# Patient Record
Sex: Male | Born: 1945
Health system: Southern US, Community
[De-identification: ages and names within clinical notes are randomized; demographics above are authoritative.]

## PROBLEM LIST (undated history)

## (undated) DIAGNOSIS — N400 Enlarged prostate without lower urinary tract symptoms: Secondary | ICD-10-CM

## (undated) DIAGNOSIS — T7840XA Allergy, unspecified, initial encounter: Secondary | ICD-10-CM

## (undated) DIAGNOSIS — L719 Rosacea, unspecified: Secondary | ICD-10-CM

## (undated) DIAGNOSIS — J300 Vasomotor rhinitis: Secondary | ICD-10-CM

## (undated) DIAGNOSIS — H905 Unspecified sensorineural hearing loss: Secondary | ICD-10-CM

## (undated) DIAGNOSIS — H269 Unspecified cataract: Secondary | ICD-10-CM

## (undated) DIAGNOSIS — M1990 Unspecified osteoarthritis, unspecified site: Secondary | ICD-10-CM

## (undated) DIAGNOSIS — I341 Nonrheumatic mitral (valve) prolapse: Secondary | ICD-10-CM

## (undated) DIAGNOSIS — M720 Palmar fascial fibromatosis [Dupuytren]: Secondary | ICD-10-CM

## (undated) DIAGNOSIS — Z9889 Other specified postprocedural states: Secondary | ICD-10-CM

## (undated) DIAGNOSIS — K219 Gastro-esophageal reflux disease without esophagitis: Secondary | ICD-10-CM

## (undated) DIAGNOSIS — R011 Cardiac murmur, unspecified: Secondary | ICD-10-CM

## (undated) DIAGNOSIS — E785 Hyperlipidemia, unspecified: Secondary | ICD-10-CM

## (undated) DIAGNOSIS — I340 Nonrheumatic mitral (valve) insufficiency: Principal | ICD-10-CM

## (undated) DIAGNOSIS — J45909 Unspecified asthma, uncomplicated: Secondary | ICD-10-CM

## (undated) DIAGNOSIS — H0013 Chalazion right eye, unspecified eyelid: Secondary | ICD-10-CM

## (undated) HISTORY — DX: Hyperlipidemia, unspecified: E78.5

## (undated) HISTORY — DX: Benign prostatic hyperplasia without lower urinary tract symptoms: N40.0

## (undated) HISTORY — PX: POLYPECTOMY: SHX149

## (undated) HISTORY — DX: Nonrheumatic mitral (valve) prolapse: I34.1

## (undated) HISTORY — DX: Unspecified asthma, uncomplicated: J45.909

## (undated) HISTORY — DX: Palmar fascial fibromatosis (dupuytren): M72.0

## (undated) HISTORY — DX: Unspecified cataract: H26.9

## (undated) HISTORY — PX: OTHER SURGICAL HISTORY: SHX169

## (undated) HISTORY — DX: Gastro-esophageal reflux disease without esophagitis: K21.9

## (undated) HISTORY — DX: Vasomotor rhinitis: J30.0

## (undated) HISTORY — DX: Chalazion right eye, unspecified eyelid: H00.13

## (undated) HISTORY — DX: Allergy, unspecified, initial encounter: T78.40XA

## (undated) HISTORY — DX: Nonrheumatic mitral (valve) insufficiency: I34.0

## (undated) HISTORY — DX: Unspecified sensorineural hearing loss: H90.5

## (undated) HISTORY — DX: Unspecified osteoarthritis, unspecified site: M19.90

---
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
## 1986-09-21 HISTORY — PX: NASAL SEPTUM SURGERY: SHX37

## 1992-09-21 HISTORY — PX: WISDOM TOOTH EXTRACTION: SHX21

## 1995-09-22 HISTORY — PX: OTHER SURGICAL HISTORY: SHX169

## 1996-09-21 HISTORY — PX: SEPTOPLASTY: SUR1290

## 1999-09-22 HISTORY — PX: HAND SURGERY: SHX662

## 2003-08-31 ENCOUNTER — Ambulatory Visit (HOSPITAL_COMMUNITY): Admission: RE | Admit: 2003-08-31 | Discharge: 2003-08-31 | Payer: Self-pay | Admitting: Urology

## 2003-08-31 ENCOUNTER — Ambulatory Visit (HOSPITAL_BASED_OUTPATIENT_CLINIC_OR_DEPARTMENT_OTHER): Admission: RE | Admit: 2003-08-31 | Discharge: 2003-08-31 | Payer: Self-pay | Admitting: Urology

## 2005-07-31 ENCOUNTER — Encounter: Admission: RE | Admit: 2005-07-31 | Discharge: 2005-07-31 | Payer: Self-pay | Admitting: Orthopedic Surgery

## 2005-09-10 ENCOUNTER — Encounter (INDEPENDENT_AMBULATORY_CARE_PROVIDER_SITE_OTHER): Payer: Self-pay | Admitting: *Deleted

## 2005-09-10 ENCOUNTER — Ambulatory Visit (HOSPITAL_COMMUNITY): Admission: RE | Admit: 2005-09-10 | Discharge: 2005-09-10 | Payer: Self-pay | Admitting: Orthopedic Surgery

## 2005-09-10 ENCOUNTER — Ambulatory Visit (HOSPITAL_BASED_OUTPATIENT_CLINIC_OR_DEPARTMENT_OTHER): Admission: RE | Admit: 2005-09-10 | Discharge: 2005-09-10 | Payer: Self-pay | Admitting: Orthopedic Surgery

## 2005-09-21 HISTORY — PX: COLONOSCOPY: SHX174

## 2006-05-17 ENCOUNTER — Ambulatory Visit: Payer: Self-pay | Admitting: Family Medicine

## 2006-05-31 ENCOUNTER — Ambulatory Visit: Payer: Self-pay | Admitting: Internal Medicine

## 2006-07-05 ENCOUNTER — Encounter: Payer: Self-pay | Admitting: Internal Medicine

## 2006-07-05 ENCOUNTER — Ambulatory Visit: Payer: Self-pay | Admitting: Internal Medicine

## 2006-07-05 LAB — HM COLONOSCOPY

## 2007-05-27 ENCOUNTER — Ambulatory Visit: Payer: Self-pay | Admitting: Family Medicine

## 2008-08-24 ENCOUNTER — Ambulatory Visit: Payer: Self-pay | Admitting: Family Medicine

## 2009-08-26 ENCOUNTER — Ambulatory Visit: Payer: Self-pay | Admitting: Family Medicine

## 2009-12-03 ENCOUNTER — Ambulatory Visit: Payer: Self-pay | Admitting: Family Medicine

## 2010-07-31 ENCOUNTER — Ambulatory Visit: Payer: Self-pay | Admitting: Family Medicine

## 2010-09-01 ENCOUNTER — Ambulatory Visit: Payer: Self-pay | Admitting: Family Medicine

## 2010-12-29 ENCOUNTER — Ambulatory Visit (INDEPENDENT_AMBULATORY_CARE_PROVIDER_SITE_OTHER): Payer: BC Managed Care – HMO | Admitting: Family Medicine

## 2010-12-29 DIAGNOSIS — N410 Acute prostatitis: Secondary | ICD-10-CM

## 2010-12-29 DIAGNOSIS — M545 Low back pain: Secondary | ICD-10-CM

## 2011-02-06 NOTE — Op Note (Signed)
NAME:  Andrew Ayala, Andrew Ayala             ACCOUNT NO.:  [id number]   MEDICAL RECORD NO.:  [id number]          PATIENT TYPE:  AMB   LOCATION:  [facility]                          FACILITY:  [facility]   PHYSICIAN:  Cindee Salt, M.D.       DATE OF BIRTH:  Jun 03, 1946   DATE OF PROCEDURE:  09/10/2005  DATE OF DISCHARGE:                                 OPERATIVE REPORT   PREOPERATIVE DIAGNOSIS:  Mass, right palm.   POSTOPERATIVE DIAGNOSIS:  Mass, right palm.   OPERATION PERFORMED:  Excision of mass, right palm.   SURGEON:  Cindee Salt, M.D.   ASSISTANT:  __________   ANESTHESIA:  __________  IV regional.   HISTORY:  The patient is a 65 year old male with a history of a mass in the  palm of his right hand.  This appeared solid during workup.  It is likely an  early Dupuytren's contracture.  He desires removal being aware of risks and  complications including injuries to arteries, nerves and tendons, the  possibility of exacerbation of a Dupuytren's if this is indeed a Dupuytren's  nodule.   DESCRIPTION OF THE OPERATION:  The patient was brought to the operating room  and his hand was marked by both the patient and the surgeon.  He was given  an __________  IV regional anesthetic.  The patient was prepped and draped  using DuraPrep in the supine position with the right arm free.  A volar  Brunner type incision was made over the mass.  This was carried down through  the subcutaneous tissue.  The mass was found to be in the palmar fascia.  This was quite large.  There was a small area of superficial nerve, which  entered into the mass.  This had to be sacrificed __________  be dissected  free.  The palmar fascia was isolated proximally.  This was transected.  This was also transected distally.  The mass involved the fascia going to  the ring and little fingers.  The neurovascular bundle to each finger was  protected.  The mass was excised in toto and sent to pathology.   No further lesions were  identified.  The wound was irrigated.  The skin was  closed with interrupted 5-0 nylon sutures.  A soft compressive dressing was  applied.   The patient tolerated the procedure well and was taken to the recovery room  for observation in satisfactory condition.  He will be discharged home on  Vicodin.  He is to return to the office in one week.           ______________________________  Cindee Salt, M.D.     GK/MEDQ  D:  09/10/2005  T:  09/12/2005  Job:  [id number]

## 2011-02-06 NOTE — Op Note (Signed)
NAME:  Andrew Ayala, Andrew Ayala                         ACCOUNT NO.:  [id number]   MEDICAL RECORD NO.:  [id number]                   PATIENT TYPE:  AMB   LOCATION:  [facility]                                 FACILITY:  [facility]   PHYSICIAN:  Bertram Millard. Dahlstedt, M.D.          DATE OF BIRTH:  1945/12/12   DATE OF PROCEDURE:  08/31/2003  DATE OF DISCHARGE:                                 OPERATIVE REPORT   PREOPERATIVE DIAGNOSIS:  Large penile condylomata.   POSTOPERATIVE DIAGNOSIS:  Large penile condylomata.   PRINCIPAL PROCEDURE:  CO2 laser of penile condylomata.   SURGEON:  Bertram Millard. Dahlstedt, M.D.   ANESTHESIA:  Local with MAC.   COMPLICATIONS:  None.   BRIEF HISTORY:  A 65 year old male, whom I treated several years ago for a  large cluster of condylomata on his penis.  He returned recently with  recurrence of these.  He has large, flat condylomatous area approximately  1.5 cm in diameter on the left proximal shaft of his penis.  As local  treatment with cryotherapy before failed, I recommended CO2 laser of this  area.  Risks and complications of this procedure were discussed with the  patient, who has desired to proceed.   DESCRIPTION OF PROCEDURE:  The patient was administered IV sedation, and  then in the area underneath the condylomata was infiltrated with a 50/50  solution of 0.5% plain Marcaine and 1% plain lidocaine.  Acetic acid was  then applied to the penis for a few minutes.  No other acetowhite lesions  were seen.  I then used the CO2 laser to ablate the skin lesions on the left  proximal penis.  There was a large lesion there that was also incorporated  in this cautery.  This was taken down to the subcutaneous layers with the  CO2 laser.  Several small bleeders were coagulated with the laser.  At this  point, after all the lesions were gone, the skin was dressed with Neosporin,  Vaseline gauze, and Kerlix wrap, and Coban.  The patient tolerated the  procedure well.  He was  awakened and taken to the PACU in stable condition.  He was sent home with dressing instructions as well as 15 Vicodin tablets 1  p.o. q.4h. p.r.n. pain.                                               Bertram Millard. Dahlstedt, M.D.    SMD/MEDQ  D:  08/31/2003  T:  08/31/2003  Job:  [id number]

## 2011-02-10 ENCOUNTER — Other Ambulatory Visit: Payer: Self-pay | Admitting: Family Medicine

## 2011-02-11 MED ORDER — VARDENAFIL HCL 20 MG PO TABS: 20.0000 mg | ORAL_TABLET | ORAL | Status: DC | PRN

## 2011-02-11 NOTE — Telephone Encounter (Signed)
Levitra renewed 

## 2011-04-12 ENCOUNTER — Other Ambulatory Visit: Payer: Self-pay | Admitting: Family Medicine

## 2011-07-11 ENCOUNTER — Other Ambulatory Visit: Payer: Self-pay | Admitting: Family Medicine

## 2011-07-13 ENCOUNTER — Other Ambulatory Visit: Payer: Self-pay | Admitting: Family Medicine

## 2011-07-13 MED ORDER — ATORVASTATIN CALCIUM 10 MG PO TABS: 10.0000 mg | ORAL_TABLET | Freq: Every day | ORAL | Status: DC

## 2011-07-13 NOTE — Telephone Encounter (Signed)
Phoned refilled Lipitor 10 mg   #30  2 refills to [facility] [phone]

## 2011-10-13 ENCOUNTER — Other Ambulatory Visit: Payer: Self-pay | Admitting: Family Medicine

## 2012-01-15 ENCOUNTER — Other Ambulatory Visit: Payer: Self-pay | Admitting: Family Medicine

## 2012-02-13 ENCOUNTER — Other Ambulatory Visit: Payer: Self-pay | Admitting: Family Medicine

## 2012-02-17 ENCOUNTER — Encounter: Payer: Self-pay | Admitting: Internal Medicine

## 2012-02-22 ENCOUNTER — Ambulatory Visit (INDEPENDENT_AMBULATORY_CARE_PROVIDER_SITE_OTHER): Payer: BC Managed Care – HMO | Admitting: Family Medicine

## 2012-02-22 ENCOUNTER — Encounter: Payer: Self-pay | Admitting: Internal Medicine

## 2012-02-22 ENCOUNTER — Encounter: Payer: Self-pay | Admitting: Family Medicine

## 2012-02-22 VITALS — BP 100/70 | HR 68 | Ht 75.0 in | Wt 194.0 lb

## 2012-02-22 DIAGNOSIS — Z860100 Personal history of colon polyps, unspecified: Secondary | ICD-10-CM | POA: Insufficient documentation

## 2012-02-22 DIAGNOSIS — J309 Allergic rhinitis, unspecified: Secondary | ICD-10-CM

## 2012-02-22 DIAGNOSIS — N400 Enlarged prostate without lower urinary tract symptoms: Secondary | ICD-10-CM | POA: Insufficient documentation

## 2012-02-22 DIAGNOSIS — I341 Nonrheumatic mitral (valve) prolapse: Secondary | ICD-10-CM | POA: Insufficient documentation

## 2012-02-22 DIAGNOSIS — I059 Rheumatic mitral valve disease, unspecified: Secondary | ICD-10-CM

## 2012-02-22 DIAGNOSIS — N529 Male erectile dysfunction, unspecified: Secondary | ICD-10-CM

## 2012-02-22 DIAGNOSIS — Z0000 Encounter for general adult medical examination without abnormal findings: Secondary | ICD-10-CM

## 2012-02-22 DIAGNOSIS — E785 Hyperlipidemia, unspecified: Secondary | ICD-10-CM

## 2012-02-22 LAB — POCT URINALYSIS DIPSTICK
Bilirubin, UA: NEGATIVE
Blood, UA: NEGATIVE
Glucose, UA: NEGATIVE
Ketones, UA: NEGATIVE
Leukocytes, UA: NEGATIVE
Nitrite, UA: NEGATIVE
Protein, UA: NEGATIVE
Spec Grav, UA: 1.02
Urobilinogen, UA: NEGATIVE
pH, UA: 7

## 2012-02-22 MED ORDER — FINASTERIDE 5 MG PO TABS: 5.0000 mg | ORAL_TABLET | Freq: Every day | ORAL | Status: DC

## 2012-02-22 MED ORDER — ATORVASTATIN CALCIUM 10 MG PO TABS: 10.0000 mg | ORAL_TABLET | Freq: Every day | ORAL | Status: DC

## 2012-02-22 NOTE — Progress Notes (Signed)
Subjective:    Patient ID: Andrew Ayala, male    DOB: [birth year], 66 y.o.   MRN: [id number]  HPI He is here for a complete examination. He does have a history of colonic polyps and does need a followup colonoscopy. He has had intermittent difficulty with erectile dysfunction but none recently. He has had difficulty with prostate symptoms especially with nocturia x4, hesitancy and decreased stream. His allergies seem to be under good control. He continues on his statin drug and is having no difficulty with that. He does have a history of MVP/MR and did see cardiology several years ago. He was told that followup would be on an as-needed basis. His immunizations were reviewed. He has no other concerns or questions.   Review of Systems Negative except as above    Objective:   Physical Exam BP 100/70  Pulse 68  Ht 6\' 3"  (1.905 m)  Wt 194 lb (87.998 kg)  BMI 24.25 kg/m2  General Appearance:    Alert, cooperative, no distress, appears stated age  Head:    Normocephalic, without obvious abnormality, atraumatic  Eyes:    PERRL, conjunctiva/corneas clear, EOM's intact, fundi    benign  Ears:    Normal TM's and external ear canals  Nose:   Nares normal, mucosa normal, no drainage or sinus   tenderness  Throat:   Lips, mucosa, and tongue normal; teeth and gums normal  Neck:   Supple, no lymphadenopathy;  thyroid:  no   enlargement/tenderness/nodules; no carotid   bruit or JVD  Back:    Spine nontender, no curvature, ROM normal, no CVA     tenderness  Lungs:     Clear to auscultation bilaterally without wheezes, rales or     ronchi; respirations unlabored  Chest Wall:    No tenderness or deformity   Heart:    Regular rate and rhythm, S1 and S2 normal, 3/6 systolic murmur with out S2 him in no rub or gallop   or gallop  Breast Exam:    No chest wall tenderness, masses or gynecomastia  Abdomen:     Soft, non-tender, nondistended, normoactive bowel sounds,    no masses, no hepatosplenomegaly    Genitalia:    Normal male external genitalia without lesions.  Testicles without masses.  No inguinal hernias.  Rectal:    Normal sphincter tone, no masses or tenderness; guaiac negative stool.  Prostate smooth, no nodules, not enlarged.  Extremities:   No clubbing, cyanosis or edema  Pulses:   2+ and symmetric all extremities  Skin:   Skin color, texture, turgor normal, no rashes or lesions  Lymph nodes:   Cervical, supraclavicular, and axillary nodes normal  Neurologic:   CNII-XII intact, normal strength, sensation and gait; reflexes 2+ and symmetric throughout          Psych:   Normal mood, affect, hygiene and grooming.           Assessment & Plan:   1. Routine general medical examination at a health care facility  POCT Urinalysis Dipstick, CBC with Differential, Comprehensive metabolic panel, Lipid panel  2. Hx of colonic polyps  [facility] COLONOSCOPY  3. ED (erectile dysfunction)    4. MVP (mitral valve prolapse)    5. Allergic rhinitis, mild    6. BPH (benign prostatic hyperplasia)  PSA, finasteride (PROSCAR) 5 MG tablet  7. Hyperlipidemia LDL goal < 100  Lipid panel, atorvastatin (LIPITOR) 10 MG tablet   he will call and let me know how the finasteride  is working for his bladder issues. I also discussed low possibility of further difficulty with erectile dysfunction.

## 2012-02-23 LAB — CBC WITH DIFFERENTIAL/PLATELET
Basophils Absolute: 0.1 10*3/uL (ref 0.0–0.1)
Basophils Relative: 1 % (ref 0–1)
Eosinophils Relative: 8 % — ABNORMAL HIGH (ref 0–5)
HCT: 44.1 % (ref 39.0–52.0)
Hemoglobin: 15 g/dL (ref 13.0–17.0)
Lymphocytes Relative: 37 % (ref 12–46)
Lymphs Abs: 2.2 10*3/uL (ref 0.7–4.0)
MCHC: 34 g/dL (ref 30.0–36.0)
MCV: 96.1 fL (ref 78.0–100.0)
Monocytes Absolute: 0.6 10*3/uL (ref 0.1–1.0)
Monocytes Relative: 10 % (ref 3–12)
Neutro Abs: 2.6 10*3/uL (ref 1.7–7.7)
Neutrophils Relative %: 44 % (ref 43–77)
Platelets: 168 10*3/uL (ref 150–400)
RBC: 4.59 MIL/uL (ref 4.22–5.81)
WBC: 5.9 10*3/uL (ref 4.0–10.5)

## 2012-02-23 LAB — LIPID PANEL
Cholesterol: 156 mg/dL (ref 0–200)
HDL: 37 mg/dL — ABNORMAL LOW (ref >39)
LDL Cholesterol: 83 mg/dL (ref 0–99)
Total CHOL/HDL Ratio: 4.2 Ratio
Triglycerides: 179 mg/dL — ABNORMAL HIGH (ref <150)
VLDL: 36 mg/dL (ref 0–40)

## 2012-02-23 LAB — COMPREHENSIVE METABOLIC PANEL
ALT: 26 U/L (ref 0–53)
AST: 25 U/L (ref 0–37)
Albumin: 4.3 g/dL (ref 3.5–5.2)
BUN: 15 mg/dL (ref 6–23)
CO2: 28 mEq/L (ref 19–32)
Calcium: 9.2 mg/dL (ref 8.4–10.5)
Chloride: 103 mEq/L (ref 96–112)
Creat: 1.02 mg/dL (ref 0.50–1.35)
Glucose, Bld: 79 mg/dL (ref 70–99)
Potassium: 4 mEq/L (ref 3.5–5.3)
Sodium: 142 mEq/L (ref 135–145)
Total Bilirubin: 0.8 mg/dL (ref 0.3–1.2)
Total Protein: 7.1 g/dL (ref 6.0–8.3)

## 2012-02-23 LAB — PSA: PSA: 0.41 ng/mL (ref <=4.00)

## 2012-02-23 NOTE — Progress Notes (Signed)
Quick Note:  The blood work is normal ______ 

## 2012-03-15 ENCOUNTER — Other Ambulatory Visit: Payer: Self-pay | Admitting: Family Medicine

## 2012-03-21 ENCOUNTER — Encounter: Payer: Self-pay | Admitting: Internal Medicine

## 2012-03-21 ENCOUNTER — Ambulatory Visit (AMBULATORY_SURGERY_CENTER): Payer: BC Managed Care – HMO | Admitting: *Deleted

## 2012-03-21 VITALS — Ht 75.0 in | Wt 193.1 lb

## 2012-03-21 DIAGNOSIS — Z1211 Encounter for screening for malignant neoplasm of colon: Secondary | ICD-10-CM

## 2012-03-21 MED ORDER — MOVIPREP 100 G PO SOLR: ORAL | Status: DC

## 2012-03-21 NOTE — Progress Notes (Signed)
No allergies to eggs or soybeans.  No complication of anesthesia; no difficult intubation  

## 2012-04-04 ENCOUNTER — Ambulatory Visit (AMBULATORY_SURGERY_CENTER): Payer: BC Managed Care – HMO | Admitting: Internal Medicine

## 2012-04-04 ENCOUNTER — Encounter: Payer: Self-pay | Admitting: Internal Medicine

## 2012-04-04 VITALS — BP 119/70 | HR 52 | Temp 95.9°F | Resp 16 | Ht 75.0 in | Wt 193.0 lb

## 2012-04-04 DIAGNOSIS — D126 Benign neoplasm of colon, unspecified: Secondary | ICD-10-CM

## 2012-04-04 DIAGNOSIS — Z1211 Encounter for screening for malignant neoplasm of colon: Secondary | ICD-10-CM

## 2012-04-04 DIAGNOSIS — Z86010 Personal history of colonic polyps: Secondary | ICD-10-CM

## 2012-04-04 MED ORDER — SODIUM CHLORIDE 0.9 % IV SOLN: 500.0000 mL | INTRAVENOUS | Status: DC

## 2012-04-04 NOTE — Patient Instructions (Addendum)
YOU HAD AN ENDOSCOPIC PROCEDURE TODAY AT THE [facility]: Refer to the procedure report that was given to you for any specific questions about what was found during the examination.  If the procedure report does not answer your questions, please call your gastroenterologist to clarify.  If you requested that your care partner not be given the details of your procedure findings, then the procedure report has been included in a sealed envelope for you to review at your convenience later.  YOU SHOULD EXPECT: Some feelings of bloating in the abdomen. Passage of more gas than usual.  Walking can help get rid of the air that was put into your GI tract during the procedure and reduce the bloating. If you had a lower endoscopy (such as a colonoscopy or flexible sigmoidoscopy) you may notice spotting of blood in your stool or on the toilet paper. If you underwent a bowel prep for your procedure, then you may not have a normal bowel movement for a few days.  DIET: Your first meal following the procedure should be a light meal and then it is ok to progress to your normal diet.  A half-sandwich or bowl of soup is an example of a good first meal.  Heavy or fried foods are harder to digest and may make you feel nauseous or bloated.  Likewise meals heavy in dairy and vegetables can cause extra gas to form and this can also increase the bloating.  Drink plenty of fluids but you should avoid alcoholic beverages for 24 hours.  ACTIVITY: Your care partner should take you home directly after the procedure.  You should plan to take it easy, moving slowly for the rest of the day.  You can resume normal activity the day after the procedure however you should NOT DRIVE or use heavy machinery for 24 hours (because of the sedation medicines used during the test).    SYMPTOMS TO REPORT IMMEDIATELY: A gastroenterologist can be reached at any hour.  During normal business hours, 8:30 AM to 5:00 PM Monday through Friday,  call [phone].  After hours and on weekends, please call the GI answering service at [phone] who will take a message and have the physician on call contact you.   Following lower endoscopy (colonoscopy or flexible sigmoidoscopy):  Excessive amounts of blood in the stool  Significant tenderness or worsening of abdominal pains  Swelling of the abdomen that is new, acute  Fever of 100F or higher    FOLLOW UP: If any biopsies were taken you will be contacted by phone or by letter within the next 1-3 weeks.  Call your gastroenterologist if you have not heard about the biopsies in 3 weeks.  Our staff will call the home number listed on your records the next business day following your procedure to check on you and address any questions or concerns that you may have at that time regarding the information given to you following your procedure. This is a courtesy call and so if there is no answer at the home number and we have not heard from you through the emergency physician on call, we will assume that you have returned to your regular daily activities without incident.  SIGNATURES/CONFIDENTIALITY: You and/or your care partner have signed paperwork which will be entered into your electronic medical record.  These signatures attest to the fact that that the information above on your After Visit Summary has been reviewed and is understood.  Full responsibility of the confidentiality   of this discharge information lies with you and/or your care-partner.     

## 2012-04-04 NOTE — Progress Notes (Signed)
Patient did not have preoperative order for IV antibiotic SSI prophylaxis. (G8918)  Patient did not experience any of the following events: a burn prior to discharge; a fall within the facility; wrong site/side/patient/procedure/implant event; or a hospital transfer or hospital admission upon discharge from the facility. (G8907)  

## 2012-04-04 NOTE — Op Note (Signed)
[facility] [address] [place], [place]  [address]  COLONOSCOPY PROCEDURE REPORT  PATIENT:  Andrew Ayala, Andrew Ayala  MR#:  [id number] BIRTHDATE:  05/17/1946, 65 yrs. old  GENDER:  male ENDOSCOPIST:  Wilhemina Bonito. Eda Keys, MD REF. BY:  Surveillance Program Recall, PROCEDURE DATE:  04/04/2012 PROCEDURE:  Colonoscopy with snare polypectomy x 1 ASA CLASS:  Class II INDICATIONS:  history of pre-cancerous (adenomatous) colon polyps, surveillance and high-risk screening ; index exam 06-2006 w/ small adenomas MEDICATIONS:   MAC sedation, administered by CRNA, propofol (Diprivan) 300 mg IV  DESCRIPTION OF PROCEDURE:   After the risks benefits and alternatives of the procedure were thoroughly explained, informed consent was obtained.  Digital rectal exam was performed and revealed no abnormalities.   The LB CF-[id number] endoscope was introduced through the anus and advanced to the cecum, which was identified by both the appendix and ileocecal valve, without limitations.  The quality of the prep was good, using MoviPrep. The instrument was then slowly withdrawn as the colon was fully examined. <<PROCEDUREIMAGES>>  FINDINGS:A diminutive polyp was found in the proximal transverse colon and was snared without cautery.Retrieval was successful. Otherwise normal colonoscopy without other polyps, masses, vascular ectasias, or inflammatory changes.  Retroflexed views in the rectum revealed no abnormalities.  The time to cecum = 7:52 minutes. The scope was then withdrawn in 12:55  minutes from the cecum and the procedure completed.  COMPLICATIONS:  None  ENDOSCOPIC IMPRESSION: 1) Diminutive polyp in the proximal transverse colon - removed 2) Otherwise normal colonoscopy  RECOMMENDATIONS: 1) Follow up colonoscopy in 5 years  ______________________________ Wilhemina Bonito. Eda Keys, MD  CC:  Sharlot Gowda, MD;  The Patient  n. eSIGNED:   Wilhemina Bonito. Eda Keys at 04/04/2012 02:15 PM  Rosita Kea, [phone]

## 2012-04-05 ENCOUNTER — Telehealth: Payer: Self-pay | Admitting: *Deleted

## 2012-04-05 NOTE — Telephone Encounter (Signed)
Left message that called for f/u 

## 2012-04-11 ENCOUNTER — Encounter: Payer: Self-pay | Admitting: Internal Medicine

## 2012-05-05 ENCOUNTER — Other Ambulatory Visit: Payer: Self-pay | Admitting: Family Medicine

## 2012-05-06 NOTE — Telephone Encounter (Signed)
Rx refill for Levitra

## 2012-09-12 ENCOUNTER — Other Ambulatory Visit: Payer: Self-pay | Admitting: Family Medicine

## 2012-11-18 ENCOUNTER — Ambulatory Visit (INDEPENDENT_AMBULATORY_CARE_PROVIDER_SITE_OTHER): Payer: BC Managed Care – HMO | Admitting: Medical

## 2012-11-18 ENCOUNTER — Encounter: Payer: Self-pay | Admitting: Medical

## 2012-11-18 VITALS — BP 102/80 | HR 55 | Temp 98.2°F | Resp 16 | Wt 199.0 lb

## 2012-11-18 DIAGNOSIS — J309 Allergic rhinitis, unspecified: Secondary | ICD-10-CM

## 2012-11-18 DIAGNOSIS — I889 Nonspecific lymphadenitis, unspecified: Secondary | ICD-10-CM

## 2012-11-18 DIAGNOSIS — H1010 Acute atopic conjunctivitis, unspecified eye: Secondary | ICD-10-CM

## 2012-11-18 DIAGNOSIS — J300 Vasomotor rhinitis: Secondary | ICD-10-CM

## 2012-11-18 DIAGNOSIS — H1013 Acute atopic conjunctivitis, bilateral: Secondary | ICD-10-CM

## 2012-11-18 MED ORDER — AZELASTINE-FLUTICASONE 137-50 MCG/ACT NA SUSP: 1.0000 | Freq: Two times a day (BID) | NASAL | Status: DC

## 2012-11-18 NOTE — Patient Instructions (Signed)
Vasomotor rhinitis -    Begin Dymista nasal spray 1 spray twice daily  Increase water intake  You can also consider Benadryl or Zyrtec at bedtime. However, if you feel to dry in the mucous membranes, then either back off the nasal spray or allergy pill  You can use salt water gargles to clear mucous out of the throat  Use warm fluids like tea or coffee to help with the voice/throat symptoms  Voice rest  If you are worsening, call back

## 2012-11-18 NOTE — Progress Notes (Signed)
Subjective:  Andrew Ayala is a 67 y.o. male who presents for 4-5 day hx/o cold symptoms, with clear frequent runny nose, post nasal drip, cough spells, stuffy nose, lots of sneezing, itchy watery eyes, glands swollen.  Thinks he needs antibiotic for sinus infection.  Denies fever, NVD, sinus pressure, no chest congestion.   Using Tylenol.  No other aggravating or relieving factors.  No other c/o.  The following portions of the patient's history were reviewed and updated as appropriate: allergies, current medications, past family history, past medical history, past social history, past surgical history and problem list.  ROS as in subjective  Objective: Filed Vitals:   11/18/12 1556  BP: 102/80  Pulse: 55  Temp: 98.2 F (36.8 C)  Resp: 16    General appearance: Alert, WD/WN, no distress                             Skin: warm, no rash                           Head: no sinus tenderness                            Eyes: conjunctiva normal, corneas clear, PERRLA                            Ears: pearly TMs, external ear canals normal                          Nose: septum midline, turbinates swollen, no erythema,clear discharge                  Mouth/throat: MMM, tongue normal, no pharyngeal erythema or exudate                           Neck: supple, shoddy tender anterior lymph nodes, no thyromegaly, non tender                          Heart: RRR, normal S1, S2, no murmurs                         Lungs: CTA bilaterally, no wheezes, rales, or rhonchi   Assessment:   Encounter Diagnoses  Name Primary?  . Vasomotor rhinitis Yes  . Lymphadenitis   . Allergic conjunctivitis and rhinitis, bilateral     Plan:   Patient Instructions  Vasomotor rhinitis -    Begin Dymista nasal spray 1 spray twice daily  Increase water intake  You can also consider Benadryl or Zyrtec at bedtime. However, if you feel to dry in the mucous membranes, then either back off the nasal spray or allergy  pill  You can use salt water gargles to clear mucous out of the throat  Use warm fluids like tea or coffee to help with the voice/throat symptoms  Voice rest  If you are worsening, call back

## 2013-02-21 ENCOUNTER — Other Ambulatory Visit: Payer: Self-pay | Admitting: Family Medicine

## 2013-03-22 ENCOUNTER — Other Ambulatory Visit: Payer: Self-pay | Admitting: Family Medicine

## 2013-03-31 ENCOUNTER — Encounter: Payer: Self-pay | Admitting: Family Medicine

## 2013-03-31 ENCOUNTER — Ambulatory Visit (INDEPENDENT_AMBULATORY_CARE_PROVIDER_SITE_OTHER): Payer: BC Managed Care – HMO | Admitting: Family Medicine

## 2013-03-31 VITALS — BP 110/60 | HR 64 | Temp 97.7°F | Wt 199.0 lb

## 2013-03-31 DIAGNOSIS — H6980 Other specified disorders of Eustachian tube, unspecified ear: Secondary | ICD-10-CM

## 2013-03-31 DIAGNOSIS — H6981 Other specified disorders of Eustachian tube, right ear: Secondary | ICD-10-CM

## 2013-03-31 NOTE — Progress Notes (Signed)
  Subjective:    Patient ID: Andrew Ayala, male    DOB: 02/01/1946, 67 y.o.   MRN: [id number]  HPI He has a three-week history of right ear fullness and ringing but no sore throat, cough, fever, earache, nasal congestion, PND he has tried intermittent use of Sudafed without success.He does have a history of bilateral tear duct blocking..  Review of Systems     Objective:   Physical Exam alert and in no distress. Tympanic membranes and canals are normal. Throat is clear. Tonsils are normal. Neck is supple without adenopathy or thyromegaly. Cardiac exam shows a regular sinus rhythm without murmurs or gallops. Lungs are clear to auscultation.        Assessment & Plan:  Eustachian tube dysfunction, right  recommend Afrin nasal spray for 3 or 4 days and if no improvement then switch to a decongestant and to check with the pharmacist some warmth less likely to cause sleep disturbance

## 2013-03-31 NOTE — Patient Instructions (Signed)
Use Afrin nasal spray regularly for the next 3 or 4 days. If that doesn't work then you can switched to Sudafed or the other decongestant

## 2013-04-19 ENCOUNTER — Other Ambulatory Visit: Payer: Self-pay | Admitting: Family Medicine

## 2013-04-24 ENCOUNTER — Ambulatory Visit: Payer: BC Managed Care – HMO | Admitting: Family Medicine

## 2013-04-25 ENCOUNTER — Ambulatory Visit (INDEPENDENT_AMBULATORY_CARE_PROVIDER_SITE_OTHER): Payer: BC Managed Care – HMO | Admitting: Medical

## 2013-04-25 ENCOUNTER — Encounter: Payer: Self-pay | Admitting: Medical

## 2013-04-25 VITALS — BP 98/62 | HR 64 | Temp 97.6°F | Resp 16 | Wt 198.0 lb

## 2013-04-25 DIAGNOSIS — J029 Acute pharyngitis, unspecified: Secondary | ICD-10-CM

## 2013-04-25 DIAGNOSIS — A088 Other specified intestinal infections: Secondary | ICD-10-CM

## 2013-04-25 DIAGNOSIS — A084 Viral intestinal infection, unspecified: Secondary | ICD-10-CM

## 2013-04-25 NOTE — Progress Notes (Signed)
Subjective: He reports illness.  Over weekend started feeling bad.  Saturday 3 days ago, had left sinus drainage, runny nose, sore throat Sunday, and sore throat continues.  Sunday started having strong diarrhea that lasted 24 hours, had approximately 6-7 stools, no blood in stool.  Had some abdominal cramping during that time.  Some slight dizziness, some ear pressure, some body aches.  Denies cough, fever, chills, no NV.  Using aleve for sore throat, no other treatment.  Friday he did hold a baby that had a cold, not sure if he picked up something from the baby.   He was out of town in [place] last week, on plane trips, not sure what other exposures he may have had.  Has also had some acid reflux.  Has been eating different foods over the weekend.  Past Medical History  Diagnosis Date  . Sensory - neural hearing loss   . Vasomotor rhinitis   . Allergic rhinitis   . MVP (mitral valve prolapse)   . BPH (benign prostatic hyperplasia)   . Hyperlipidemia    ROS as in subjective  Objective: Filed Vitals:   04/25/13 0948  BP: 98/62  Pulse: 64  Temp: 97.6 F (36.4 C)  Resp: 16    General appearance: alert, no distress, WD/WN HEENT: normocephalic, sclerae anicteric, TMs pearly, nares patent, no discharge or erythema, pharynx with erythema, some frothy sputum in pharynx Oral cavity: MMM, no lesions Neck: supple, shoddy tender anterior nodes, no thyromegaly, no masses Heart: RRR, normal S1, S2, no murmurs Lungs: CTA bilaterally, no wheezes, rhonchi, or rales Abdomen: +bs, soft, non tender, non distended, no masses, no hepatomegaly, no splenomegaly   Assessment: Encounter Diagnoses  Name Primary?  . Sore throat Yes  . Viral gastroenteritis      Plan: Sore throat - strep negative, possibly combination of viral pharyngitis and acid reflux.   Discussed supportive care, avoid GERD triggers, and if worse or not improving in the next several days, call or return.  Viral gastroenteritis -  resolved  Follow-up prn

## 2013-04-26 NOTE — Addendum Note (Signed)
Addended by: Leretha Dykes L on: 04/26/2013 10:45 AM   Modules accepted: Orders

## 2013-05-21 ENCOUNTER — Other Ambulatory Visit: Payer: Self-pay | Admitting: Family Medicine

## 2013-06-18 ENCOUNTER — Other Ambulatory Visit: Payer: Self-pay | Admitting: Family Medicine

## 2013-07-20 ENCOUNTER — Other Ambulatory Visit: Payer: Self-pay | Admitting: Family Medicine

## 2013-08-19 ENCOUNTER — Other Ambulatory Visit: Payer: Self-pay | Admitting: Family Medicine

## 2013-08-25 ENCOUNTER — Encounter: Payer: Self-pay | Admitting: Family Medicine

## 2013-08-25 ENCOUNTER — Ambulatory Visit (INDEPENDENT_AMBULATORY_CARE_PROVIDER_SITE_OTHER): Payer: BC Managed Care – HMO | Admitting: Family Medicine

## 2013-08-25 VITALS — BP 120/80 | HR 72 | Ht 75.0 in | Wt 197.0 lb

## 2013-08-25 DIAGNOSIS — Z0000 Encounter for general adult medical examination without abnormal findings: Secondary | ICD-10-CM

## 2013-08-25 DIAGNOSIS — N400 Enlarged prostate without lower urinary tract symptoms: Secondary | ICD-10-CM

## 2013-08-25 DIAGNOSIS — E785 Hyperlipidemia, unspecified: Secondary | ICD-10-CM

## 2013-08-25 DIAGNOSIS — J309 Allergic rhinitis, unspecified: Secondary | ICD-10-CM

## 2013-08-25 DIAGNOSIS — Z860100 Personal history of colon polyps, unspecified: Secondary | ICD-10-CM

## 2013-08-25 DIAGNOSIS — I059 Rheumatic mitral valve disease, unspecified: Secondary | ICD-10-CM

## 2013-08-25 DIAGNOSIS — I341 Nonrheumatic mitral (valve) prolapse: Secondary | ICD-10-CM

## 2013-08-25 DIAGNOSIS — Z23 Encounter for immunization: Secondary | ICD-10-CM

## 2013-08-25 DIAGNOSIS — N529 Male erectile dysfunction, unspecified: Secondary | ICD-10-CM

## 2013-08-25 LAB — COMPREHENSIVE METABOLIC PANEL
AST: 21 U/L (ref 0–37)
Albumin: 4.4 g/dL (ref 3.5–5.2)
BUN: 17 mg/dL (ref 6–23)
CO2: 29 mEq/L (ref 19–32)
Calcium: 9.1 mg/dL (ref 8.4–10.5)
Chloride: 100 mEq/L (ref 96–112)
Glucose, Bld: 78 mg/dL (ref 70–99)
Potassium: 4.1 mEq/L (ref 3.5–5.3)
Sodium: 138 mEq/L (ref 135–145)
Total Protein: 7.4 g/dL (ref 6.0–8.3)

## 2013-08-25 LAB — POCT URINALYSIS DIPSTICK
Bilirubin, UA: NEGATIVE
Blood, UA: NEGATIVE
Ketones, UA: NEGATIVE
Leukocytes, UA: NEGATIVE
Protein, UA: NEGATIVE
Spec Grav, UA: 1.015
pH, UA: 5

## 2013-08-25 LAB — CBC WITH DIFFERENTIAL/PLATELET
Basophils Relative: 1 % (ref 0–1)
HCT: 43.7 % (ref 39.0–52.0)
Hemoglobin: 15.4 g/dL (ref 13.0–17.0)
Lymphocytes Relative: 27 % (ref 12–46)
Lymphs Abs: 1.8 10*3/uL (ref 0.7–4.0)
MCHC: 35.2 g/dL (ref 30.0–36.0)
MCV: 94.2 fL (ref 78.0–100.0)
Monocytes Absolute: 0.6 10*3/uL (ref 0.1–1.0)
Monocytes Relative: 9 % (ref 3–12)
Neutro Abs: 3.9 10*3/uL (ref 1.7–7.7)
RBC: 4.64 MIL/uL (ref 4.22–5.81)
RDW: 13.3 % (ref 11.5–15.5)

## 2013-08-25 LAB — LIPID PANEL
Cholesterol: 169 mg/dL (ref 0–200)
HDL: 37 mg/dL — ABNORMAL LOW (ref >39)
Triglycerides: 129 mg/dL (ref <150)

## 2013-08-25 MED ORDER — TERAZOSIN HCL 1 MG PO CAPS: 1.0000 mg | ORAL_CAPSULE | Freq: Every day | ORAL | Status: DC

## 2013-08-25 MED ORDER — VARDENAFIL HCL 10 MG PO TABS: 10.0000 mg | ORAL_TABLET | Freq: Every day | ORAL | Status: DC | PRN

## 2013-08-25 NOTE — Progress Notes (Signed)
Subjective:    Patient ID: Andrew Ayala, male    DOB: 11/05/1945, 67 y.o.   MRN: [id number]  HPI He is here for a complete examination. He does have an underlying history of mitral valve prolapse. He has been evaluated in the past by cardiology for this. He does have BPH and has been on Prozac are for this. He notes some urinary urgency and would like this further addressed. He also has frequency during the day and nocturia  x3. He does make an effort to cut back on his fluids. His allergies are under good control. He does have some difficulty with erectile dysfunction and would like a refill on his Levitra. He has a previous history of colonic polyps and does have a colonoscopy. He continues on Lipitor for his hyperlipidemia. His work and home life are going well. He is contemplating retirement after he gets from his bills paid.   Review of Systems Negative except as above    Objective:   Physical Exam BP 120/80  Pulse 72  Ht 6\' 3"  (1.905 m)  Wt 197 lb (89.359 kg)  BMI 24.62 kg/m2  SpO2 98%  General Appearance:    Alert, cooperative, no distress, appears stated age  Head:    Normocephalic, without obvious abnormality, atraumatic  Eyes:    PERRL, conjunctiva/corneas clear, EOM's intact, fundi    benign  Ears:    Normal TM's and external ear canals  Nose:   Nares normal, mucosa normal, no drainage or sinus   tenderness  Throat:   Lips, mucosa, and tongue normal; teeth and gums normal  Neck:   Supple, no lymphadenopathy;  thyroid:  no   enlargement/tenderness/nodules; no carotid   bruit or JVD  Back:    Spine nontender, no curvature, ROM normal, no CVA     tenderness  Lungs:     Clear to auscultation bilaterally without wheezes, rales or     ronchi; respirations unlabored  Chest Wall:    No tenderness or deformity   Heart:    Regular rate and rhythm, S1 and S2 normal, no murmur, rub   or gallop  Breast Exam:    No chest wall tenderness, masses or gynecomastia  Abdomen:     Soft,  non-tender, nondistended, normoactive bowel sounds,    no masses, no hepatosplenomegaly        Extremities:   No clubbing, cyanosis or edema  Pulses:   2+ and symmetric all extremities  Skin:   Skin color, texture, turgor normal, no rashes or lesions  Lymph nodes:   Cervical, supraclavicular, and axillary nodes normal  Neurologic:   CNII-XII intact, normal strength, sensation and gait; reflexes 2+ and symmetric throughout          Psych:   Normal mood, affect, hygiene and grooming.          Assessment & Plan:  Routine general medical examination at a health care facility - Plan: POCT Urinalysis Dipstick, CBC with Differential, Comprehensive metabolic panel, Lipid panel  MVP (mitral valve prolapse) - Plan: CBC with Differential, Comprehensive metabolic panel  Hyperlipidemia LDL goal < 100 - Plan: Lipid panel  Hx of colonic polyps  ED (erectile dysfunction) - Plan: vardenafil (LEVITRA) 10 MG tablet, CBC with Differential, Comprehensive metabolic panel  BPH (benign prostatic hyperplasia) - Plan: terazosin (HYTRIN) 1 MG capsule  Allergic rhinitis, mild - Plan: CBC with Differential, Comprehensive metabolic panel  Need for prophylactic vaccination and inoculation against unspecified single disease - Plan: Pneumococcal conjugate  vaccine 13-valent  he will continue on his present medication regimen with the addition of Hytrin. Discussed the possible side effects. He will call me in one month to let me know how he is doing. Did state that we might need to go higher.. Followup here as needed.

## 2013-09-18 ENCOUNTER — Other Ambulatory Visit: Payer: Self-pay | Admitting: Family Medicine

## 2013-10-06 ENCOUNTER — Telehealth: Payer: Self-pay | Admitting: Family Medicine

## 2013-10-06 MED ORDER — SCOPOLAMINE 1 MG/3DAYS TD PT72: 1.0000 | MEDICATED_PATCH | TRANSDERMAL | Status: DC

## 2013-10-06 NOTE — Telephone Encounter (Signed)
Patient going deep sea fishing and would like Transderm-Scop.

## 2013-11-03 ENCOUNTER — Telehealth: Payer: Self-pay | Admitting: Family Medicine

## 2013-11-03 NOTE — Telephone Encounter (Signed)
Pt called and stated that the medication Terazosin is work well and he hasn't noticed any side effects.

## 2014-03-22 ENCOUNTER — Other Ambulatory Visit: Payer: Self-pay | Admitting: Family Medicine

## 2014-06-21 ENCOUNTER — Encounter: Payer: Self-pay | Admitting: Internal Medicine

## 2014-09-10 ENCOUNTER — Ambulatory Visit (INDEPENDENT_AMBULATORY_CARE_PROVIDER_SITE_OTHER): Payer: BC Managed Care – HMO | Admitting: Family Medicine

## 2014-09-10 ENCOUNTER — Encounter: Payer: Self-pay | Admitting: Family Medicine

## 2014-09-10 VITALS — BP 118/78 | HR 60 | Wt 200.0 lb

## 2014-09-10 DIAGNOSIS — Z566 Other physical and mental strain related to work: Secondary | ICD-10-CM

## 2014-09-10 DIAGNOSIS — N529 Male erectile dysfunction, unspecified: Secondary | ICD-10-CM

## 2014-09-10 DIAGNOSIS — E785 Hyperlipidemia, unspecified: Secondary | ICD-10-CM

## 2014-09-10 DIAGNOSIS — R6882 Decreased libido: Secondary | ICD-10-CM

## 2014-09-10 DIAGNOSIS — Z6379 Other stressful life events affecting family and household: Secondary | ICD-10-CM

## 2014-09-10 DIAGNOSIS — Z0000 Encounter for general adult medical examination without abnormal findings: Secondary | ICD-10-CM

## 2014-09-10 DIAGNOSIS — I341 Nonrheumatic mitral (valve) prolapse: Secondary | ICD-10-CM

## 2014-09-10 DIAGNOSIS — J309 Allergic rhinitis, unspecified: Secondary | ICD-10-CM

## 2014-09-10 LAB — CBC WITH DIFFERENTIAL/PLATELET
BASOS PCT: 1 % (ref 0–1)
Basophils Absolute: 0.1 10*3/uL (ref 0.0–0.1)
EOS ABS: 0.2 10*3/uL (ref 0.0–0.7)
EOS PCT: 3 % (ref 0–5)
HCT: 42.6 % (ref 39.0–52.0)
Hemoglobin: 15.1 g/dL (ref 13.0–17.0)
Lymphocytes Relative: 35 % (ref 12–46)
Lymphs Abs: 2.3 10*3/uL (ref 0.7–4.0)
MCH: 33 pg (ref 26.0–34.0)
MCHC: 35.4 g/dL (ref 30.0–36.0)
MCV: 93 fL (ref 78.0–100.0)
MONOS PCT: 10 % (ref 3–12)
MPV: 10.7 fL (ref 9.4–12.4)
Monocytes Absolute: 0.7 10*3/uL (ref 0.1–1.0)
Neutro Abs: 3.4 10*3/uL (ref 1.7–7.7)
Neutrophils Relative %: 51 % (ref 43–77)
Platelets: 168 10*3/uL (ref 150–400)
RBC: 4.58 MIL/uL (ref 4.22–5.81)
RDW: 12.9 % (ref 11.5–15.5)
WBC: 6.6 10*3/uL (ref 4.0–10.5)

## 2014-09-10 LAB — COMPREHENSIVE METABOLIC PANEL
ALT: 23 U/L (ref 0–53)
AST: 22 U/L (ref 0–37)
Albumin: 4.5 g/dL (ref 3.5–5.2)
Alkaline Phosphatase: 77 U/L (ref 39–117)
BILIRUBIN TOTAL: 0.9 mg/dL (ref 0.2–1.2)
BUN: 16 mg/dL (ref 6–23)
CO2: 28 mEq/L (ref 19–32)
CREATININE: 0.8 mg/dL (ref 0.50–1.35)
Calcium: 9.4 mg/dL (ref 8.4–10.5)
Chloride: 103 mEq/L (ref 96–112)
GLUCOSE: 78 mg/dL (ref 70–99)
Potassium: 3.8 mEq/L (ref 3.5–5.3)
Sodium: 137 mEq/L (ref 135–145)
TOTAL PROTEIN: 7.1 g/dL (ref 6.0–8.3)

## 2014-09-10 LAB — POCT URINALYSIS DIPSTICK
Bilirubin, UA: NEGATIVE
Glucose, UA: NEGATIVE
KETONES UA: NEGATIVE
Leukocytes, UA: NEGATIVE
Nitrite, UA: NEGATIVE
PH UA: 6
PROTEIN UA: NEGATIVE
RBC UA: NEGATIVE
Spec Grav, UA: >=1.03
Urobilinogen, UA: NEGATIVE

## 2014-09-10 LAB — LIPID PANEL
CHOLESTEROL: 151 mg/dL (ref 0–200)
HDL: 39 mg/dL — AB (ref >39)
LDL Cholesterol: 91 mg/dL (ref 0–99)
TRIGLYCERIDES: 105 mg/dL (ref <150)
Total CHOL/HDL Ratio: 3.9 Ratio
VLDL: 21 mg/dL (ref 0–40)

## 2014-09-10 NOTE — Progress Notes (Signed)
   Subjective:    Patient ID: Andrew Ayala, male    DOB: [birth year], 68 y.o.   MRN: [id number]  HPI He is here for complete examination. He does have concerns over the possibility of cardiac issues. His sister recently had a CVA which sounds secondary to atrial fib and not being on Coumadin. She apparently stopped this but he is not sure why. He has a history of MVP but is having no chest pain, shortness of breath, DOE. Does have underlying allergies and these are adequately controlled. Continues on a statin. He has been under a lot of stress due to work related issues as well as family issues. He notes low libido and has concerns over his testosterone. He does have an underlying history of ED.   Review of Systems  All other systems reviewed and are negative.      Objective:   Physical Exam BP 118/78 mmHg  Pulse 60  Wt 200 lb (90.719 kg)  General Appearance:    Alert, cooperative, no distress, appears stated age  Head:    Normocephalic, without obvious abnormality, atraumatic  Eyes:    PERRL, conjunctiva/corneas clear, EOM's intact, fundi    benign  Ears:    Normal TM's and external ear canals  Nose:   Nares normal, mucosa normal, no drainage or sinus   tenderness  Throat:   Lips, mucosa, and tongue normal; teeth and gums normal  Neck:   Supple, no lymphadenopathy;  thyroid:  no   enlargement/tenderness/nodules; no carotid   bruit or JVD  Back:    Spine nontender, no curvature, ROM normal, no CVA     tenderness  Lungs:     Clear to auscultation bilaterally without wheezes, rales or     ronchi; respirations unlabored  Chest Wall:    No tenderness or deformity   Heart:    Regular rate and rhythm, S1 and S2 normal, no murmur, rub   or gallop  Breast Exam:    No chest wall tenderness, masses or gynecomastia  Abdomen:     Soft, non-tender, nondistended, normoactive bowel sounds,    no masses, no hepatosplenomegaly        Extremities:   No clubbing, cyanosis or edema  Pulses:   2+  and symmetric all extremities  Skin:   Skin color, texture, turgor normal, no rashes or lesions  Lymph nodes:   Cervical, supraclavicular, and axillary nodes normal  Neurologic:   CNII-XII intact, normal strength, sensation and gait; reflexes 2+ and symmetric throughout          Psych:   Normal mood, affect, hygiene and grooming.          Assessment & Plan:  Routine physical examination - Plan: Urinalysis Dipstick  Allergic rhinitis, mild  Hyperlipidemia LDL goal <100  Erectile dysfunction, unspecified erectile dysfunction type  MVP (mitral valve prolapse) - Plan: 2D Echocardiogram without contrast  Routine general medical examination at a health care facility - Plan: CBC with Differential, Comprehensive metabolic panel, Lipid panel  Low libido - Plan: Testosterone  Work-related stress  Stress due to illness of family member  I discussed the work and home stress with him. He seems to have a good handle on this. He does plan on retiring within the next several years. Discussed the fact that he needs keep his mind and body busy when he retires. Continue on present medications and I will reassess when I get the blood work back.

## 2014-09-11 ENCOUNTER — Telehealth: Payer: Self-pay | Admitting: Internal Medicine

## 2014-09-11 LAB — TESTOSTERONE: Testosterone: 711 ng/dL (ref 300–890)

## 2014-09-11 NOTE — Progress Notes (Signed)
   Subjective:    Patient ID: Andrew Ayala, male    DOB: 08/24/1946, 68 y.o.   MRN: [id number]  HPI    Review of Systems     Objective:   Physical Exam Cardiac exam did show a 3/6 systolic murmur       Assessment & Plan:

## 2014-09-11 NOTE — Telephone Encounter (Signed)
Pt is scheduled for 2D Echocardiogram(no prior auth needed per Demetrius through pt insurance) on 09/12/14 @ 4:00pm. Pt is to go to [address] [place] [address]. If pt needs to reschedule he can call himself @ [phone]. Tried to call pt but pt is on another call and will have to call back

## 2014-09-12 ENCOUNTER — Ambulatory Visit (HOSPITAL_COMMUNITY): Payer: BC Managed Care – HMO | Attending: Family Medicine | Admitting: Radiology

## 2014-09-12 DIAGNOSIS — I341 Nonrheumatic mitral (valve) prolapse: Secondary | ICD-10-CM

## 2014-09-12 DIAGNOSIS — E785 Hyperlipidemia, unspecified: Secondary | ICD-10-CM | POA: Insufficient documentation

## 2014-09-12 NOTE — Progress Notes (Signed)
Echocardiogram performed.  

## 2014-09-19 ENCOUNTER — Other Ambulatory Visit: Payer: Self-pay | Admitting: Family Medicine

## 2014-09-27 ENCOUNTER — Telehealth: Payer: Self-pay | Admitting: Cardiovascular Disease

## 2014-09-27 NOTE — Telephone Encounter (Signed)
New Prob   Dr. Kerry Hough is requesting pt have a TEE performed by Dr. Johnsie Cancel. Requesting call back to discuss in detail. Please call.

## 2014-09-27 NOTE — Telephone Encounter (Addendum)
ATTEMPTED TO CALL  LISTED NUMBER   BUT BOTH  TIMES  I  REACH  ANOTHER  BUSINESS   AND  THE  NUMBER THAT IS  DIALED  IS  WHAT IS  IN  MESSAGE  BUT ON  RECEIVING  END  THEY SAY   THE  LAST  2 DIGITS ARE  DIFFERENT  WLL AWAIT FOR  RETURN CALL FROM    OFFICE  .Adonis Housekeeper

## 2014-09-28 NOTE — Addendum Note (Signed)
Addended by: Minette Headland A on: 09/28/2014 11:15 AM   Modules accepted: Orders

## 2014-10-02 NOTE — Telephone Encounter (Signed)
LEFT MESSAGE FOR  PT  TO CALL BACK  RE  CHANGING  TEE TIME TO  9:00 AM  .Adonis Housekeeper

## 2014-10-02 NOTE — Telephone Encounter (Signed)
PER PT  SOMEONE FROM [facility] HAD  CALLED AND  ASKED PT  TO  ARRIVE  AT  8:10 AM .Andrew Ayala

## 2014-10-02 NOTE — Telephone Encounter (Signed)
LMTCB .IF  CANNOT  HAVE TEE  DONE  AT  9:00 AM  TOMORROW ./CY

## 2014-10-02 NOTE — Telephone Encounter (Signed)
Follow up  Pt returning Andrew Ayala's phone call.

## 2014-10-03 ENCOUNTER — Encounter (HOSPITAL_COMMUNITY)
Admission: RE | Disposition: A | Discharge status: Home / Self Care | Payer: Self-pay | Source: Ambulatory Visit | Attending: Cardiovascular Disease

## 2014-10-03 ENCOUNTER — Encounter (HOSPITAL_COMMUNITY): Payer: Self-pay | Admitting: *Deleted

## 2014-10-03 ENCOUNTER — Ambulatory Visit (HOSPITAL_COMMUNITY)
Admission: RE | Admit: 2014-10-03 | Discharge: 2014-10-03 | Disposition: A | Discharge status: Home / Self Care | Payer: BLUE CROSS/BLUE SHIELD | Source: Ambulatory Visit | Attending: Cardiovascular Disease | Admitting: Cardiovascular Disease

## 2014-10-03 DIAGNOSIS — I071 Rheumatic tricuspid insufficiency: Secondary | ICD-10-CM | POA: Insufficient documentation

## 2014-10-03 DIAGNOSIS — I340 Nonrheumatic mitral (valve) insufficiency: Secondary | ICD-10-CM | POA: Diagnosis present

## 2014-10-03 DIAGNOSIS — E785 Hyperlipidemia, unspecified: Secondary | ICD-10-CM

## 2014-10-03 DIAGNOSIS — I361 Nonrheumatic tricuspid (valve) insufficiency: Secondary | ICD-10-CM

## 2014-10-03 HISTORY — PX: TEE WITHOUT CARDIOVERSION: SHX5443

## 2014-10-03 SURGERY — ECHOCARDIOGRAM, TRANSESOPHAGEAL
Anesthesia: Moderate Sedation

## 2014-10-03 MED ORDER — FENTANYL CITRATE 0.05 MG/ML IJ SOLN
INTRAMUSCULAR | Status: DC | PRN
  Administered 2014-10-03: 25 ug via INTRAVENOUS

## 2014-10-03 MED ORDER — SODIUM CHLORIDE 0.9 % IV SOLN
INTRAVENOUS | Status: DC
  Administered 2014-10-03: 500 mL via INTRAVENOUS

## 2014-10-03 MED ORDER — FENTANYL CITRATE 0.05 MG/ML IJ SOLN
INTRAMUSCULAR | Status: AC
  Filled 2014-10-03: qty 2

## 2014-10-03 MED ORDER — DIPHENHYDRAMINE HCL 50 MG/ML IJ SOLN
INTRAMUSCULAR | Status: AC
  Filled 2014-10-03: qty 1

## 2014-10-03 MED ORDER — MIDAZOLAM HCL 5 MG/ML IJ SOLN
INTRAMUSCULAR | Status: AC
  Filled 2014-10-03: qty 2

## 2014-10-03 MED ORDER — BUTAMBEN-TETRACAINE-BENZOCAINE 2-2-14 % EX AERO
INHALATION_SPRAY | CUTANEOUS | Status: DC | PRN
  Administered 2014-10-03: 2 via TOPICAL

## 2014-10-03 MED ORDER — MIDAZOLAM HCL 10 MG/2ML IJ SOLN
INTRAMUSCULAR | Status: DC | PRN
  Administered 2014-10-03 (×3): 2 mg via INTRAVENOUS

## 2014-10-03 NOTE — H&P (Signed)
Physician History and Physical     Patient ID: Andrew Ayala MRN: [id number] DOB/AGE: 1946/09/01 69 y.o. Admit date: (Not on file)  Primary Care Physician: Wyatt Haste, MD Primary Cardiologist:  None  Active Problems:   * No active hospital problems. *   HPI:   69 yo patient of Dr Voncille Lo.  History of MVP.  Echo done 09/13/15 with severe MR ? Flail leaflet   Study Conclusions  - Left ventricle: The cavity size was normal. Wall thickness was normal. Systolic function was normal. The estimated ejection fraction was in the range of 55% to 60%. - Mitral valve: Bileaflet prolapse worse in the posterior leaflet with more anteriorly directed MR. Degree of MR varies from parasternal to apical views but overall does appear severe Consider TEE to further evaluate since valve would be repairable - Left atrium: The atrium was mildly dilated. - Atrial septum: No defect or patent foramen ovale was identified.  Referred for TEE to further establish severity of MR and reparability  Some family stress but denies dyspnea, chest pain or palpitations .    Review of systems complete and found to be negative unless listed above   Past Medical History  Diagnosis Date  . Sensory - neural hearing loss   . Vasomotor rhinitis   . Allergic rhinitis   . MVP (mitral valve prolapse)   . BPH (benign prostatic hyperplasia)   . Hyperlipidemia     Family History  Problem Relation Age of Onset  . Colon cancer Paternal Uncle 72  . Stomach cancer Neg Hx   . Colon cancer Paternal Uncle 38  . Stroke Sister     History   Social History  . Marital Status: Married    Spouse Name: N/A    Number of Children: N/A  . Years of Education: N/A   Occupational History  . Not on file.   Social History Main Topics  . Smoking status: Never Smoker   . Smokeless tobacco: Never Used  . Alcohol Use: No  . Drug Use: No  . Sexual Activity: Yes   Other Topics Concern  . Not on file    Social History Narrative    Past Surgical History  Procedure Laterality Date  . Colonoscopy  2007    Dr. Henrene Pastor  . Hand surgery  2001    right  . Wart removal  1997    cryo surgery (penis)  . Wisdom tooth extraction  1994  . Septoplasty  1998  . Undescended testicle  1951     No prescriptions prior to admission    Physical Exam: There were no vitals taken for this visit. Affect appropriate Healthy:  appears stated age 77: normal Neck supple with no adenopathy JVP normal no bruits no thyromegaly Lungs clear with no wheezing and good diaphragmatic motion Heart:  S1/S2 loud MR  murmur, no rub, gallop or click PMI normal Abdomen: benighn, BS positve, no tenderness, no AAA no bruit.  No HSM or HJR Distal pulses intact with no bruits No edema Neuro non-focal Skin warm and dry No muscular weakness  No current facility-administered medications on file prior to encounter.   Current Outpatient Prescriptions on File Prior to Encounter  Medication Sig Dispense Refill  . Multiple Vitamins-Minerals (MULTIVITAMIN WITH MINERALS) tablet Take 1 tablet by mouth daily.    Marland Kitchen scopolamine (TRANSDERM-SCOP) 1.5 MG Place 1 patch (1.5 mg total) onto the skin every 3 (three) days. (Patient taking differently: Place 1 patch onto the skin every 3 (  three) days as needed (for boat trips). ) 1 patch 0  . vardenafil (LEVITRA) 10 MG tablet Take 1 tablet (10 mg total) by mouth daily as needed for erectile dysfunction. 10 tablet 11  . atorvastatin (LIPITOR) 10 MG tablet TAKE 1 TABLET BY MOUTH ONCE DAILY (Patient not taking: Reported on 10/02/2014) 30 tablet 5  . finasteride (PROSCAR) 5 MG tablet TAKE 1 TABLET BY MOUTH ONCE DAILY (Patient not taking: Reported on 10/02/2014) 30 tablet 5    Labs:   Lab Results  Component Value Date   WBC 6.6 09/10/2014   HGB 15.1 09/10/2014   HCT 42.6 09/10/2014   MCV 93.0 09/10/2014   PLT 168 09/10/2014   No results for input(s): NA, K, CL, CO2, BUN, CREATININE,  CALCIUM, PROT, BILITOT, ALKPHOS, ALT, AST, GLUCOSE in the last 168 hours.  Invalid input(s): LABALBU No results found for: CKTOTAL, CKMB, CKMBINDEX, TROPONINI   Lab Results  Component Value Date   CHOL 151 09/10/2014   CHOL 169 08/25/2013   CHOL 156 02/22/2012   Lab Results  Component Value Date   HDL 39* 09/10/2014   HDL 37* 08/25/2013   HDL 37* 02/22/2012   Lab Results  Component Value Date   LDLCALC 91 09/10/2014   LDLCALC 106* 08/25/2013   LDLCALC 83 02/22/2012   Lab Results  Component Value Date   TRIG 105 09/10/2014   TRIG 129 08/25/2013   TRIG 179* 02/22/2012   Lab Results  Component Value Date   CHOLHDL 3.9 09/10/2014   CHOLHDL 4.6 08/25/2013   CHOLHDL 4.2 02/22/2012   No results found for: LDLDIRECT     Radiology: No results found.    ASSESSMENT AND PLAN:  MVP with severe MR  TEE planned to define anatomy  Signed: Collier Salina Nishan1/13/2016, 8:05 AM

## 2014-10-03 NOTE — Progress Notes (Signed)
  Echocardiogram 2D Echocardiogram has been performed.  Andrew Ayala 10/03/2014, 2:54 PM

## 2014-10-03 NOTE — Discharge Instructions (Signed)
Transesophageal Echocardiogram Transesophageal echocardiography (TEE) is a picture test of your heart using sound waves. The pictures taken can give very detailed pictures of your heart. This can help your doctor see if there are problems with your heart. TEE can check:  If your heart has blood clots in it.  How well your heart valves are working.  If you have an infection on the inside of your heart.  Some of the major arteries of your heart.  If your heart valve is working after a Office manager.  Your heart before a procedure that uses a shock to your heart to get the rhythm back to normal. BEFORE THE PROCEDURE  Do not eat or drink for 6 hours before the procedure or as told by your doctor.  Make plans to have someone drive you home after the procedure. Do not drive yourself home.  An IV tube will be put in your arm. PROCEDURE  You will be given a medicine to help you relax (sedative). It will be given through the IV tube.  A numbing medicine will be sprayed or gargled in the back of your throat to help numb it.  The tip of the probe is placed into the back of your mouth. You will be asked to swallow. This helps to pass the probe into your esophagus.  Once the tip of the probe is in the right place, your doctor can take pictures of your heart.  You may feel pressure at the back of your throat. AFTER THE PROCEDURE  You will be taken to a recovery area so the sedative can wear off.  Your throat may be sore and scratchy. This will go away slowly over time.  You will go home when you are fully awake and able to swallow liquids.  You should have someone stay with you for the next 24 hours.  Do not drive or operate machinery for the next 24 hours. Document Released: 07/05/2009 Document Revised: 09/12/2013 Document Reviewed: 03/09/2013 [facility] Patient Information 2015 [place], [place]. This information is not intended to replace advice given to you by your health care provider. Make  sure you discuss any questions you have with your health care provider.  Has f/u appt with Dr Johnsie Cancel 10/05/14

## 2014-10-03 NOTE — CV Procedure (Signed)
TEE:  5 mg versed 25 ug fentanyl  Normal LV EF 60% Severe prolapse of middle scallop posterior leaflet with severe MR Normal AV Mild TR Redundant atrial septum Mild mural aortic debris Normal RV No LAA thrombus  Jenkins Rouge

## 2014-10-03 NOTE — Interval H&P Note (Signed)
History and Physical Interval Note:  10/03/2014 8:08 AM  Andrew Ayala  has presented today for surgery, with the diagnosis of MURMUR  The various methods of treatment have been discussed with the patient and family. After consideration of risks, benefits and other options for treatment, the patient has consented to  Procedure(s): TRANSESOPHAGEAL ECHOCARDIOGRAM (TEE) (N/A) as a surgical intervention .  The patient's history has been reviewed, patient examined, no change in status, stable for surgery.  I have reviewed the patient's chart and labs.  Questions were answered to the patient's satisfaction.     Jenkins Rouge

## 2014-10-04 ENCOUNTER — Encounter (HOSPITAL_COMMUNITY): Payer: Self-pay | Admitting: Cardiovascular Disease

## 2014-10-05 ENCOUNTER — Ambulatory Visit (INDEPENDENT_AMBULATORY_CARE_PROVIDER_SITE_OTHER): Payer: BLUE CROSS/BLUE SHIELD | Admitting: Cardiovascular Disease

## 2014-10-05 ENCOUNTER — Encounter: Payer: Self-pay | Admitting: Cardiovascular Disease

## 2014-10-05 VITALS — BP 112/70 | HR 57 | Ht 75.0 in | Wt 202.6 lb

## 2014-10-05 DIAGNOSIS — I340 Nonrheumatic mitral (valve) insufficiency: Secondary | ICD-10-CM

## 2014-10-05 DIAGNOSIS — E785 Hyperlipidemia, unspecified: Secondary | ICD-10-CM

## 2014-10-05 HISTORY — DX: Nonrheumatic mitral (valve) insufficiency: I34.0

## 2014-10-05 NOTE — Progress Notes (Signed)
Patient ID: Andrew Ayala, male   DOB: July 14, 1946, 69 y.o.   MRN: [id number]    Cardiology Office Note   Date:  10/05/2014   ID:  Andrew Ayala, DOB [birth year], MRN [id number]  PCP:  Wyatt Haste, MD  Cardiologist:   Jenkins Rouge, MD   No chief complaint on file.     History of Present Illness: Andrew Ayala is a 69 y.o. male who presents for evaluation of MR.  Long standing history of MVP  Seen by [facility] about 12 years ago and told he was fine.  Despite murmur no f/u echo done since then.  Still working in [employer] but is otherwise sedentary.  No chest pain dyspnea palpitations or syncope CRF;s elevated lipids on statin No hisotry of rheumatic fever, connective tissue disease or CAD.  Had TTE done Echo done 09/12/14 reviewed   Study Conclusions  - Left ventricle: The cavity size was normal. Wall thickness was normal. Systolic function was normal. The estimated ejection fraction was in the range of 55% to 60%. - Mitral valve: Bileaflet prolapse worse in the posterior leaflet with more anteriorly directed MR. Degree of MR varies from parasternal to apical views but overall does appear severe Consider TEE to further evaluate since valve would be repairable - Left atrium: The atrium was mildly dilated. - Atrial septum: No defect or patent foramen ovale was identified.  F/U TEE done by me on 1/13 showed no flail but severe prolapse of middle scallop of P2 with PISA 1.3 and RV of 51cc No CHF, chest pain  Occasional palpitations and PAC;s noted during TEE No history of TIA/CVA  Reviewed TEE/TTE with patient.  Discussed issues of post repair decrease in LV , function, risk of afib and CHF.  Also discussed need for right and  Left heart cath prior to surgery.      Past Medical History  Diagnosis Date  . Sensory - neural hearing loss   . Vasomotor rhinitis   . Allergic rhinitis   . MVP (mitral valve prolapse)   . BPH (benign prostatic hyperplasia)   .  Hyperlipidemia     Past Surgical History  Procedure Laterality Date  . Colonoscopy  2007    Dr. Henrene Pastor  . Hand surgery  2001    right  . Wart removal  1997    cryo surgery (penis)  . Wisdom tooth extraction  1994  . Septoplasty  1998  . Undescended testicle  1951  . Tee without cardioversion N/A 10/03/2014    Procedure: TRANSESOPHAGEAL ECHOCARDIOGRAM (TEE);  Surgeon: Josue Hector, MD;  Location: [facility] ENDOSCOPY;  Service: Cardiovascular;  Laterality: N/A;     Current Outpatient Prescriptions  Medication Sig Dispense Refill  . amoxicillin (AMOXIL) 500 MG capsule Take 2,000 mg by mouth See admin instructions. Take 4 capsules (2000 mg) one hour prior to appointment    . aspirin EC 81 MG tablet Take 81 mg by mouth daily before supper.    Marland Kitchen atorvastatin (LIPITOR) 10 MG tablet Take 10 mg by mouth daily before supper.    . finasteride (PROSCAR) 5 MG tablet Take 5 mg by mouth daily before supper.    Marland Kitchen GLUCOSAMINE-CHONDROITIN PO Take 1 tablet by mouth daily. Glucosamine 1500 mg/ chrondroiton 1200 mg    . Multiple Vitamins-Minerals (MULTIVITAMIN WITH MINERALS) tablet Take 1 tablet by mouth daily.    . naproxen sodium (ALEVE) 220 MG tablet Take 220-440 mg by mouth 2 (two) times daily as needed (pain).    Marland Kitchen  scopolamine (TRANSDERM-SCOP) 1.5 MG Place 1 patch (1.5 mg total) onto the skin every 3 (three) days. (Patient taking differently: Place 1 patch onto the skin every 3 (three) days as needed (for boat trips). ) 1 patch 0  . vardenafil (LEVITRA) 10 MG tablet Take 1 tablet (10 mg total) by mouth daily as needed for erectile dysfunction. 10 tablet 11   No current facility-administered medications for this visit.    Allergies:   Review of patient's allergies indicates no known allergies.    Social History:  The patient  reports that he has never smoked. He has never used smokeless tobacco. He reports that he does not drink alcohol or use illicit drugs.   Family History:  The patient's family  history includes Colon cancer (age of onset: 25) in his paternal uncle; Colon cancer (age of onset: 56) in his paternal uncle; Stroke in his sister. There is no history of Stomach cancer.    ROS:  Please see the history of present illness.   Otherwise, review of systems are positive for none.   All other systems are reviewed and negative.    PHYSICAL EXAM: VS:  There were no vitals taken for this visit. , BMI There is no weight on file to calculate BMI. GEN: Well nourished, well developed, in no acute distress HEENT: normal Neck: no JVD, carotid bruits, or masses Cardiac: RRR; Loud  Mid systolic MR  murmurs, rubs, or gallops,no edema  Respiratory:  clear to auscultation bilaterally, normal work of breathing GI: soft, nontender, nondistended, + BS MS: no deformity or atrophy Skin: warm and dry, no rash Neuro:  Strength and sensation are intact Psych: euthymic mood, full affect   EKG:  EKG  SR rate 56 normal     Recent Labs: 09/10/2014: ALT 23; BUN 16; Creatinine 0.80; Hemoglobin 15.1; Platelets 168; Potassium 3.8; Sodium 137    Lipid Panel    Component Value Date/Time   CHOL 151 09/10/2014 1535   TRIG 105 09/10/2014 1535   HDL 39* 09/10/2014 1535   CHOLHDL 3.9 09/10/2014 1535   VLDL 21 09/10/2014 1535   LDLCALC 91 09/10/2014 1535      Wt Readings from Last 3 Encounters:  10/03/14 90.719 kg (200 lb)  09/10/14 90.719 kg (200 lb)  08/25/13 89.359 kg (197 lb)      Other studies Reviewed: Additional studies/ records that were reviewed today include: TEE/TTE. Review of the above records demonstrates: severe MR      Current medicines are reviewed at length with the patient today.  The patient has concerns regarding medicines.  The following changes have been made:  no change  Labs/ tests ordered today include:  No orders of the defined types were placed in this encounter.     Disposition:   FU with Dr Roxy Manns in  in 2 weeks and Dr Johnsie Cancel in 3 months     Signed, Jenkins Rouge, MD  10/05/2014 11:30 AM    [address], [place], [place]  [id number] Phone: [phone]; Fax: [phone]

## 2014-10-05 NOTE — Patient Instructions (Addendum)
Your physician recommends that you schedule a follow-up appointment in:   [place]   Your physician recommends that you continue on your current medications as directed. Please refer to the Current Medication list given to you today.     You have been referred to DR  OWEN  IN 2-3  WEEKS   DX  MVP

## 2014-10-05 NOTE — Assessment & Plan Note (Signed)
Cholesterol is at goal.  Continue current dose of statin and diet Rx.  No myalgias or side effects.  F/U  LFT's in 6 months. Lab Results  Component Value Date   LDLCALC 91 09/10/2014

## 2014-10-05 NOTE — Assessment & Plan Note (Addendum)
Severe presumed asymptomatic MR.  Relatively elderly male with long period of over 12 years no echo evaluation so unknown period of severe volume overload on heart.  Long discussion with patient and wife regarding literature on operating on severe asymptomatic MR particularly in setting of flail or severe prolapse where surgical mortality less than 1% and estimation of repair over 95%.  Malachy Moan, [place] and [place])  Also class 2A recommendation from recent AHA/ACC guidelines on valvular heart disease.  Reviewed TEE with Dr Roxy Manns CVTS who agrees and will see patient as outpatient.  Would plan minimally invasive right throracotomy.  In patients current setting 5 yr mortality can be as high as 22% with observation and cardiac event rate 33% if you include CHF and PAF as end points.  His sister just had CVA and is in a wheel chair from stroke so he is aware of the risks and complications of afib which is is at high risk of developing in setting of severe MR and ambient PAC;s.  He would like to wait until June/July.  Will do right and left heart cath in May  Will see Dr Roxy Manns in next 2-3 weeks  Has just seen dentist last month and encouraged to see again one month before any surgery

## 2014-10-29 ENCOUNTER — Encounter: Payer: Self-pay | Admitting: Thoracic Surgery (Cardiothoracic Vascular Surgery)

## 2014-10-29 ENCOUNTER — Institutional Professional Consult (permissible substitution) (INDEPENDENT_AMBULATORY_CARE_PROVIDER_SITE_OTHER): Payer: BLUE CROSS/BLUE SHIELD | Admitting: Thoracic Surgery (Cardiothoracic Vascular Surgery)

## 2014-10-29 VITALS — BP 103/67 | HR 61 | Resp 20 | Ht 75.0 in | Wt 202.0 lb

## 2014-10-29 DIAGNOSIS — I340 Nonrheumatic mitral (valve) insufficiency: Secondary | ICD-10-CM

## 2014-10-29 NOTE — Progress Notes (Signed)
[address]       [place] [address]             [phone]     CARDIOTHORACIC SURGERY CONSULTATION REPORT  Referring Provider is Josue Hector, MD PCP is Wyatt Haste, MD  Chief Complaint  Patient presents with  . Mitral Regurgitation    Surgical eval, TEE 10/03/14, 2D Echo 09/12/14    HPI:  Patient is a 69 year old male with long-standing history of mitral valve prolapse and heart murmur who has recently been found to have severe mitral regurgitation and has been referred for surgical consultation to consider possible elective mitral valve repair. The patient states that he has been told that he had mitral valve prolapse and a heart murmur for much of his adult life. He apparently was evaluated more than 12 years ago at the [facility] heart and vascular center. He was released from their care without follow-up. On recent follow-up examination with the patient's primary care physician the patient was noted to have a prominent systolic murmur on physical exam. The patient also expressed concerns regarding his underlying family history with known strong family history of coronary artery disease and a sister who recently suffered a stroke related to atrial fibrillation. He subsequently underwent transthoracic echocardiogram 09/12/2014 that revealed bileaflet prolapse of the mitral valve with possibly severe mitral regurgitation. The patient was referred for formal cardiology consultation with Dr. Johnsie Cancel, and he subsequently underwent transesophageal echocardiogram 10/03/2014 that confirmed the presence of mitral valve prolapse with severe mitral regurgitation.  There was normal left ventricular size and systolic function with ejection fraction estimated 60-65%. The patient has been referred for elective surgical consultation.  The patient is married and lives locally with his wife in [place]. 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 This requires some travel in the region but no strenuous physical activity. The patient lives a somewhat sedentary lifestyle. He denies any associated symptoms of exertional shortness of breath or fatigue. He has never had any chest pain or chest tightness with activity. He sometimes feels as though his heart is pounding, particularly when he is lying in bed sleeping at night. He denies any history of PND, orthopnea, or lower extremity edema. He has occasional palpitations. He denies any history of dizzy spells or syncope.  Past Medical History  Diagnosis Date  . Sensory - neural hearing loss   . Vasomotor rhinitis   . Allergic rhinitis   . MVP (mitral valve prolapse)   . BPH (benign prostatic hyperplasia)   . Hyperlipidemia   . Severe mitral regurgitation 10/05/2014    Past Surgical History  Procedure Laterality Date  . Colonoscopy  2007    Dr. Henrene Pastor  . Hand surgery  2001    right  . Wart removal  1997    cryo surgery (penis)  . Wisdom tooth extraction  1994  . Septoplasty  1998  . Undescended testicle  1951  . Tee without cardioversion N/A 10/03/2014    Procedure: TRANSESOPHAGEAL ECHOCARDIOGRAM (TEE);  Surgeon: Josue Hector, MD;  Location: [facility] ENDOSCOPY;  Service: Cardiovascular;  Laterality: N/A;    Family History  Problem Relation Age of Onset  . Colon cancer Paternal Uncle 16  . Stomach cancer Neg Hx   . Colon cancer Paternal Uncle 2  . Stroke Sister   . Heart failure Mother   . Heart attack Father     History   Social History  .  Marital Status: Married    Spouse Name: N/A    Number of Children: N/A  . Years of Education: N/A   Occupational History  . Not on file.   Social History Main Topics  . Smoking status: Never Smoker   . Smokeless tobacco: Never Used  . Alcohol Use: No  . Drug Use: No  . Sexual Activity: Yes   Other Topics Concern  . Not on file   Social History Narrative    Current Outpatient Prescriptions  Medication Sig  Dispense Refill  . amoxicillin (AMOXIL) 500 MG capsule Take 2,000 mg by mouth See admin instructions. Take 4 capsules (2000 mg) one hour prior to appointment    . aspirin EC 81 MG tablet Take 81 mg by mouth daily before supper.    Marland Kitchen atorvastatin (LIPITOR) 10 MG tablet Take 10 mg by mouth daily before supper.    . finasteride (PROSCAR) 5 MG tablet Take 5 mg by mouth daily before supper.    Marland Kitchen GLUCOSAMINE-CHONDROITIN PO Take 1 tablet by mouth daily. Glucosamine 1500 mg/ chrondroiton 1200 mg    . Multiple Vitamins-Minerals (MULTIVITAMIN WITH MINERALS) tablet Take 1 tablet by mouth daily.    . naproxen sodium (ALEVE) 220 MG tablet Take 220-440 mg by mouth 2 (two) times daily as needed (pain).    . Omega-3 Fatty Acids (FISH OIL) 1000 MG CAPS Take by mouth daily.    . vardenafil (LEVITRA) 10 MG tablet Take 1 tablet (10 mg total) by mouth daily as needed for erectile dysfunction. 10 tablet 11   No current facility-administered medications for this visit.    No Known Allergies    Review of Systems:   General:  normal appetite, normal energy, no weight gain, no weight loss, no fever  Cardiac:  no chest pain with exertion, no chest pain at rest, no SOB with exertion, no resting SOB, no PND, no orthopnea, + palpitations, no arrhythmia, no atrial fibrillation, no LE edema, no dizzy spells, no syncope  Respiratory:  no shortness of breath, no home oxygen, no productive cough, no dry cough, no bronchitis, no wheezing, no hemoptysis, no asthma, no pain with inspiration or cough, no sleep apnea, no CPAP at night  GI:   no difficulty swallowing, no reflux, occasional frequent heartburn, no hiatal hernia, no abdominal pain, no constipation, no diarrhea, no hematochezia, no hematemesis, no melena  GU:   no dysuria,  no frequency, no urinary tract infection, no hematuria, + enlarged prostate, + nocturia, no kidney stones, no kidney disease  Vascular:  no pain suggestive of claudication, no pain in feet,  occasional leg cramps, no varicose veins, no DVT, no non-healing foot ulcer  Neuro:   no stroke, no TIA's, no seizures, no headaches, no temporary blindness one eye,  no slurred speech, no peripheral neuropathy, no chronic pain, no instability of gait, no memory/cognitive dysfunction  Musculoskeletal: no arthritis, no joint swelling, no myalgias, no difficulty walking, normal mobility   Skin:   no rash, no itching, no skin infections, no pressure sores or ulcerations  Psych:   no anxiety, no depression, no nervousness, no unusual recent stress  Eyes:   no blurry vision, + floaters, no recent vision changes, + wears glasses or contacts  ENT:   no hearing loss, no loose or painful teeth, no dentures, last saw dentist January 2016 - gets antibiotic prophylaxis  Hematologic:  no easy bruising, no abnormal bleeding, no clotting disorder, no frequent epistaxis  Endocrine:  no diabetes, does not  check CBG's at home     Physical Exam:   BP 103/67 mmHg  Pulse 61  Resp 20  Ht 6\' 3"  (1.905 m)  Wt 202 lb (91.627 kg)  BMI 25.25 kg/m2  SpO2 98%  General:    well-appearing  HEENT:  Unremarkable   Neck:   no JVD, no bruits, no adenopathy   Chest:   clear to auscultation, symmetrical breath sounds, no wheezes, no rhonchi   CV:   RRR, grade III/VI systolic murmur   Abdomen:  soft, non-tender, no masses   Extremities:  warm, well-perfused, pulses palpable, no LE edema  Rectal/GU  Deferred  Neuro:   Grossly non-focal and symmetrical throughout  Skin:   Clean and dry, no rashes, no breakdown   Diagnostic Tests:  Transthoracic Echocardiography  Patient:  Andrew Ayala, Andrew Ayala MR #:    [id number] Study Date: 09/12/2014 Gender:   M Age:    53 Height:   190.5 cm Weight:   90.7 kg BSA:    2.2 m^2 Pt. Status: Room:  SONOGRAPHER Victorio Palm, RDCS ATTENDING  Herb Grays REFERRING  Denita Lung PERFORMING  [facility],  Outpatient  cc:  ------------------------------------------------------------------- LV EF: 55% -  60%  ------------------------------------------------------------------- Indications:   Mitral valve prolapse (I34.1).  ------------------------------------------------------------------- History:  PMH: Acquired from the patient and from the patient&'s chart. No prior cardiac history. Mitral valve prolapse. Risk factors: Dyslipidemia.  ------------------------------------------------------------------- Study Conclusions  - Left ventricle: The cavity size was normal. Wall thickness was normal. Systolic function was normal. The estimated ejection fraction was in the range of 55% to 60%. - Mitral valve: Bileaflet prolapse worse in the posterior leaflet with more anteriorly directed MR. Degree of MR varies from parasternal to apical views but overall does appear severe Consider TEE to further evaluate since valve would be repairable - Left atrium: The atrium was mildly dilated. - Atrial septum: No defect or patent foramen ovale was identified.  Transthoracic echocardiography. M-mode, complete 2D, spectral Doppler, and color Doppler. Birthdate: Patient birthdate: 05-16-1946. Age: Patient is 69 yr old. Sex: Gender: male. BMI: 25 kg/m^2. Blood pressure:   118/78 Patient status: Outpatient. Study date: Study date: 09/12/2014. Study time: 04:20 PM. Location: Catheterization laboratory.  -------------------------------------------------------------------  ------------------------------------------------------------------- Left ventricle: The cavity size was normal. Wall thickness was normal. Systolic function was normal. The estimated ejection fraction was in the range of 55% to 60%.  ------------------------------------------------------------------- Aortic valve:  Mildly thickened leaflets. Doppler:  There was  no stenosis.  ------------------------------------------------------------------- Aorta: The aorta was normal, not dilated, and non-diseased.  ------------------------------------------------------------------- Mitral valve: Bileaflet prolapse worse in the posterior leaflet with more anteriorly directed MR. Degree of MR varies from parasternal to apical views but overall does appear severe Consider TEE to further evaluate since valve would be repairable Doppler: Peak gradient (D): 3 mm Hg.  ------------------------------------------------------------------- Left atrium: The atrium was mildly dilated.  ------------------------------------------------------------------- Atrial septum: No defect or patent foramen ovale was identified.  ------------------------------------------------------------------- Right ventricle: The cavity size was normal. Wall thickness was normal. Systolic function was normal.  ------------------------------------------------------------------- Pulmonic valve:  Structurally normal valve.  Cusp separation was normal. Doppler: Transvalvular velocity was within the normal range. There was mild regurgitation.  ------------------------------------------------------------------- Tricuspid valve:  Structurally normal valve.  Leaflet separation was normal. Doppler: Transvalvular velocity was within the normal range. There was mild regurgitation.  ------------------------------------------------------------------- Right atrium: The atrium was normal in size.  ------------------------------------------------------------------- Pericardium: The pericardium was normal in appearance.  ------------------------------------------------------------------- Post procedure  conclusions Ascending Aorta:  - The aorta was normal, not dilated, and non-diseased.  ------------------------------------------------------------------- Measurements  Left  ventricle             Value    Reference LV ID, ED, PLAX chordal        45  mm   43 - 52 LV ID, ES, PLAX chordal        31  mm   23 - 38 LV fx shortening, PLAX chordal     31  %   >=29 LV PW thickness, ED          11  mm   --------- IVS/LV PW ratio, ED          0.82     <=1.3 Stroke volume, 2D           91  ml   --------- Stroke volume/bsa, 2D         41  ml/m^2 --------- LV e&', lateral             10.2 cm/s  --------- LV E/e&', lateral            8.27     --------- LV e&', medial             6.09 cm/s  --------- LV E/e&', medial            13.86    --------- LV e&', average             8.15 cm/s  --------- LV E/e&', average            10.36    ---------  Ventricular septum           Value    Reference IVS thickness, ED           9   mm   ---------  LVOT                  Value    Reference LVOT ID, S               24  mm   --------- LVOT area               4.52 cm^2  --------- LVOT ID                24  mm   --------- LVOT mean velocity, S         70.8 cm/s  --------- LVOT VTI, S              20.1 cm   --------- Stroke volume (SV), LVOT DP      90.9 ml   --------- Stroke index (SV/bsa), LVOT DP     41.4 ml/m^2 ---------  Aorta                 Value    Reference Aortic root ID, ED           38  mm   --------- Ascending aorta ID, A-P, S       36  mm   ---------  Left atrium              Value    Reference LA ID, A-P, ES             37  mm   --------- LA ID/bsa, A-P             1.69 cm/m^2 <=2.2 LA volume, S  41.8 ml   --------- LA volume/bsa, S            19  ml/m^2 --------- LA volume, ES, 1-p A4C         30.4 ml   --------- LA volume/bsa, ES, 1-p A4C       13.8 ml/m^2 --------- LA volume, ES, 1-p A2C         51.1 ml   --------- LA volume/bsa, ES, 1-p A2C       23.3 ml/m^2 ---------  Mitral valve              Value    Reference Mitral E-wave peak velocity      84.4 cm/s  --------- Mitral deceleration time        229  ms   150 - 230 Mitral peak gradient, D        3   mm Hg --------- Mitral E/A ratio, peak         1.1     ---------  Right ventricle            Value    Reference RV s&', lateral, S           12.3 cm/s  ---------  Legend: (L) and (H) mark values outside specified reference range.  ------------------------------------------------------------------- Prepared and Electronically Authenticated by  Jenkins Rouge, M.D. 2015-12-23T17:24:40    Transesophageal Echocardiography  Patient:  Andrew Ayala, Andrew Ayala MR #:    [id number] Study Date: 10/03/2014 Gender:   M Age:    73 Height:   190.5 cm Weight:   90.9 kg BSA:    2.2 m^2 Pt. Status: Room:  ADMITTING  Jenkins Rouge, M.D. ATTENDING  Jenkins Rouge, M.D. ORDERING   Jenkins Rouge, M.D. PERFORMING  Jenkins Rouge, M.D. REFERRING  Jenkins Rouge, M.D. SONOGRAPHER Donata Clay  cc:  ------------------------------------------------------------------- LV EF: 60% -  65%  ------------------------------------------------------------------- Indications:   Mitral valve prolapse 424.0.  ------------------------------------------------------------------- History:  Risk factors: Dyslipidemia.  ------------------------------------------------------------------- Study Conclusions  - Left ventricle: Systolic function was normal. The  estimated ejection fraction was in the range of 60% to 65%. - Aortic valve: No evidence of vegetation. - Mitral valve: Myxomatous valve Severe prolapse P2 segment with severe MR PISA 1.3 Regurgitant volume 53 cc - Left atrium: The atrium was dilated. No evidence of thrombus in the atrial cavity or appendage. - Right atrium: No evidence of thrombus in the atrial cavity or appendage. - Atrial septum: No defect or patent foramen ovale was identified. Echo contrast study showed no right-to-left atrial level shunt, following an increase in RA pressure induced by provocative maneuvers. - Tricuspid valve: A prosthesis was present and functioning normally. The prosthesis had a normal range of motion. The sewing ring appeared normal, had no rocking motion, and showed no evidence of dehiscence. - Pulmonic valve: No evidence of vegetation.  Diagnostic transesophageal echocardiography. 2D and color Doppler. Birthdate: Patient birthdate: [birth year]. Age: Patient is 69 yr old. Sex: Gender: male.  BMI: 25.1 kg/m^2. Blood pressure: 130/76 Patient status: Outpatient. Study date: Study date: 10/03/2014. Study time: 01:53 PM. Location: Endoscopy.  -------------------------------------------------------------------  ------------------------------------------------------------------- Left ventricle: Systolic function was normal. The estimated ejection fraction was in the range of 60% to 65%.  ------------------------------------------------------------------- Aortic valve:  Structurally normal valve.  Cusp separation was normal. No evidence of vegetation. Doppler: There was no regurgitation.  ------------------------------------------------------------------- Aorta: The aorta was normal, not dilated, and non-diseased.  ------------------------------------------------------------------- Mitral valve: Myxomatous valve Severe prolapse P2 segment  with severe MR PISA 1.3  Regurgitant volume 53 cc  ------------------------------------------------------------------- Left atrium: The atrium was dilated. No evidence of thrombus in the atrial cavity or appendage.  ------------------------------------------------------------------- Atrial septum: No defect or patent foramen ovale was identified. Echo contrast study showed no right-to-left atrial level shunt, following an increase in RA pressure induced by provocative maneuvers.  ------------------------------------------------------------------- Right ventricle: The cavity size was normal. Wall thickness was normal. Systolic function was normal.  ------------------------------------------------------------------- Pulmonic valve:  Structurally normal valve.  Cusp separation was normal. No evidence of vegetation.  ------------------------------------------------------------------- Tricuspid valve: A prosthesis was present and functioning normally. The prosthesis had a normal range of motion. The sewing ring appeared normal, had no rocking motion, and showed no evidence of dehiscence. Doppler: There was mild regurgitation.  ------------------------------------------------------------------- Right atrium: The atrium was normal in size. No evidence of thrombus in the atrial cavity or appendage.  ------------------------------------------------------------------- Pericardium: The pericardium was normal in appearance. There was no pericardial effusion.  ------------------------------------------------------------------- Post procedure conclusions Ascending Aorta:  - The aorta was normal, not dilated, and non-diseased.  ------------------------------------------------------------------- Measurements  Mitral valve               Value Mitral maximal regurg velocity, PISA   539  cm/s Mitral regurg VTI, PISA          88.9 cm Mitral  ERO, PISA             0.53 cm^2 Mitral regurg volume, PISA        47  ml  Legend: (L) and (H) mark values outside specified reference range.  ------------------------------------------------------------------- Prepared and Electronically Authenticated by  Jenkins Rouge, M.D. 2016-01-13T16:45:43        Impression:  I have personally reviewed the patient's recent transthoracic and transesophageal echocardiograms.  Patient has mitral valve prolapse with stage C severe asymptomatic primary mitral regurgitation. Left ventricular size and systolic function remains preserved.  The patient has typical myxomatous disease with what may be a small flail segment involving the posterior leaflet. Alternatives include continued medical therapy with close follow-up versus proceeding with elective mitral valve repair in the near future.  There are no other complicating features and I feel there is a greater than 95% likelihood his valve should be repairable with expectations for very low operative risk and excellent long-term result.  The patient may be a good candidate for minimally invasive approach for surgery, although he will need diagnostic cardiac catheterization to be performed prior to surgery.  Plan:  The patient and his wife were counseled at length regarding the indications, risks and potential benefits of mitral valve repair.  The rationale for elective surgery has been explained, including a comparison between surgery and continued medical therapy with close follow-up.  The likelihood of successful and durable valve repair has been discussed with particular reference to the findings of their recent echocardiogram.  Based upon these findings and previous experience, I have quoted them a greater than 95 percent likelihood of successful valve repair.   The patient is interested in proceeding with elective surgery in the near future, but he wants to discuss the timing of  surgery with his wife and family.  At this point he is favoring scheduling surgery sometime this summer. He will contact Dr. Johnsie Cancel to schedule diagnostic cardiac catheterization at some point in the spring and we will plan to see the patient back in follow-up once his catheterization has been performed. All of his questions been addressed.    I spent in excess of 90 minutes during the conduct  of this office consultation and >50% of this time involved direct face-to-face encounter with the patient for counseling and/or coordination of their care.   Valentina Gu. Roxy Manns, MD 10/29/2014 10:51 AM

## 2014-10-29 NOTE — Patient Instructions (Signed)
Schedule heart catheterization with Dr Johnsie Cancel

## 2014-11-05 ENCOUNTER — Telehealth: Payer: Self-pay | Admitting: Cardiovascular Disease

## 2014-11-05 NOTE — Telephone Encounter (Signed)
Patient informed that he would not be able to do any lifting over 5 lbs for up to 10 days, no driving for several days after procedure and depends on his job type to determine when he would return to work. Patient also had questions about billing. Forward call to billing department to help with questions.

## 2014-11-05 NOTE — Telephone Encounter (Signed)
New Msg          Pt would like to know the recovery time for a catherization.   Please return pt call.

## 2014-11-28 ENCOUNTER — Telehealth: Payer: Self-pay | Admitting: Cardiovascular Disease

## 2014-11-28 NOTE — Telephone Encounter (Signed)
New Message  Pt called to have his CATH scheduled for may. Around the 16th - 20th. Would prefer a Friday.  Please call back to schedule

## 2014-11-28 NOTE — Telephone Encounter (Signed)
Spent a great deal of time with pt discussing the timeline of his cath and subsequent surgery.  Advised of appt 4/18 with Dr Johnsie Cancel that was scheduled for him at the time of his last office visit.  He reports that is not a good day for him and he didn't know about the appt.  He will be at the [employer] at that time.  appt rescheduled at his request.  Advised the cath will be set up at that time.  Pt wants to work around multiple appts, his wife's schedule and his son's upcoming wedding.  Advised we will do our best to work with him and his schedule.  He thanked me for my time.

## 2014-12-21 ENCOUNTER — Ambulatory Visit (INDEPENDENT_AMBULATORY_CARE_PROVIDER_SITE_OTHER): Payer: BLUE CROSS/BLUE SHIELD | Admitting: Family Medicine

## 2014-12-21 ENCOUNTER — Encounter: Payer: Self-pay | Admitting: Family Medicine

## 2014-12-21 DIAGNOSIS — J309 Allergic rhinitis, unspecified: Secondary | ICD-10-CM

## 2014-12-21 DIAGNOSIS — I340 Nonrheumatic mitral (valve) insufficiency: Secondary | ICD-10-CM | POA: Diagnosis not present

## 2014-12-21 DIAGNOSIS — J069 Acute upper respiratory infection, unspecified: Secondary | ICD-10-CM | POA: Diagnosis not present

## 2014-12-21 NOTE — Progress Notes (Signed)
   Subjective:    Patient ID: Andrew Ayala, male    DOB: 09-24-1945, 69 y.o.   MRN: [id number]  HPI He complains of a three-day history of started with sore throat followed by swollen glands, sinus pressure and drainage with rhinorrhea. He then developed a cough that has become slightly productive. He continues on medications listed in the chart. He does have a history of allergic rhinitis.He was recently evaluated for his MVP and found to have severe mitral regurg. He has surgery scheduled in the next couple of months.   Review of Systems     Objective:   Physical Exam Alert and in no distress. Tympanic membranes and canals are normal. Pharyngeal area is normal. Neck is supple without adenopathy or thyromegaly. Cardiac exam shows a regular sinus rhythm with 3/6 murmur no gallops. Lungs are clear to auscultation.        Assessment & Plan:  Severe mitral regurgitation  Allergic rhinitis, mild  Acute URI supportive care. If he continues to have difficulty next week he will call for possible antibiotic.

## 2015-01-01 ENCOUNTER — Encounter: Payer: Self-pay | Admitting: Cardiovascular Disease

## 2015-01-01 ENCOUNTER — Encounter: Payer: Self-pay | Admitting: *Deleted

## 2015-01-01 ENCOUNTER — Ambulatory Visit (INDEPENDENT_AMBULATORY_CARE_PROVIDER_SITE_OTHER): Payer: BLUE CROSS/BLUE SHIELD | Admitting: Cardiovascular Disease

## 2015-01-01 VITALS — BP 108/64 | HR 55 | Ht 75.0 in | Wt 201.0 lb

## 2015-01-01 DIAGNOSIS — Z01812 Encounter for preprocedural laboratory examination: Secondary | ICD-10-CM | POA: Diagnosis not present

## 2015-01-01 DIAGNOSIS — I340 Nonrheumatic mitral (valve) insufficiency: Secondary | ICD-10-CM | POA: Diagnosis not present

## 2015-01-01 DIAGNOSIS — Z79899 Other long term (current) drug therapy: Secondary | ICD-10-CM

## 2015-01-01 NOTE — Progress Notes (Signed)
Patient ID: Andrew Ayala, male   DOB: 13-Dec-1945, 69 y.o.   MRN: [id number]    Cardiology Office Note   Date:  01/01/2015   ID:  Edward Guthmiller, DOB Jan 23, 1946, MRN [id number]  PCP:  Wyatt Haste, MD  Cardiologist:   Jenkins Rouge, MD   No chief complaint on file.     History of Present Illness: Andrew Ayala is a 69 y.o. male who presents for evaluation of MR.  Long standing history of MVP  Seen by [facility] about 12 years ago and told he was fine.  Despite murmur no f/u echo done since then.  Still working in [employer] but is otherwise sedentary.  No chest pain dyspnea palpitations or syncope CRF;s elevated lipids on statin No hisotry of rheumatic fever, connective tissue disease or CAD.  Had TTE done Echo done 09/12/14 reviewed   Study Conclusions  - Left ventricle: The cavity size was normal. Wall thickness was normal. Systolic function was normal. The estimated ejection fraction was in the range of 55% to 60%. - Mitral valve: Bileaflet prolapse worse in the posterior leaflet with more anteriorly directed MR. Degree of MR varies from parasternal to apical views but overall does appear severe Consider TEE to further evaluate since valve would be repairable - Left atrium: The atrium was mildly dilated. - Atrial septum: No defect or patent foramen ovale was identified.  F/U TEE done by me on 1/13 showed no flail but severe prolapse of middle scallop of P2 with PISA 1.3 and RV of 51cc No CHF, chest pain  Occasional palpitations and PAC;s noted during TEE No history of TIA/CVA  Reviewed TEE/TTE with patient.  Discussed issues of post repair decrease in LV , function, risk of afib and CHF.  Also discussed need for right and  Left heart cath prior to surgery.  He has seen Dr Roxy Manns in January and will proceed with surgery this summer  Has appt with Dr Roxy Manns 5/9 and I have scheduled Left heart cath for 5/20 in anticipation of surgery in June.    Has seen dentist   Teeth in good shape       Past Medical History  Diagnosis Date  . Sensory - neural hearing loss   . Vasomotor rhinitis   . Allergic rhinitis   . MVP (mitral valve prolapse)   . BPH (benign prostatic hyperplasia)   . Hyperlipidemia   . Severe mitral regurgitation 10/05/2014    Past Surgical History  Procedure Laterality Date  . Colonoscopy  2007    Dr. Henrene Pastor  . Hand surgery  2001    right  . Wart removal  1997    cryo surgery (penis)  . Wisdom tooth extraction  1994  . Septoplasty  1998  . Undescended testicle  1951  . Tee without cardioversion N/A 10/03/2014    Procedure: TRANSESOPHAGEAL ECHOCARDIOGRAM (TEE);  Surgeon: Josue Hector, MD;  Location: [facility] ENDOSCOPY;  Service: Cardiovascular;  Laterality: N/A;     Current Outpatient Prescriptions  Medication Sig Dispense Refill  . amoxicillin (AMOXIL) 500 MG capsule Take 2,000 mg by mouth See admin instructions. Take 4 capsules (2000 mg) one hour prior to appointment    . aspirin EC 81 MG tablet Take 81 mg by mouth daily before supper.    Marland Kitchen atorvastatin (LIPITOR) 10 MG tablet Take 10 mg by mouth daily before supper.    . finasteride (PROSCAR) 5 MG tablet Take 5 mg by mouth daily before supper.    Marland Kitchen  GLUCOSAMINE-CHONDROITIN PO Take 1 tablet by mouth daily. Glucosamine 1500 mg/ chrondroiton 1200 mg    . Multiple Vitamins-Minerals (MULTIVITAMIN WITH MINERALS) tablet Take 1 tablet by mouth daily.    . naproxen sodium (ALEVE) 220 MG tablet Take 220-440 mg by mouth 2 (two) times daily as needed (pain).    . Omega-3 Fatty Acids (FISH OIL) 1000 MG CAPS Take by mouth daily.    . vardenafil (LEVITRA) 10 MG tablet Take 1 tablet (10 mg total) by mouth daily as needed for erectile dysfunction. 10 tablet 11   No current facility-administered medications for this visit.    Allergies:   Review of patient's allergies indicates no known allergies.    Social History:  The patient  reports that he has never smoked. He has never used smokeless  tobacco. He reports that he does not drink alcohol or use illicit drugs.   Family History:  The patient's family history includes Colon cancer (age of onset: 30) in his paternal uncle; Colon cancer (age of onset: 75) in his paternal uncle; Heart attack in his father; Heart failure in his mother; Stroke in his sister. There is no history of Stomach cancer.    ROS:  Please see the history of present illness.   Otherwise, review of systems are positive for none.   All other systems are reviewed and negative.    PHYSICAL EXAM: VS:  BP 108/64 mmHg  Pulse 55  Ht 6\' 3"  (1.905 m)  Wt 201 lb (91.173 kg)  BMI 25.12 kg/m2 , BMI Body mass index is 25.12 kg/(m^2). GEN: Well nourished, well developed, in no acute distress HEENT: normal Neck: no JVD, carotid bruits, or masses Cardiac: RRR; Loud  Mid systolic MR  murmurs, rubs, or gallops,no edema  Respiratory:  clear to auscultation bilaterally, normal work of breathing GI: soft, nontender, nondistended, + BS MS: no deformity or atrophy Skin: warm and dry, no rash Neuro:  Strength and sensation are intact Psych: euthymic mood, full affect   EKG:   10/05/14  EKG  SR rate 56 normal     Recent Labs: 09/10/2014: ALT 23; BUN 16; Creatinine 0.80; Hemoglobin 15.1; Platelets 168; Potassium 3.8; Sodium 137    Lipid Panel    Component Value Date/Time   CHOL 151 09/10/2014 1535   TRIG 105 09/10/2014 1535   HDL 39* 09/10/2014 1535   CHOLHDL 3.9 09/10/2014 1535   VLDL 21 09/10/2014 1535   LDLCALC 91 09/10/2014 1535      Wt Readings from Last 3 Encounters:  01/01/15 201 lb (91.173 kg)  10/29/14 202 lb (91.627 kg)  10/05/14 202 lb 9.6 oz (91.899 kg)      Other studies Reviewed: Additional studies/ records that were reviewed today include: TEE/TTE. Review of the above records demonstrates: severe MR  Plan:  MR:  All patients questions answered.  He has medicare part B and would like to move surgery up to June.  Have arranged f/u with Dr  Roxy Manns 5/9 Called lab and orders written for left heart cath to r/o CAD on Friday 5/20.  Don't think right heart cath needed  Will use left radial approach to  Look at cors.  Patient will see dentist before visit with Dr Roxy Manns but his dentition are in good shape     Current medicines are reviewed at length with the patient today.  The patient has concerns regarding medicines.  The following changes have been made:  no change  Labs/ tests ordered today include:  No  orders of the defined types were placed in this encounter.     Disposition:    F/U with me post op MV repair    Signed, Jenkins Rouge, MD  01/01/2015 8:35 AM    [address], [place], [place]  [address] Phone: [phone]; Fax: [phone]

## 2015-01-01 NOTE — Patient Instructions (Addendum)
Your physician has requested that you have a cardiac catheterization. Cardiac catheterization is used to diagnose and/or treat various heart conditions. Doctors may recommend this procedure for a number of different reasons. The most common reason is to evaluate chest pain. Chest pain can be a symptom of coronary artery disease (CAD), and cardiac catheterization can show whether plaque is narrowing or blocking your heart's arteries. This procedure is also used to evaluate the valves, as well as measure the blood flow and oxygen levels in different parts of your heart. For further information please visit [email or web address]. Please follow instruction sheet, as given.    02-08-15  WITH  DR [facility]   Your physician recommends that you continue on your current medications as directed. Please refer to the Current Medication list given to you today.   Your physician recommends that you return for lab work in:  [facility]  [place]  [phone]

## 2015-01-07 ENCOUNTER — Ambulatory Visit: Payer: BLUE CROSS/BLUE SHIELD | Admitting: Cardiovascular Disease

## 2015-01-21 ENCOUNTER — Telehealth: Payer: Self-pay | Admitting: Cardiovascular Disease

## 2015-01-21 NOTE — Telephone Encounter (Signed)
New Message   Please give patient a call in regards to his procedure that is scheduled on Feb 08, 2015 @ 9am with Dr. Johnsie Cancel.

## 2015-01-21 NOTE — Telephone Encounter (Signed)
Patient requesting information regarding the cardiac cath procedure that he is having on May 20th. He asked several questions regarding recovery time in hospital, post cath activity restrictions, length of time of procedure, location of cath site, and would having restless leg syndrome put him at risk for post cath bleeding. Provided education on procedure, pre-during-after, and answered his specific questions. He verbalized understanding and appreciation for the information.

## 2015-01-22 ENCOUNTER — Encounter: Payer: Self-pay | Admitting: Family Medicine

## 2015-01-22 ENCOUNTER — Ambulatory Visit (INDEPENDENT_AMBULATORY_CARE_PROVIDER_SITE_OTHER): Payer: BLUE CROSS/BLUE SHIELD | Admitting: Family Medicine

## 2015-01-22 VITALS — BP 110/80 | HR 70 | Wt 202.6 lb

## 2015-01-22 DIAGNOSIS — J309 Allergic rhinitis, unspecified: Secondary | ICD-10-CM

## 2015-01-22 NOTE — Patient Instructions (Signed)
Use Flonase, Claritin-D or Allegra-D.Marland Kitchen Use the Afrin during the day when you need to

## 2015-01-22 NOTE — Progress Notes (Signed)
   Subjective:    Patient ID: Andrew Ayala, male    DOB: 1946/06/21, 69 y.o.   MRN: [id number]  HPI He complains of a 4 day history of nasal congestion, ear congestion, slight sneezing but no fever, chills, sore throat. He has been exposed to a lot of dust and mold while cleaning up his mother-in-law's house. He has been using Afrin and Sudafed.   Review of Systems     Objective:   Physical Exam Alert and in no distress. Tympanic membranes and canals are normal. Pharyngeal area is normal. Neck is supple without adenopathy or thyromegaly. Cardiac exam shows a regular sinus rhythm without murmurs or gallops. Lungs are clear to auscultation.       Assessment & Plan:  Allergic rhinitis, mild recommend Flonase nasal spray as well as either Ariton the or Allegra-D and judicious use of Afrin especially during the day to help with work-related hearing difficulties from the ear congestion.

## 2015-01-28 ENCOUNTER — Ambulatory Visit: Payer: Medicare Other | Admitting: Thoracic Surgery (Cardiothoracic Vascular Surgery)

## 2015-01-30 ENCOUNTER — Telehealth: Payer: Self-pay | Admitting: Cardiovascular Disease

## 2015-01-30 NOTE — Telephone Encounter (Signed)
New message         Pt would like to know if he is ok to go to [place] the day after heart cath

## 2015-02-01 NOTE — Telephone Encounter (Signed)
PT  AWARE  SHOULD BE FINE  TO GO TO [place] THE  FOLLOWING DAY  .Adonis Housekeeper

## 2015-02-04 ENCOUNTER — Ambulatory Visit: Payer: BLUE CROSS/BLUE SHIELD | Admitting: Thoracic Surgery (Cardiothoracic Vascular Surgery)

## 2015-02-06 ENCOUNTER — Other Ambulatory Visit (INDEPENDENT_AMBULATORY_CARE_PROVIDER_SITE_OTHER): Payer: BLUE CROSS/BLUE SHIELD | Admitting: *Deleted

## 2015-02-06 DIAGNOSIS — I340 Nonrheumatic mitral (valve) insufficiency: Secondary | ICD-10-CM

## 2015-02-06 DIAGNOSIS — Z01812 Encounter for preprocedural laboratory examination: Secondary | ICD-10-CM | POA: Diagnosis not present

## 2015-02-06 DIAGNOSIS — Z79899 Other long term (current) drug therapy: Secondary | ICD-10-CM | POA: Diagnosis not present

## 2015-02-06 LAB — CBC WITH DIFFERENTIAL/PLATELET
Basophils Absolute: 0 10*3/uL (ref 0.0–0.1)
Basophils Relative: 0.8 % (ref 0.0–3.0)
EOS ABS: 0.2 10*3/uL (ref 0.0–0.7)
EOS PCT: 3.5 % (ref 0.0–5.0)
HCT: 41.5 % (ref 39.0–52.0)
Hemoglobin: 14.5 g/dL (ref 13.0–17.0)
LYMPHS PCT: 31.8 % (ref 12.0–46.0)
Lymphs Abs: 1.8 10*3/uL (ref 0.7–4.0)
MCHC: 35 g/dL (ref 30.0–36.0)
MCV: 95.5 fl (ref 78.0–100.0)
MONO ABS: 0.6 10*3/uL (ref 0.1–1.0)
Monocytes Relative: 11.4 % (ref 3.0–12.0)
NEUTROS PCT: 52.5 % (ref 43.0–77.0)
Neutro Abs: 3 10*3/uL (ref 1.4–7.7)
PLATELETS: 146 10*3/uL — AB (ref 150.0–400.0)
RBC: 4.34 Mil/uL (ref 4.22–5.81)
RDW: 13 % (ref 11.5–15.5)
WBC: 5.7 10*3/uL (ref 4.0–10.5)

## 2015-02-06 LAB — BASIC METABOLIC PANEL
BUN: 14 mg/dL (ref 6–23)
CO2: 29 mEq/L (ref 19–32)
Calcium: 9.3 mg/dL (ref 8.4–10.5)
Chloride: 103 mEq/L (ref 96–112)
Creatinine, Ser: 1.03 mg/dL (ref 0.40–1.50)
GFR: 76.38 mL/min (ref >60.00)
Glucose, Bld: 86 mg/dL (ref 70–99)
POTASSIUM: 4 meq/L (ref 3.5–5.1)
Sodium: 137 mEq/L (ref 135–145)

## 2015-02-06 LAB — PROTIME-INR
INR: 1 ratio (ref 0.8–1.0)
Prothrombin Time: 11 s (ref 9.6–13.1)

## 2015-02-08 ENCOUNTER — Encounter (HOSPITAL_COMMUNITY): Admission: RE | Payer: Self-pay | Source: Ambulatory Visit

## 2015-02-08 ENCOUNTER — Ambulatory Visit (HOSPITAL_COMMUNITY)
Admission: RE | Admit: 2015-02-08 | Discharge: 2015-02-08 | Disposition: A | Discharge status: Home / Self Care | Payer: BLUE CROSS/BLUE SHIELD | Source: Ambulatory Visit | Attending: Cardiovascular Disease | Admitting: Cardiovascular Disease

## 2015-02-08 ENCOUNTER — Ambulatory Visit (HOSPITAL_COMMUNITY)
Admission: RE | Admit: 2015-02-08 | Payer: BLUE CROSS/BLUE SHIELD | Source: Ambulatory Visit | Admitting: Cardiovascular Disease

## 2015-02-08 ENCOUNTER — Encounter (HOSPITAL_COMMUNITY)
Admission: RE | Disposition: A | Discharge status: Home / Self Care | Payer: BLUE CROSS/BLUE SHIELD | Source: Ambulatory Visit | Attending: Cardiovascular Disease

## 2015-02-08 DIAGNOSIS — I340 Nonrheumatic mitral (valve) insufficiency: Secondary | ICD-10-CM | POA: Insufficient documentation

## 2015-02-08 DIAGNOSIS — E785 Hyperlipidemia, unspecified: Secondary | ICD-10-CM | POA: Insufficient documentation

## 2015-02-08 DIAGNOSIS — N400 Enlarged prostate without lower urinary tract symptoms: Secondary | ICD-10-CM | POA: Insufficient documentation

## 2015-02-08 HISTORY — PX: CARDIAC CATHETERIZATION: SHX172

## 2015-02-08 SURGERY — LEFT HEART CATHETERIZATION WITH CORONARY ANGIOGRAM
Anesthesia: LOCAL

## 2015-02-08 SURGERY — LEFT HEART CATH AND CORONARY ANGIOGRAPHY

## 2015-02-08 MED ORDER — SODIUM CHLORIDE 0.9 % IJ SOLN
3.0000 mL | Freq: Two times a day (BID) | INTRAMUSCULAR | Status: DC
Start: 2015-02-08 — End: 2015-02-08

## 2015-02-08 MED ORDER — HEPARIN (PORCINE) IN NACL 2-0.9 UNIT/ML-% IJ SOLN
INTRAMUSCULAR | Status: AC
  Filled 2015-02-08: qty 1500

## 2015-02-08 MED ORDER — MIDAZOLAM HCL 2 MG/2ML IJ SOLN
INTRAMUSCULAR | Status: DC | PRN
  Administered 2015-02-08: 2 mg via INTRAVENOUS

## 2015-02-08 MED ORDER — HEPARIN SODIUM (PORCINE) 1000 UNIT/ML IJ SOLN
INTRAMUSCULAR | Status: AC
  Filled 2015-02-08: qty 1

## 2015-02-08 MED ORDER — SODIUM CHLORIDE 0.9 % IJ SOLN: 3.0000 mL | Freq: Two times a day (BID) | INTRAMUSCULAR | Status: DC

## 2015-02-08 MED ORDER — SODIUM CHLORIDE 0.9 % IJ SOLN: 3.0000 mL | INTRAMUSCULAR | Status: DC | PRN

## 2015-02-08 MED ORDER — SODIUM CHLORIDE 0.9 % IV SOLN: INTRAVENOUS | Status: DC

## 2015-02-08 MED ORDER — SODIUM CHLORIDE 0.9 % IV SOLN: 250.0000 mL | INTRAVENOUS | Status: DC | PRN

## 2015-02-08 MED ORDER — MIDAZOLAM HCL 2 MG/2ML IJ SOLN
INTRAMUSCULAR | Status: AC
  Filled 2015-02-08: qty 2

## 2015-02-08 MED ORDER — VERAPAMIL HCL 2.5 MG/ML IV SOLN
INTRAVENOUS | Status: AC
  Filled 2015-02-08: qty 2

## 2015-02-08 MED ORDER — FENTANYL CITRATE (PF) 100 MCG/2ML IJ SOLN
INTRAMUSCULAR | Status: AC
  Filled 2015-02-08: qty 2

## 2015-02-08 MED ORDER — IOHEXOL 350 MG/ML SOLN
INTRAVENOUS | Status: DC | PRN
  Administered 2015-02-08: 70 mL via INTRAVENOUS

## 2015-02-08 MED ORDER — LIDOCAINE HCL (PF) 1 % IJ SOLN
INTRAMUSCULAR | Status: AC
  Filled 2015-02-08: qty 30

## 2015-02-08 MED ORDER — ASPIRIN 81 MG PO CHEW: 81.0000 mg | CHEWABLE_TABLET | ORAL | Status: DC

## 2015-02-08 MED ORDER — OXYCODONE-ACETAMINOPHEN 5-325 MG PO TABS: 1.0000 | ORAL_TABLET | ORAL | Status: DC | PRN

## 2015-02-08 MED ORDER — HEPARIN SODIUM (PORCINE) 1000 UNIT/ML IJ SOLN
INTRAMUSCULAR | Status: DC | PRN
  Administered 2015-02-08: 4500 [IU] via INTRAVENOUS

## 2015-02-08 MED ORDER — SODIUM CHLORIDE 0.9 % WEIGHT BASED INFUSION
3.0000 mL/kg/h | INTRAVENOUS | Status: DC
  Administered 2015-02-08: 3 mL/kg/h via INTRAVENOUS

## 2015-02-08 MED ORDER — FENTANYL CITRATE (PF) 100 MCG/2ML IJ SOLN
INTRAMUSCULAR | Status: DC | PRN
  Administered 2015-02-08: 25 ug via INTRAVENOUS

## 2015-02-08 MED ORDER — VERAPAMIL HCL 2.5 MG/ML IV SOLN
INTRAVENOUS | Status: DC | PRN
  Administered 2015-02-08: 09:00:00 via INTRA_ARTERIAL

## 2015-02-08 MED ORDER — ASPIRIN 81 MG PO CHEW: 81.0000 mg | CHEWABLE_TABLET | Freq: Every day | ORAL | Status: DC

## 2015-02-08 MED ORDER — DIAZEPAM 2 MG PO TABS
2.0000 mg | ORAL_TABLET | ORAL | Status: DC | PRN
Start: 2015-02-08 — End: 2015-02-08

## 2015-02-08 SURGICAL SUPPLY — 11 items

## 2015-02-08 NOTE — Discharge Instructions (Signed)
Radial Site Care °Refer to this sheet in the next few weeks. These instructions provide you with information on caring for yourself after your procedure. Your caregiver may also give you more specific instructions. Your treatment has been planned according to current medical practices, but problems sometimes occur. Call your caregiver if you have any problems or questions after your procedure. °HOME CARE INSTRUCTIONS °· You may shower the day after the procedure. Remove the bandage (dressing) and gently wash the site with plain soap and water. Gently pat the site dry. °· Do not apply powder or lotion to the site. °· Do not submerge the affected site in water for 3 to 5 days. °· Inspect the site at least twice daily. °· Do not flex or bend the affected arm for 24 hours. °· No lifting over 5 pounds (2.3 kg) for 5 days after your procedure. °· Do not drive home if you are discharged the same day of the procedure. Have someone else drive you. °· You may drive 24 hours after the procedure unless otherwise instructed by your caregiver. °· Do not operate machinery or power tools for 24 hours. °· A responsible adult should be with you for the first 24 hours after you arrive home. °What to expect: °· Any bruising will usually fade within 1 to 2 weeks. °· Blood that collects in the tissue (hematoma) may be painful to the touch. It should usually decrease in size and tenderness within 1 to 2 weeks. °SEEK IMMEDIATE MEDICAL CARE IF: °· You have unusual pain at the radial site. °· You have redness, warmth, swelling, or pain at the radial site. °· You have drainage (other than a small amount of blood on the dressing). °· You have chills. °· You have a fever or persistent symptoms for more than 72 hours. °· You have a fever and your symptoms suddenly get worse. °· Your arm becomes pale, cool, tingly, or numb. °· You have heavy bleeding from the site. Hold pressure on the site. °Document Released: 10/10/2010 Document Revised:  11/30/2011 Document Reviewed: 10/10/2010 °ExitCare® Patient Information ©2015 [employer]. This information is not intended to replace advice given to you by your health care provider. Make sure you discuss any questions you have with your health care provider. ° °

## 2015-02-08 NOTE — H&P (Signed)
Patient ID: Andrew Ayala, male DOB: 06/01/1946, 69 y.o. MRN: [id number]    Cardiology Office Note   Date: 01/01/2015   ID: Andrew Ayala, DOB 17-Mar-1946, MRN [id number]  PCP: Wyatt Haste, MD Cardiologist: Jenkins Rouge, MD   No chief complaint on file.    History of Present Illness: Andrew Ayala is a 69 y.o. male who presents for evaluation of MR. Long standing history of MVP Seen by [facility] about 12 years ago and told he was fine. Despite murmur no f/u echo done since then. Still working in [employer] but is otherwise sedentary. No chest pain dyspnea palpitations or syncope CRF;s elevated lipids on statin No hisotry of rheumatic fever, connective tissue disease or CAD. Had TTE done Echo done 09/12/14 reviewed   Study Conclusions  - Left ventricle: The cavity size was normal. Wall thickness was normal. Systolic function was normal. The estimated ejection fraction was in the range of 55% to 60%. - Mitral valve: Bileaflet prolapse worse in the posterior leaflet with more anteriorly directed MR. Degree of MR varies from parasternal to apical views but overall does appear severe Consider TEE to further evaluate since valve would be repairable - Left atrium: The atrium was mildly dilated. - Atrial septum: No defect or patent foramen ovale was identified.  F/U TEE done by me on 1/13 showed no flail but severe prolapse of middle scallop of P2 with PISA 1.3 and RV of 51cc No CHF, chest pain Occasional palpitations and PAC;s noted during TEE No history of TIA/CVA  Reviewed TEE/TTE with patient. Discussed issues of post repair decrease in LV , function, risk of afib and CHF. Also discussed need for right and  Left heart cath prior to surgery. He has seen Dr Roxy Manns in January and will proceed with surgery this summer Has appt with Dr Roxy Manns 5/9 and I have scheduled Left heart cath for 5/20 in anticipation of surgery in June.   Has  seen dentist Teeth in good shape     Past Medical History  Diagnosis Date  . Sensory - neural hearing loss   . Vasomotor rhinitis   . Allergic rhinitis   . MVP (mitral valve prolapse)   . BPH (benign prostatic hyperplasia)   . Hyperlipidemia   . Severe mitral regurgitation 10/05/2014    Past Surgical History  Procedure Laterality Date  . Colonoscopy  2007    Dr. Henrene Pastor  . Hand surgery  2001    right  . Wart removal  1997    cryo surgery (penis)  . Wisdom tooth extraction  1994  . Septoplasty  1998  . Undescended testicle  1951  . Tee without cardioversion N/A 10/03/2014    Procedure: TRANSESOPHAGEAL ECHOCARDIOGRAM (TEE); Surgeon: Josue Hector, MD; Location: [facility] ENDOSCOPY; Service: Cardiovascular; Laterality: N/A;     Current Outpatient Prescriptions  Medication Sig Dispense Refill  . amoxicillin (AMOXIL) 500 MG capsule Take 2,000 mg by mouth See admin instructions. Take 4 capsules (2000 mg) one hour prior to appointment    . aspirin EC 81 MG tablet Take 81 mg by mouth daily before supper.    Marland Kitchen atorvastatin (LIPITOR) 10 MG tablet Take 10 mg by mouth daily before supper.    . finasteride (PROSCAR) 5 MG tablet Take 5 mg by mouth daily before supper.    Marland Kitchen GLUCOSAMINE-CHONDROITIN PO Take 1 tablet by mouth daily. Glucosamine 1500 mg/ chrondroiton 1200 mg    . Multiple Vitamins-Minerals (MULTIVITAMIN WITH MINERALS) tablet Take 1 tablet by mouth daily.    Marland Kitchen  naproxen sodium (ALEVE) 220 MG tablet Take 220-440 mg by mouth 2 (two) times daily as needed (pain).    . Omega-3 Fatty Acids (FISH OIL) 1000 MG CAPS Take by mouth daily.    . vardenafil (LEVITRA) 10 MG tablet Take 1 tablet (10 mg total) by mouth daily as needed for erectile dysfunction. 10 tablet 11   No current facility-administered medications for this visit.    Allergies: Review of  patient's allergies indicates no known allergies.    Social History: The patient  reports that he has never smoked. He has never used smokeless tobacco. He reports that he does not drink alcohol or use illicit drugs.   Family History: The patient's family history includes Colon cancer (age of onset: 70) in his paternal uncle; Colon cancer (age of onset: 93) in his paternal uncle; Heart attack in his father; Heart failure in his mother; Stroke in his sister. There is no history of Stomach cancer.    ROS: Please see the history of present illness. Otherwise, review of systems are positive for none. All other systems are reviewed and negative.    PHYSICAL EXAM: VS: BP 108/64 mmHg  Pulse 55  Ht 6\' 3"  (1.905 m)  Wt 201 lb (91.173 kg)  BMI 25.12 kg/m2 , BMI Body mass index is 25.12 kg/(m^2). GEN: Well nourished, well developed, in no acute distress  HEENT: normal  Neck: no JVD, carotid bruits, or masses Cardiac: RRR; Loud Mid systolic MR murmurs, rubs, or gallops,no edema  Respiratory: clear to auscultation bilaterally, normal work of breathing GI: soft, nontender, nondistended, + BS MS: no deformity or atrophy  Skin: warm and dry, no rash Neuro: Strength and sensation are intact Psych: euthymic mood, full affect   EKG: 10/05/14 EKG SR rate 56 normal     Recent Labs: 09/10/2014: ALT 23; BUN 16; Creatinine 0.80; Hemoglobin 15.1; Platelets 168; Potassium 3.8; Sodium 137    Lipid Panel  Labs (Brief)       Component Value Date/Time   CHOL 151 09/10/2014 1535   TRIG 105 09/10/2014 1535   HDL 39* 09/10/2014 1535   CHOLHDL 3.9 09/10/2014 1535   VLDL 21 09/10/2014 1535   LDLCALC 91 09/10/2014 1535       Wt Readings from Last 3 Encounters:  01/01/15 201 lb (91.173 kg)  10/29/14 202 lb (91.627 kg)  10/05/14 202 lb 9.6 oz (91.899 kg)      Other studies Reviewed: Additional studies/ records that were reviewed today  include: TEE/TTE. Review of the above records demonstrates: severe MR  Plan:  MR: All patients questions answered. He has medicare part B and would like to move surgery up to June. Have arranged f/u with Dr Roxy Manns 5/9 Called lab and orders written for left heart cath to r/o CAD on Friday 5/20. Don't think right heart cath needed Will use left radial approach to  Look at cors. Patient will see dentist before visit with Dr Roxy Manns but his dentition are in good shape     Current medicines are reviewed at length with the patient today. The patient has concerns regarding medicines.  The following changes have been made: no change  Labs/ tests ordered today include:  No orders of the defined types were placed in this encounter.    Disposition: F/U with me post op MV repair    Signed, Jenkins Rouge, MD  01/01/2015 8:35 AM  [address], [place], [place] [address] Phone: [phone]; Fax: [phone]         

## 2015-02-11 ENCOUNTER — Encounter (HOSPITAL_COMMUNITY): Payer: Self-pay | Admitting: Cardiovascular Disease

## 2015-02-11 ENCOUNTER — Ambulatory Visit (INDEPENDENT_AMBULATORY_CARE_PROVIDER_SITE_OTHER): Payer: BLUE CROSS/BLUE SHIELD | Admitting: Thoracic Surgery (Cardiothoracic Vascular Surgery)

## 2015-02-11 ENCOUNTER — Other Ambulatory Visit: Payer: Self-pay | Admitting: *Deleted

## 2015-02-11 VITALS — BP 111/73 | HR 64 | Resp 20 | Ht 75.0 in | Wt 200.0 lb

## 2015-02-11 DIAGNOSIS — I7409 Other arterial embolism and thrombosis of abdominal aorta: Secondary | ICD-10-CM

## 2015-02-11 DIAGNOSIS — I340 Nonrheumatic mitral (valve) insufficiency: Secondary | ICD-10-CM

## 2015-02-11 DIAGNOSIS — I341 Nonrheumatic mitral (valve) prolapse: Secondary | ICD-10-CM | POA: Diagnosis not present

## 2015-02-11 DIAGNOSIS — I719 Aortic aneurysm of unspecified site, without rupture: Secondary | ICD-10-CM

## 2015-02-11 MED ORDER — AMIODARONE HCL 200 MG PO TABS: 200.0000 mg | ORAL_TABLET | Freq: Two times a day (BID) | ORAL | Status: DC

## 2015-02-11 MED FILL — Heparin Sodium (Porcine) 2 Unit/ML in Sodium Chloride 0.9%: INTRAMUSCULAR | Qty: 1500 | Status: AC

## 2015-02-11 MED FILL — Lidocaine HCl Local Preservative Free (PF) Inj 1%: INTRAMUSCULAR | Qty: 30 | Status: AC

## 2015-02-11 NOTE — Patient Instructions (Signed)
Patient should begin taking amiodarone 7 days before your scheduled surgery  Patient should continue taking all other medications without change through the day before surgery.  Patient should have nothing to eat or drink after midnight the night before surgery.  On the morning of surgery patient should not take any of his current medications.

## 2015-02-11 NOTE — Progress Notes (Signed)
[address]       [place] [address]             [phone]     CARDIOTHORACIC SURGERY OFFICE NOTE  Referring Provider is Josue Hector, MD PCP is Wyatt Haste, MD   HPI:  Patient returns to the office today for follow-up of stage C severe asymptomatic primary mitral regurgitation. He was originally seen in consultation on 10/29/2014. Since then he underwent diagnostic cardiac catheterization by Dr. Johnsie Cancel on 02/08/2015. This revealed the presence of normal coronary artery anatomy with no significant coronary artery disease. There was severe mitral regurgitation with normal left ventricular systolic function. Right heart catheterization was not performed. The patient returns to the office today to discuss the results of this procedure and make final plans for elective mitral valve repair. He reports no new problems or complaints over the last 3 months. He specifically continues to deny any associated symptoms of exertional shortness of breath or chest discomfort.   Current Outpatient Prescriptions  Medication Sig Dispense Refill  . amoxicillin (AMOXIL) 500 MG capsule Take 2,000 mg by mouth See admin instructions. Take 4 capsules (2000 mg) one hour prior to appointment    . aspirin EC 81 MG tablet Take 81 mg by mouth daily before supper.    Marland Kitchen atorvastatin (LIPITOR) 10 MG tablet Take 10 mg by mouth daily before supper.    . Eyelid Cleansers (OCUSOFT LID SCRUB EX) Apply topically every morning.    . finasteride (PROSCAR) 5 MG tablet Take 5 mg by mouth daily before supper.    Marland Kitchen GLUCOSAMINE-CHONDROITIN PO Take 1 tablet by mouth daily. Glucosamine 1500 mg/ chrondroiton 1200 mg    . ketotifen (ZADITOR) 0.025 % ophthalmic solution Apply 1 drop to eye daily as needed (Itchy Eyes).    Marland Kitchen loratadine-pseudoephedrine (CLARITIN-D 24-HOUR) 10-240 MG per 24 hr tablet Take 1 tablet by mouth daily as needed for allergies.    . Multiple Vitamins-Minerals (MULTIVITAMIN WITH  MINERALS) tablet Take 1 tablet by mouth daily.    . naproxen sodium (ALEVE) 220 MG tablet Take 220-440 mg by mouth 2 (two) times daily as needed (pain).    . Omega-3 Fatty Acids (FISH OIL) 1200 MG CAPS Take 1 capsule by mouth at bedtime.    . vardenafil (LEVITRA) 10 MG tablet Take 1 tablet (10 mg total) by mouth daily as needed for erectile dysfunction. 10 tablet 11   No current facility-administered medications for this visit.      Physical Exam:   BP 111/73 mmHg  Pulse 64  Resp 20  Ht 6\' 3"  (1.905 m)  Wt 200 lb (90.719 kg)  BMI 25.00 kg/m2  SpO2 98%  General:  Well-appearing  Chest:   clear  CV:   Regular rate and rhythm with holosystolic murmur  Incisions:  n/a  Abdomen:  Soft and nontender  Extremities:  Warm and well-perfused  Diagnostic Tests:  CARDIAC CATHETERIZATION  Conclusion    Normal right dominant coronary arteries Severe MR due to prolapse Has f/u with Dr Roxy Manns for MV repair    Impression:  Patient has mitral valve prolapse with stage C severe asymptomatic primary mitral regurgitation. Left ventricular size and systolic function remains preserved. The patient has typical myxomatous disease with what may be a small flail segment involving the posterior leaflet. Alternatives include continued medical therapy with close follow-up versus proceeding with elective mitral valve repair in the near future. There are no other complicating features and  I feel there is a greater than 95% likelihood his valve should be repairable with expectations for very low operative risk and excellent long-term result. diagnostic cardiac catheterization is notable for the absence of significant coronary artery disease.  The patient appears to be a good candidate for minimally invasive approach for surgery.  Plan:  The patient and his wife were counseled at length regarding the indications, risks and potential benefits of mitral valve repair.  The rationale for elective surgery has  been explained, including a comparison between surgery and continued medical therapy with close follow-up.  The likelihood of successful and durable valve repair has been discussed with particular reference to the findings of their recent echocardiogram.  Based upon these findings and previous experience, I have quoted them a greater than 95 percent likelihood of successful valve repair.  In the unlikely event that their valve cannot be successfully repaired, we discussed the possibility of replacing the mitral valve using a mechanical prosthesis with the attendant need for long-term anticoagulation versus the alternative of replacing it using a bioprosthetic tissue valve with its potential for late structural valve deterioration and failure, depending upon the patient's longevity.  The patient specifically requests that if the mitral valve must be replaced that it be done using a mechanical valve.   The patient understands and accepts all potential risks of surgery including but not limited to risk of death, stroke or other neurologic complication, myocardial infarction, congestive heart failure, respiratory failure, renal failure, bleeding requiring transfusion and/or reexploration, arrhythmia, infection or other wound complications, pneumonia, pleural and/or pericardial effusion, pulmonary embolus, aortic dissection or other major vascular complication, or delayed complications related to valve repair or replacement including but not limited to structural valve deterioration and failure, thrombosis, embolization, endocarditis, or paravalvular leak.  Alternative surgical approaches have been discussed including a comparison between conventional sternotomy and minimally-invasive techniques.  The relative risks and benefits of each have been reviewed as they pertain to the patient's specific circumstances, and all of their questions have been addressed.  Specific risks potentially related to the minimally-invasive  approach were discussed at length, including but not limited to risk of conversion to full or partial sternotomy, aortic dissection or other major vascular complication, unilateral acute lung injury or pulmonary edema, phrenic nerve dysfunction or paralysis, rib fracture, chronic pain, lung hernia, or lymphocele.   All of their questions have been answered.  We plan to proceed with surgery on Wednesday, 02/27/2015. The patient has been given a prescription for amiodarone to begin 7 days prior to surgery to decrease his risk of perioperative atrial dysrhythmias.   I spent in excess of 30 minutes during the conduct of this office consultation and >50% of this time involved direct face-to-face encounter with the patient for counseling and/or coordination of their care.   Valentina Gu. Roxy Manns, MD 02/11/2015 1:01 PM

## 2015-02-14 ENCOUNTER — Ambulatory Visit: Payer: Medicare Other | Admitting: Thoracic Surgery (Cardiothoracic Vascular Surgery)

## 2015-02-19 ENCOUNTER — Ambulatory Visit
Admission: RE | Admit: 2015-02-19 | Discharge: 2015-02-19 | Disposition: A | Discharge status: Home / Self Care | Payer: BLUE CROSS/BLUE SHIELD | Source: Ambulatory Visit | Attending: Thoracic Surgery (Cardiothoracic Vascular Surgery) | Admitting: Thoracic Surgery (Cardiothoracic Vascular Surgery)

## 2015-02-19 DIAGNOSIS — I7409 Other arterial embolism and thrombosis of abdominal aorta: Secondary | ICD-10-CM

## 2015-02-19 DIAGNOSIS — I700 Atherosclerosis of aorta: Secondary | ICD-10-CM | POA: Diagnosis not present

## 2015-02-19 DIAGNOSIS — R918 Other nonspecific abnormal finding of lung field: Secondary | ICD-10-CM | POA: Diagnosis not present

## 2015-02-19 DIAGNOSIS — I719 Aortic aneurysm of unspecified site, without rupture: Secondary | ICD-10-CM

## 2015-02-19 DIAGNOSIS — I341 Nonrheumatic mitral (valve) prolapse: Secondary | ICD-10-CM | POA: Diagnosis not present

## 2015-02-19 MED ORDER — IOPAMIDOL (ISOVUE-370) INJECTION 76%
75.0000 mL | Freq: Once | INTRAVENOUS | Status: AC | PRN
  Administered 2015-02-19: 75 mL via INTRAVENOUS

## 2015-02-22 ENCOUNTER — Other Ambulatory Visit: Payer: Self-pay | Admitting: Thoracic Surgery (Cardiothoracic Vascular Surgery)

## 2015-02-22 DIAGNOSIS — Z736 Limitation of activities due to disability: Secondary | ICD-10-CM

## 2015-02-25 ENCOUNTER — Encounter (HOSPITAL_COMMUNITY)
Admission: RE | Admit: 2015-02-25 | Discharge: 2015-02-25 | Disposition: A | Discharge status: Home / Self Care | Payer: BLUE CROSS/BLUE SHIELD | Source: Ambulatory Visit | Attending: Thoracic Surgery (Cardiothoracic Vascular Surgery) | Admitting: Thoracic Surgery (Cardiothoracic Vascular Surgery)

## 2015-02-25 ENCOUNTER — Ambulatory Visit (HOSPITAL_COMMUNITY)
Admission: RE | Admit: 2015-02-25 | Discharge: 2015-02-25 | Disposition: A | Discharge status: Home / Self Care | Payer: BLUE CROSS/BLUE SHIELD | Source: Ambulatory Visit | Attending: Thoracic Surgery (Cardiothoracic Vascular Surgery) | Admitting: Thoracic Surgery (Cardiothoracic Vascular Surgery)

## 2015-02-25 ENCOUNTER — Encounter (HOSPITAL_COMMUNITY): Payer: Self-pay

## 2015-02-25 VITALS — BP 118/67 | HR 60 | Temp 97.9°F | Resp 20 | Ht 75.5 in | Wt 201.9 lb

## 2015-02-25 DIAGNOSIS — I340 Nonrheumatic mitral (valve) insufficiency: Secondary | ICD-10-CM

## 2015-02-25 HISTORY — DX: Rosacea, unspecified: L71.9

## 2015-02-25 HISTORY — DX: Cardiac murmur, unspecified: R01.1

## 2015-02-25 LAB — BLOOD GAS, ARTERIAL
ACID-BASE DEFICIT: 0.1 mmol/L (ref 0.0–2.0)
Bicarbonate: 23.5 mEq/L (ref 20.0–24.0)
Drawn by: 428831
FIO2: 0.21 %
O2 Saturation: 98.8 %
PATIENT TEMPERATURE: 98.6
TCO2: 24.5 mmol/L (ref 0–100)
pCO2 arterial: 34.6 mmHg — ABNORMAL LOW (ref 35.0–45.0)
pH, Arterial: 7.446 (ref 7.350–7.450)
pO2, Arterial: 131 mmHg — ABNORMAL HIGH (ref 80.0–100.0)

## 2015-02-25 LAB — PULMONARY FUNCTION TEST
DL/VA % PRED: 77 %
DL/VA: 3.78 ml/min/mmHg/L
DLCO UNC % PRED: 79 %
DLCO UNC: 31.01 ml/min/mmHg
FEF 25-75 PRE: 3.11 L/s
FEF 25-75 Post: 3.49 L/sec
FEF2575-%Change-Post: 11 %
FEF2575-%PRED-POST: 113 %
FEF2575-%PRED-PRE: 101 %
FEV1-%Change-Post: 2 %
FEV1-%PRED-POST: 114 %
FEV1-%Pred-Pre: 112 %
FEV1-Post: 4.58 L
FEV1-Pre: 4.49 L
FEV1FVC-%Change-Post: 3 %
FEV1FVC-%Pred-Pre: 97 %
FEV6-%CHANGE-POST: -1 %
FEV6-%Pred-Post: 117 %
FEV6-%Pred-Pre: 118 %
FEV6-Post: 6 L
FEV6-Pre: 6.08 L
FEV6FVC-%Change-Post: 0 %
FEV6FVC-%Pred-Post: 102 %
FEV6FVC-%Pred-Pre: 102 %
FVC-%Change-Post: -1 %
FVC-%Pred-Post: 114 %
FVC-%Pred-Pre: 115 %
FVC-POST: 6.15 L
FVC-Pre: 6.23 L
POST FEV1/FVC RATIO: 74 %
Post FEV6/FVC ratio: 98 %
Pre FEV1/FVC ratio: 72 %
Pre FEV6/FVC Ratio: 97 %
RV % PRED: 54 %
RV: 1.47 L
TLC % PRED: 104 %
TLC: 8.41 L

## 2015-02-25 LAB — SURGICAL PCR SCREEN
MRSA, PCR: NEGATIVE
STAPHYLOCOCCUS AUREUS: POSITIVE — AB

## 2015-02-25 LAB — URINALYSIS, ROUTINE W REFLEX MICROSCOPIC
Bilirubin Urine: NEGATIVE
Glucose, UA: NEGATIVE mg/dL
HGB URINE DIPSTICK: NEGATIVE
KETONES UR: NEGATIVE mg/dL
LEUKOCYTES UA: NEGATIVE
Nitrite: NEGATIVE
Protein, ur: NEGATIVE mg/dL
Specific Gravity, Urine: 1.02 (ref 1.005–1.030)
Urobilinogen, UA: 0.2 mg/dL (ref 0.0–1.0)
pH: 5 (ref 5.0–8.0)

## 2015-02-25 LAB — COMPREHENSIVE METABOLIC PANEL
ALBUMIN: 4.1 g/dL (ref 3.5–5.0)
ALT: 21 U/L (ref 17–63)
AST: 21 U/L (ref 15–41)
Alkaline Phosphatase: 63 U/L (ref 38–126)
Anion gap: 8 (ref 5–15)
BUN: 14 mg/dL (ref 6–20)
CO2: 22 mmol/L (ref 22–32)
Calcium: 9 mg/dL (ref 8.9–10.3)
Chloride: 107 mmol/L (ref 101–111)
Creatinine, Ser: 0.95 mg/dL (ref 0.61–1.24)
GFR calc Af Amer: >60 mL/min (ref >60)
GLUCOSE: 94 mg/dL (ref 65–99)
Potassium: 4 mmol/L (ref 3.5–5.1)
SODIUM: 137 mmol/L (ref 135–145)
Total Bilirubin: 0.7 mg/dL (ref 0.3–1.2)
Total Protein: 6.9 g/dL (ref 6.5–8.1)

## 2015-02-25 LAB — CBC
HCT: 40.7 % (ref 39.0–52.0)
Hemoglobin: 14.6 g/dL (ref 13.0–17.0)
MCH: 33.5 pg (ref 26.0–34.0)
MCHC: 35.9 g/dL (ref 30.0–36.0)
MCV: 93.3 fL (ref 78.0–100.0)
Platelets: 148 10*3/uL — ABNORMAL LOW (ref 150–400)
RBC: 4.36 MIL/uL (ref 4.22–5.81)
RDW: 12.1 % (ref 11.5–15.5)
WBC: 6 10*3/uL (ref 4.0–10.5)

## 2015-02-25 LAB — APTT: aPTT: 32 seconds (ref 24–37)

## 2015-02-25 LAB — TYPE AND SCREEN
ABO/RH(D): AB POS
ANTIBODY SCREEN: NEGATIVE

## 2015-02-25 LAB — ABO/RH: ABO/RH(D): AB POS

## 2015-02-25 LAB — PROTIME-INR
INR: 1.01 (ref 0.00–1.49)
Prothrombin Time: 13.5 seconds (ref 11.6–15.2)

## 2015-02-25 MED ORDER — ALBUTEROL SULFATE (2.5 MG/3ML) 0.083% IN NEBU
2.5000 mg | INHALATION_SOLUTION | Freq: Once | RESPIRATORY_TRACT | Status: AC
  Administered 2015-02-25: 2.5 mg via RESPIRATORY_TRACT

## 2015-02-25 NOTE — Progress Notes (Signed)
Pre-op Cardiac Surgery  Carotid Findings:  1-39% ICA stenosis.  Vertebral artery flow is antegrade.   Upper Extremity Right Left  Brachial Pressures 110T 112T  Radial Waveforms T T  Ulnar Waveforms T T  Palmar Arch (Allen's Test) WNL Doppler signal remains normal with radial compression and obliterates with ulnar compression   Findings:      Lower  Extremity Right Left  Dorsalis Pedis    Anterior Tibial    Posterior Tibial    Ankle/Brachial Indices      Findings:

## 2015-02-25 NOTE — Pre-Procedure Instructions (Signed)
Hyder Deman  02/25/2015     [id number] - [place], [place] - [address] AT [place] [address] [place] [place] [address] Phone: [phone] Fax: [phone]   Your procedure is scheduled on: Wednesday February 27, 2015 at 8:30 AM.  Report to [facility] Admitting at 6:30 A.M.  Call this number if you have problems the morning of surgery: [phone]    Remember:  Do not eat food or drink liquids after midnight.  Take these medicines the morning of surgery with A SIP OF WATER: Amiodarone (Pacerone)   Please stop taking any vitamins, Fish Oil, Glucosamine, Naproxen, Ibuprofen, Advil, etc   Do not wear jewelry  Do not wear lotions, powders, or cologne.  You may NOT wear deodorant.  Men may shave face and neck.  Do not bring valuables to the hospital.  [facility] is not responsible for any belongings or valuables.  Contacts, dentures or bridgework may not be worn into surgery.  Leave your suitcase in the car.  After surgery it may be brought to your room.  For patients admitted to the hospital, discharge time will be determined by your treatment team.  Patients discharged the day of surgery will not be allowed to drive home.   Name and phone number of your driver:    Special instructions: Shower using CHG soap the night before and the morning of your surgery  Please read over the following fact sheets that you were given. Pain Booklet, Coughing and Deep Breathing, Blood Transfusion Information, Open Heart Packet, MRSA Information and Surgical Site Infection Prevention

## 2015-02-25 NOTE — Progress Notes (Signed)
Mupirocin Ointment Rx [facility] [address] for positive PCR of staph. Pt notified and voiced understanding.

## 2015-02-25 NOTE — Progress Notes (Signed)
PCP is Jill Alexanders. Patient denied having any acute cardiac or pulmonary issues.

## 2015-02-26 LAB — HEMOGLOBIN A1C
Hgb A1c MFr Bld: 5.5 % (ref 4.8–5.6)
Mean Plasma Glucose: 111 mg/dL

## 2015-02-26 MED ORDER — PLASMA-LYTE 148 IV SOLN
INTRAVENOUS | Status: DC
  Filled 2015-02-26: qty 2.5

## 2015-02-26 MED ORDER — VANCOMYCIN HCL 10 G IV SOLR
1500.0000 mg | INTRAVENOUS | Status: AC
  Administered 2015-02-27: 1500 mg via INTRAVENOUS
  Filled 2015-02-26: qty 1500

## 2015-02-26 MED ORDER — SODIUM CHLORIDE 0.9 % IV SOLN
INTRAVENOUS | Status: AC
  Administered 2015-02-27: 1 [IU]/h via INTRAVENOUS
  Filled 2015-02-26: qty 2.5

## 2015-02-26 MED ORDER — GLUTARALDEHYDE 0.625% SOAKING SOLUTION
TOPICAL | Status: DC
  Filled 2015-02-26: qty 50

## 2015-02-26 MED ORDER — SODIUM CHLORIDE 0.9 % IV SOLN
INTRAVENOUS | Status: DC
  Filled 2015-02-26: qty 30

## 2015-02-26 MED ORDER — PHENYLEPHRINE HCL 10 MG/ML IJ SOLN
30.0000 ug/min | INTRAMUSCULAR | Status: DC
  Filled 2015-02-26: qty 2

## 2015-02-26 MED ORDER — DEXMEDETOMIDINE HCL IN NACL 400 MCG/100ML IV SOLN
0.1000 ug/kg/h | INTRAVENOUS | Status: AC
  Administered 2015-02-27: .3 ug/kg/h via INTRAVENOUS
  Filled 2015-02-26: qty 100

## 2015-02-26 MED ORDER — VANCOMYCIN HCL 1000 MG IV SOLR
INTRAVENOUS | Status: AC
  Administered 2015-02-27: 1000 mL
  Filled 2015-02-26: qty 1000

## 2015-02-26 MED ORDER — METOPROLOL TARTRATE 12.5 MG HALF TABLET: 12.5000 mg | ORAL_TABLET | ORAL | Status: DC

## 2015-02-26 MED ORDER — DEXTROSE 5 % IV SOLN
750.0000 mg | INTRAVENOUS | Status: DC
  Filled 2015-02-26: qty 750

## 2015-02-26 MED ORDER — DEXTROSE 5 % IV SOLN
1.5000 g | INTRAVENOUS | Status: AC
  Administered 2015-02-27: 1500 mg via INTRAVENOUS
  Administered 2015-02-27: 750 mg via INTRAVENOUS
  Filled 2015-02-26 (×2): qty 1.5

## 2015-02-26 MED ORDER — EPINEPHRINE HCL 1 MG/ML IJ SOLN
0.0000 ug/min | INTRAVENOUS | Status: DC
  Filled 2015-02-26: qty 4

## 2015-02-26 MED ORDER — SODIUM CHLORIDE 0.9 % IV SOLN
INTRAVENOUS | Status: AC
  Administered 2015-02-27: 69.8 mL/h via INTRAVENOUS
  Filled 2015-02-26: qty 40

## 2015-02-26 MED ORDER — MAGNESIUM SULFATE 50 % IJ SOLN
40.0000 meq | INTRAMUSCULAR | Status: DC
  Filled 2015-02-26: qty 10

## 2015-02-26 MED ORDER — DOPAMINE-DEXTROSE 3.2-5 MG/ML-% IV SOLN
0.0000 ug/kg/min | INTRAVENOUS | Status: DC
  Filled 2015-02-26: qty 250

## 2015-02-26 MED ORDER — NITROGLYCERIN IN D5W 200-5 MCG/ML-% IV SOLN
2.0000 ug/min | INTRAVENOUS | Status: AC
  Administered 2015-02-27: 5 ug/min via INTRAVENOUS
  Filled 2015-02-26: qty 250

## 2015-02-26 MED ORDER — POTASSIUM CHLORIDE 2 MEQ/ML IV SOLN
80.0000 meq | INTRAVENOUS | Status: DC
  Filled 2015-02-26: qty 40

## 2015-02-26 NOTE — H&P (Signed)
[address]       [place] [address]             [phone]          CARDIOTHORACIC SURGERY HISTORY AND PHYSICAL EXAM  Referring Provider is Josue Hector, MD PCP is Wyatt Haste, MD  Chief Complaint  Patient presents with  . Mitral Regurgitation    Surgical eval, TEE 10/03/14, 2D Echo 09/12/14    HPI:  Patient is a 69 year old male with long-standing history of mitral valve prolapse and heart murmur who has recently been found to have severe mitral regurgitation and has been referred for surgical consultation to consider possible elective mitral valve repair. The patient states that he has been told that he had mitral valve prolapse and a heart murmur for much of his adult life. He apparently was evaluated more than 12 years ago at the [facility] heart and vascular center. He was released from their care without follow-up. On recent follow-up examination with the patient's primary care physician the patient was noted to have a prominent systolic murmur on physical exam. The patient also expressed concerns regarding his underlying family history with known strong family history of coronary artery disease and a sister who recently suffered a stroke related to atrial fibrillation. He subsequently underwent transthoracic echocardiogram 09/12/2014 that revealed bileaflet prolapse of the mitral valve with possibly severe mitral regurgitation. The patient was referred for formal cardiology consultation with Dr. Johnsie Cancel, and he subsequently underwent transesophageal echocardiogram 10/03/2014 that confirmed the presence of mitral valve prolapse with severe mitral regurgitation. There was normal left ventricular size and systolic function with ejection fraction estimated 60-65%. The patient has been referred for elective surgical consultation.  The patient is married and lives locally with his wife in [place]. 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 This requires some travel in the region but no strenuous physical activity. The patient lives a somewhat sedentary lifestyle. He denies any associated symptoms of exertional shortness of breath or fatigue. He has never had any chest pain or chest tightness with activity. He sometimes feels as though his heart is pounding, particularly when he is lying in bed sleeping at night. He denies any history of PND, orthopnea, or lower extremity edema. He has occasional palpitations. He denies any history of dizzy spells or syncope.  Patient returns to the office today for follow-up of stage C severe asymptomatic primary mitral regurgitation. He was originally seen in consultation on 10/29/2014. Since then he underwent diagnostic cardiac catheterization by Dr. Johnsie Cancel on 02/08/2015. This revealed the presence of normal coronary artery anatomy with no significant coronary artery disease. There was severe mitral regurgitation with normal left ventricular systolic function. Right heart catheterization was not performed. The patient returns to the office today to discuss the results of this procedure and make final plans for elective mitral valve repair. He reports no new problems or complaints over the last 3 months. He specifically continues to deny any associated symptoms of exertional shortness of breath or chest discomfort.         Past Medical History  Diagnosis Date  . Sensory - neural hearing loss   . Vasomotor rhinitis   . Allergic rhinitis   . MVP (mitral valve prolapse)   . BPH (benign prostatic hyperplasia)   . Hyperlipidemia   . Severe mitral regurgitation 10/05/2014  . Heart murmur   . Acne rosacea     Past Surgical  History  Procedure Laterality Date  . Colonoscopy  2007    Dr. Henrene Pastor  . Hand surgery  2001    right  . Wart removal  1997    cryo surgery (penis)  . Wisdom tooth extraction  1994  . Septoplasty  1998  . Undescended testicle  1951  . Tee without  cardioversion N/A 10/03/2014    Procedure: TRANSESOPHAGEAL ECHOCARDIOGRAM (TEE);  Surgeon: Josue Hector, MD;  Location: [facility] ENDOSCOPY;  Service: Cardiovascular;  Laterality: N/A;  . Cardiac catheterization N/A 02/08/2015    Procedure: Left Heart Cath And Coronary Angiography;  Surgeon: Josue Hector, MD;  Location: [facility] LAB;  Service: Cardiovascular;  Laterality: N/A;    Family History  Problem Relation Age of Onset  . Colon cancer Paternal Uncle 23  . Stomach cancer Neg Hx   . Colon cancer Paternal Uncle 83  . Stroke Sister   . Heart failure Mother   . Heart attack Father     Social History History  Substance Use Topics  . Smoking status: Never Smoker   . Smokeless tobacco: Never Used  . Alcohol Use: Yes     Comment: occasional    Prior to Admission medications   Medication Sig Start Date End Date Taking? Authorizing Provider  amiodarone (PACERONE) 200 MG tablet Take 1 tablet (200 mg total) by mouth 2 (two) times daily with a meal. 02/11/15  Yes Rexene Alberts, MD  amoxicillin (AMOXIL) 500 MG capsule Take 2,000 mg by mouth See admin instructions. Take 4 capsules (2000 mg) one hour prior to appointment   Yes Historical Provider, MD  aspirin EC 81 MG tablet Take 81 mg by mouth daily before supper.   Yes Historical Provider, MD  atorvastatin (LIPITOR) 10 MG tablet Take 10 mg by mouth daily before supper.   Yes Historical Provider, MD  Eyelid Cleansers (OCUSOFT LID SCRUB EX) Apply topically every morning.   Yes Historical Provider, MD  finasteride (PROSCAR) 5 MG tablet Take 5 mg by mouth daily before supper.   Yes Historical Provider, MD  GLUCOSAMINE-CHONDROITIN PO Take 1 tablet by mouth daily. Glucosamine 1500 mg/ chrondroiton 1200 mg   Yes Historical Provider, MD  ketotifen (ZADITOR) 0.025 % ophthalmic solution Apply 1 drop to eye daily as needed (Itchy Eyes).   Yes Historical Provider, MD  Multiple Vitamins-Minerals (MULTIVITAMIN WITH MINERALS) tablet Take 1 tablet by  mouth daily.   Yes Historical Provider, MD  naproxen sodium (ALEVE) 220 MG tablet Take 220-440 mg by mouth 2 (two) times daily as needed (pain).   Yes Historical Provider, MD  Omega-3 Fatty Acids (FISH OIL) 1200 MG CAPS Take 1 capsule by mouth at bedtime.   Yes Historical Provider, MD    No Known Allergies    Review of Systems:  General:normal appetite, normal energy, no weight gain, no weight loss, no fever Cardiac:no chest pain with exertion, no chest pain at rest, no SOB with exertion, no resting SOB, no PND, no orthopnea, + palpitations, no arrhythmia, no atrial fibrillation, no LE edema, no dizzy spells, no syncope Respiratory:no shortness of breath, no home oxygen, no productive cough, no dry cough, no bronchitis, no wheezing, no hemoptysis, no asthma, no pain with inspiration or cough, no sleep apnea, no CPAP at night GI:no difficulty swallowing, no reflux, occasional frequent heartburn, no hiatal hernia, no abdominal pain, no constipation, no diarrhea, no hematochezia, no hematemesis, no melena GU:no dysuria, no frequency, no urinary tract infection, no hematuria, + enlarged prostate, +  nocturia, no kidney stones, no kidney disease Vascular:no pain suggestive of claudication, no pain in feet, occasional leg cramps, no varicose veins, no DVT, no non-healing foot ulcer Neuro:no stroke, no TIA's, no seizures, no headaches, no temporary blindness one eye, no slurred speech, no peripheral neuropathy, no chronic pain, no instability of gait, no memory/cognitive dysfunction Musculoskeletal:no arthritis, no joint swelling, no myalgias, no difficulty walking, normal mobility  Skin:no  rash, no itching, no skin infections, no pressure sores or ulcerations Psych:no anxiety, no depression, no nervousness, no unusual recent stress Eyes:no blurry vision, + floaters, no recent vision changes, + wears glasses or contacts ENT:no hearing loss, no loose or painful teeth, no dentures, last saw dentist January 2016 - gets antibiotic prophylaxis Hematologic:no easy bruising, no abnormal bleeding, no clotting disorder, no frequent epistaxis Endocrine:no diabetes, does not check CBG's at home   Physical Exam:  BP 103/67 mmHg  Pulse 61  Resp 20  Ht 6\' 3"  (1.905 m)  Wt 202 lb (91.627 kg)  BMI 25.25 kg/m2  SpO2 98% General: well-appearing HEENT:Unremarkable  Neck:no JVD, no bruits, no adenopathy  Chest:clear to auscultation, symmetrical breath sounds, no wheezes, no rhonchi  CV:RRR, grade III/VI systolic murmur  Abdomen:soft, non-tender, no masses  Extremities:warm, well-perfused, pulses palpable, no LE edema Rectal/GUDeferred Neuro:Grossly non-focal and symmetrical throughout Skin:Clean and dry, no rashes, no breakdown  Diagnostic Tests:  Transthoracic Echocardiography  Patient:  Andrew Ayala, Andrew Ayala MR #:    [id number] Study Date: 09/12/2014 Gender:   M Age:    83 Height:   190.5 cm Weight:   90.7 kg BSA:    2.2 m^2 Pt. Status: Room:  SONOGRAPHER Victorio Palm, RDCS ATTENDING  Herb Grays REFERRING  Denita Lung PERFORMING  [facility] Outpatient  cc:  ------------------------------------------------------------------- LV EF: 55% -  60%  ------------------------------------------------------------------- Indications:   Mitral valve prolapse (I34.1).  ------------------------------------------------------------------- History:  PMH: Acquired from the patient and from the patient&'s chart. No prior cardiac history. Mitral valve prolapse. Risk factors: Dyslipidemia.  ------------------------------------------------------------------- Study Conclusions  - Left ventricle: The cavity size was normal. Wall thickness was normal. Systolic function was normal. The estimated ejection fraction was in the range of 55% to 60%. - Mitral valve: Bileaflet prolapse worse in the posterior leaflet with more anteriorly directed MR. Degree of MR varies from parasternal to apical views but overall does appear severe Consider TEE to further evaluate since valve would be repairable - Left atrium: The atrium was mildly dilated. - Atrial septum: No defect or patent foramen ovale was identified.  Transthoracic echocardiography. M-mode, complete 2D, spectral Doppler, and color Doppler. Birthdate: Patient birthdate: March 06, 1946. Age: Patient is 69 yr old. Sex: Gender: male. BMI: 25 kg/m^2. Blood pressure:   118/78 Patient status: Outpatient. Study date: Study date: 09/12/2014. Study time: 04:20 PM. Location: Catheterization laboratory.  -------------------------------------------------------------------  ------------------------------------------------------------------- Left ventricle: The cavity size was normal. Wall thickness was normal. Systolic function was normal. The estimated ejection fraction was in the range of 55% to 60%.  ------------------------------------------------------------------- Aortic valve:   Mildly thickened leaflets. Doppler:  There was no stenosis.  ------------------------------------------------------------------- Aorta: The aorta was normal, not dilated, and non-diseased.  ------------------------------------------------------------------- Mitral valve: Bileaflet prolapse worse in the posterior leaflet with more anteriorly directed MR. Degree of MR varies from parasternal to apical views but overall does appear severe Consider TEE to further evaluate since valve would be repairable Doppler: Peak gradient (D): 3 mm Hg.  ------------------------------------------------------------------- Left atrium: The atrium was mildly dilated.  -------------------------------------------------------------------  Atrial septum: No defect or patent foramen ovale was identified.  ------------------------------------------------------------------- Right ventricle: The cavity size was normal. Wall thickness was normal. Systolic function was normal.  ------------------------------------------------------------------- Pulmonic valve:  Structurally normal valve.  Cusp separation was normal. Doppler: Transvalvular velocity was within the normal range. There was mild regurgitation.  ------------------------------------------------------------------- Tricuspid valve:  Structurally normal valve.  Leaflet separation was normal. Doppler: Transvalvular velocity was within the normal range. There was mild regurgitation.  ------------------------------------------------------------------- Right atrium: The atrium was normal in size.  ------------------------------------------------------------------- Pericardium: The pericardium was normal in appearance.  ------------------------------------------------------------------- Post procedure conclusions Ascending Aorta:  - The aorta was normal, not dilated, and  non-diseased.  ------------------------------------------------------------------- Measurements  Left ventricle             Value    Reference LV ID, ED, PLAX chordal        45  mm   43 - 52 LV ID, ES, PLAX chordal        31  mm   23 - 38 LV fx shortening, PLAX chordal     31  %   >=29 LV PW thickness, ED          11  mm   --------- IVS/LV PW ratio, ED          0.82     <=1.3 Stroke volume, 2D           91  ml   --------- Stroke volume/bsa, 2D         41  ml/m^2 --------- LV e&', lateral             10.2 cm/s  --------- LV E/e&', lateral            8.27     --------- LV e&', medial             6.09 cm/s  --------- LV E/e&', medial            13.86    --------- LV e&', average             8.15 cm/s  --------- LV E/e&', average            10.36    ---------  Ventricular septum           Value    Reference IVS thickness, ED           9   mm   ---------  LVOT                  Value    Reference LVOT ID, S               24  mm   --------- LVOT area               4.52 cm^2  --------- LVOT ID                24  mm   --------- LVOT mean velocity, S         70.8 cm/s  --------- LVOT VTI, S              20.1 cm   --------- Stroke volume (SV), LVOT DP      90.9 ml   --------- Stroke index (SV/bsa), LVOT DP     41.4 ml/m^2 ---------  Aorta                 Value    Reference Aortic root ID, ED  38  mm   --------- Ascending aorta ID, A-P, S       36  mm   ---------  Left atrium              Value    Reference LA ID, A-P, ES             37  mm    --------- LA ID/bsa, A-P             1.69 cm/m^2 <=2.2 LA volume, S              41.8 ml   --------- LA volume/bsa, S            19  ml/m^2 --------- LA volume, ES, 1-p A4C         30.4 ml   --------- LA volume/bsa, ES, 1-p A4C       13.8 ml/m^2 --------- LA volume, ES, 1-p A2C         51.1 ml   --------- LA volume/bsa, ES, 1-p A2C       23.3 ml/m^2 ---------  Mitral valve              Value    Reference Mitral E-wave peak velocity      84.4 cm/s  --------- Mitral deceleration time        229  ms   150 - 230 Mitral peak gradient, D        3   mm Hg --------- Mitral E/A ratio, peak         1.1     ---------  Right ventricle            Value    Reference RV s&', lateral, S           12.3 cm/s  ---------  Legend: (L) and (H) mark values outside specified reference range.  ------------------------------------------------------------------- Prepared and Electronically Authenticated by  Jenkins Rouge, M.D. 2015-12-23T17:24:40    Transesophageal Echocardiography  Patient:  Andrew Ayala, Andrew Ayala MR #:    [id number] Study Date: 10/03/2014 Gender:   M Age:    72 Height:   190.5 cm Weight:   90.9 kg BSA:    2.2 m^2 Pt. Status: Room:  ADMITTING  Jenkins Rouge, M.D. ATTENDING  Jenkins Rouge, M.D. ORDERING   Jenkins Rouge, M.D. PERFORMING  Jenkins Rouge, M.D. REFERRING  Jenkins Rouge, M.D. SONOGRAPHER Donata Clay  cc:  ------------------------------------------------------------------- LV EF: 60% -  65%  ------------------------------------------------------------------- Indications:   Mitral valve prolapse 424.0.  ------------------------------------------------------------------- History:  Risk factors:  Dyslipidemia.  ------------------------------------------------------------------- Study Conclusions  - Left ventricle: Systolic function was normal. The estimated ejection fraction was in the range of 60% to 65%. - Aortic valve: No evidence of vegetation. - Mitral valve: Myxomatous valve Severe prolapse P2 segment with severe MR PISA 1.3 Regurgitant volume 53 cc - Left atrium: The atrium was dilated. No evidence of thrombus in the atrial cavity or appendage. - Right atrium: No evidence of thrombus in the atrial cavity or appendage. - Atrial septum: No defect or patent foramen ovale was identified. Echo contrast study showed no right-to-left atrial level shunt, following an increase in RA pressure induced by provocative maneuvers. - Tricuspid valve: A prosthesis was present and functioning normally. The prosthesis had a normal range of motion. The sewing ring appeared normal, had no rocking motion, and showed no evidence of dehiscence. - Pulmonic valve: No evidence of vegetation.  Diagnostic transesophageal echocardiography. 2D and color Doppler. Birthdate: Patient birthdate: Aug 23, 1946.  Age: Patient is 69 yr old. Sex: Gender: male.  BMI: 25.1 kg/m^2. Blood pressure: 130/76 Patient status: Outpatient. Study date: Study date: 10/03/2014. Study time: 01:53 PM. Location: Endoscopy.  -------------------------------------------------------------------  ------------------------------------------------------------------- Left ventricle: Systolic function was normal. The estimated ejection fraction was in the range of 60% to 65%.  ------------------------------------------------------------------- Aortic valve:  Structurally normal valve.  Cusp separation was normal. No evidence of vegetation. Doppler: There was no regurgitation.  ------------------------------------------------------------------- Aorta: The aorta was normal, not dilated,  and non-diseased.  ------------------------------------------------------------------- Mitral valve: Myxomatous valve Severe prolapse P2 segment with severe MR PISA 1.3 Regurgitant volume 53 cc  ------------------------------------------------------------------- Left atrium: The atrium was dilated. No evidence of thrombus in the atrial cavity or appendage.  ------------------------------------------------------------------- Atrial septum: No defect or patent foramen ovale was identified. Echo contrast study showed no right-to-left atrial level shunt, following an increase in RA pressure induced by provocative maneuvers.  ------------------------------------------------------------------- Right ventricle: The cavity size was normal. Wall thickness was normal. Systolic function was normal.  ------------------------------------------------------------------- Pulmonic valve:  Structurally normal valve.  Cusp separation was normal. No evidence of vegetation.  ------------------------------------------------------------------- Tricuspid valve: A prosthesis was present and functioning normally. The prosthesis had a normal range of motion. The sewing ring appeared normal, had no rocking motion, and showed no evidence of dehiscence. Doppler: There was mild regurgitation.  ------------------------------------------------------------------- Right atrium: The atrium was normal in size. No evidence of thrombus in the atrial cavity or appendage.  ------------------------------------------------------------------- Pericardium: The pericardium was normal in appearance. There was no pericardial effusion.  ------------------------------------------------------------------- Post procedure conclusions Ascending Aorta:  - The aorta was normal, not dilated, and non-diseased.  ------------------------------------------------------------------- Measurements  Mitral valve                Value Mitral maximal regurg velocity, PISA   539  cm/s Mitral regurg VTI, PISA          88.9 cm Mitral ERO, PISA             0.53 cm^2 Mitral regurg volume, PISA        47  ml  Legend: (L) and (H) mark values outside specified reference range.  ------------------------------------------------------------------- Prepared and Electronically Authenticated by  Jenkins Rouge, M.D. 2016-01-13T16:45:43            CARDIAC CATHETERIZATION  Conclusion    Normal right dominant coronary arteries Severe MR due to prolapse Has f/u with Dr Roxy Manns for MV repair    Impression:  Patient has mitral valve prolapse with stage C severe asymptomatic primary mitral regurgitation. Left ventricular size and systolic function remains preserved. The patient has typical myxomatous disease with what may be a small flail segment involving the posterior leaflet. Alternatives include continued medical therapy with close follow-up versus proceeding with elective mitral valve repair in the near future. There are no other complicating features and I feel there is a greater than 95% likelihood his valve should be repairable with expectations for very low operative risk and excellent long-term result. diagnostic cardiac catheterization is notable for the absence of significant coronary artery disease. The patient appears to be a good candidate for minimally invasive approach for surgery.  Plan:  The patient and his wife were counseled at length regarding the indications, risks and potential benefits of mitral valve repair. The rationale for elective surgery has been explained, including a comparison between surgery and continued medical therapy with close follow-up. The likelihood of successful and durable valve repair has been discussed with particular reference to the findings of their recent  echocardiogram. Based upon these findings and  previous experience, I have quoted them a greater than 95 percent likelihood of successful valve repair. In the unlikely event that their valve cannot be successfully repaired, we discussed the possibility of replacing the mitral valve using a mechanical prosthesis with the attendant need for long-term anticoagulation versus the alternative of replacing it using a bioprosthetic tissue valve with its potential for late structural valve deterioration and failure, depending upon the patient's longevity. The patient specifically requests that if the mitral valve must be replaced that it be done using a mechanical valve. The patient understands and accepts all potential risks of surgery including but not limited to risk of death, stroke or other neurologic complication, myocardial infarction, congestive heart failure, respiratory failure, renal failure, bleeding requiring transfusion and/or reexploration, arrhythmia, infection or other wound complications, pneumonia, pleural and/or pericardial effusion, pulmonary embolus, aortic dissection or other major vascular complication, or delayed complications related to valve repair or replacement including but not limited to structural valve deterioration and failure, thrombosis, embolization, endocarditis, or paravalvular leak. Alternative surgical approaches have been discussed including a comparison between conventional sternotomy and minimally-invasive techniques. The relative risks and benefits of each have been reviewed as they pertain to the patient's specific circumstances, and all of their questions have been addressed. Specific risks potentially related to the minimally-invasive approach were discussed at length, including but not limited to risk of conversion to full or partial sternotomy, aortic dissection or other major vascular complication, unilateral acute lung injury or pulmonary edema, phrenic nerve dysfunction or paralysis, rib fracture, chronic pain,  lung hernia, or lymphocele. All of their questions have been answered. We plan to proceed with surgery on Wednesday, 02/27/2015. The patient has been given a prescription for amiodarone to begin 7 days prior to surgery to decrease his risk of perioperative atrial dysrhythmias.    Valentina Gu. Roxy Manns, MD 02/11/2015 1:01 PM

## 2015-02-27 ENCOUNTER — Inpatient Hospital Stay (HOSPITAL_COMMUNITY): Payer: BLUE CROSS/BLUE SHIELD | Admitting: Certified Registered Nurse Anesthetist

## 2015-02-27 ENCOUNTER — Encounter (HOSPITAL_COMMUNITY)
Admission: RE | Disposition: A | Discharge status: Home / Self Care | Payer: BLUE CROSS/BLUE SHIELD | Source: Ambulatory Visit | Attending: Thoracic Surgery (Cardiothoracic Vascular Surgery)

## 2015-02-27 ENCOUNTER — Inpatient Hospital Stay (HOSPITAL_COMMUNITY): Payer: BLUE CROSS/BLUE SHIELD

## 2015-02-27 ENCOUNTER — Encounter (HOSPITAL_COMMUNITY): Payer: Self-pay | Admitting: Certified Registered Nurse Anesthetist

## 2015-02-27 ENCOUNTER — Inpatient Hospital Stay (HOSPITAL_COMMUNITY)
Admission: RE | Admit: 2015-02-27 | Discharge: 2015-03-04 | DRG: 219 | Disposition: A | Discharge status: Home / Self Care | Payer: BLUE CROSS/BLUE SHIELD | Source: Ambulatory Visit | Attending: Thoracic Surgery (Cardiothoracic Vascular Surgery) | Admitting: Thoracic Surgery (Cardiothoracic Vascular Surgery)

## 2015-02-27 ENCOUNTER — Ambulatory Visit (HOSPITAL_COMMUNITY): Payer: BLUE CROSS/BLUE SHIELD

## 2015-02-27 DIAGNOSIS — K5900 Constipation, unspecified: Secondary | ICD-10-CM | POA: Diagnosis not present

## 2015-02-27 DIAGNOSIS — N400 Enlarged prostate without lower urinary tract symptoms: Secondary | ICD-10-CM | POA: Diagnosis present

## 2015-02-27 DIAGNOSIS — Z7901 Long term (current) use of anticoagulants: Secondary | ICD-10-CM

## 2015-02-27 DIAGNOSIS — T502X5A Adverse effect of carbonic-anhydrase inhibitors, benzothiadiazides and other diuretics, initial encounter: Secondary | ICD-10-CM | POA: Diagnosis present

## 2015-02-27 DIAGNOSIS — Z8249 Family history of ischemic heart disease and other diseases of the circulatory system: Secondary | ICD-10-CM | POA: Diagnosis not present

## 2015-02-27 DIAGNOSIS — Z823 Family history of stroke: Secondary | ICD-10-CM

## 2015-02-27 DIAGNOSIS — H905 Unspecified sensorineural hearing loss: Secondary | ICD-10-CM | POA: Diagnosis present

## 2015-02-27 DIAGNOSIS — Z79899 Other long term (current) drug therapy: Secondary | ICD-10-CM | POA: Diagnosis not present

## 2015-02-27 DIAGNOSIS — I10 Essential (primary) hypertension: Secondary | ICD-10-CM | POA: Diagnosis present

## 2015-02-27 DIAGNOSIS — E876 Hypokalemia: Secondary | ICD-10-CM | POA: Diagnosis not present

## 2015-02-27 DIAGNOSIS — Z7982 Long term (current) use of aspirin: Secondary | ICD-10-CM | POA: Diagnosis not present

## 2015-02-27 DIAGNOSIS — E8779 Other fluid overload: Secondary | ICD-10-CM | POA: Diagnosis not present

## 2015-02-27 DIAGNOSIS — J9811 Atelectasis: Secondary | ICD-10-CM | POA: Diagnosis not present

## 2015-02-27 DIAGNOSIS — I341 Nonrheumatic mitral (valve) prolapse: Secondary | ICD-10-CM | POA: Diagnosis present

## 2015-02-27 DIAGNOSIS — D6959 Other secondary thrombocytopenia: Secondary | ICD-10-CM | POA: Diagnosis present

## 2015-02-27 DIAGNOSIS — Z9889 Other specified postprocedural states: Secondary | ICD-10-CM

## 2015-02-27 DIAGNOSIS — E785 Hyperlipidemia, unspecified: Secondary | ICD-10-CM | POA: Diagnosis present

## 2015-02-27 DIAGNOSIS — I340 Nonrheumatic mitral (valve) insufficiency: Secondary | ICD-10-CM | POA: Diagnosis not present

## 2015-02-27 DIAGNOSIS — D62 Acute posthemorrhagic anemia: Secondary | ICD-10-CM | POA: Diagnosis not present

## 2015-02-27 DIAGNOSIS — J90 Pleural effusion, not elsewhere classified: Secondary | ICD-10-CM

## 2015-02-27 DIAGNOSIS — I511 Rupture of chordae tendineae, not elsewhere classified: Secondary | ICD-10-CM | POA: Diagnosis present

## 2015-02-27 HISTORY — DX: Other specified postprocedural states: Z98.890

## 2015-02-27 HISTORY — PX: MITRAL VALVE REPAIR: SHX2039

## 2015-02-27 HISTORY — PX: TEE WITHOUT CARDIOVERSION: SHX5443

## 2015-02-27 LAB — POCT I-STAT, CHEM 8
BUN: 16 mg/dL (ref 6–20)
BUN: 15 mg/dL (ref 6–20)
BUN: 13 mg/dL (ref 6–20)
BUN: 13 mg/dL (ref 6–20)
BUN: 13 mg/dL (ref 6–20)
BUN: 12 mg/dL (ref 6–20)
BUN: 12 mg/dL (ref 6–20)
CHLORIDE: 102 mmol/L (ref 101–111)
CREATININE: 0.8 mg/dL (ref 0.61–1.24)
CREATININE: 0.8 mg/dL (ref 0.61–1.24)
Calcium, Ion: 1.24 mmol/L (ref 1.13–1.30)
Calcium, Ion: 1.22 mmol/L (ref 1.13–1.30)
Calcium, Ion: 1.09 mmol/L — ABNORMAL LOW (ref 1.13–1.30)
Calcium, Ion: 1.14 mmol/L (ref 1.13–1.30)
Calcium, Ion: 1.14 mmol/L (ref 1.13–1.30)
Calcium, Ion: 1.08 mmol/L — ABNORMAL LOW (ref 1.13–1.30)
Calcium, Ion: 1.14 mmol/L (ref 1.13–1.30)
Chloride: 103 mmol/L (ref 101–111)
Chloride: 104 mmol/L (ref 101–111)
Chloride: 103 mmol/L (ref 101–111)
Chloride: 105 mmol/L (ref 101–111)
Chloride: 104 mmol/L (ref 101–111)
Chloride: 106 mmol/L (ref 101–111)
Creatinine, Ser: 0.8 mg/dL (ref 0.61–1.24)
Creatinine, Ser: 0.8 mg/dL (ref 0.61–1.24)
Creatinine, Ser: 0.8 mg/dL (ref 0.61–1.24)
Creatinine, Ser: 0.7 mg/dL (ref 0.61–1.24)
Creatinine, Ser: 0.9 mg/dL (ref 0.61–1.24)
GLUCOSE: 113 mg/dL — AB (ref 65–99)
GLUCOSE: 138 mg/dL — AB (ref 65–99)
GLUCOSE: 133 mg/dL — AB (ref 65–99)
Glucose, Bld: 104 mg/dL — ABNORMAL HIGH (ref 65–99)
Glucose, Bld: 114 mg/dL — ABNORMAL HIGH (ref 65–99)
Glucose, Bld: 131 mg/dL — ABNORMAL HIGH (ref 65–99)
Glucose, Bld: 122 mg/dL — ABNORMAL HIGH (ref 65–99)
HCT: 37 % — ABNORMAL LOW (ref 39.0–52.0)
HCT: 35 % — ABNORMAL LOW (ref 39.0–52.0)
HCT: 28 % — ABNORMAL LOW (ref 39.0–52.0)
HCT: 26 % — ABNORMAL LOW (ref 39.0–52.0)
HCT: 30 % — ABNORMAL LOW (ref 39.0–52.0)
HEMATOCRIT: 30 % — AB (ref 39.0–52.0)
HEMATOCRIT: 28 % — AB (ref 39.0–52.0)
HEMOGLOBIN: 12.6 g/dL — AB (ref 13.0–17.0)
HEMOGLOBIN: 9.5 g/dL — AB (ref 13.0–17.0)
Hemoglobin: 11.9 g/dL — ABNORMAL LOW (ref 13.0–17.0)
Hemoglobin: 10.2 g/dL — ABNORMAL LOW (ref 13.0–17.0)
Hemoglobin: 9.5 g/dL — ABNORMAL LOW (ref 13.0–17.0)
Hemoglobin: 8.8 g/dL — ABNORMAL LOW (ref 13.0–17.0)
Hemoglobin: 10.2 g/dL — ABNORMAL LOW (ref 13.0–17.0)
POTASSIUM: 4.3 mmol/L (ref 3.5–5.1)
POTASSIUM: 4 mmol/L (ref 3.5–5.1)
Potassium: 4.2 mmol/L (ref 3.5–5.1)
Potassium: 4.7 mmol/L (ref 3.5–5.1)
Potassium: 4.6 mmol/L (ref 3.5–5.1)
Potassium: 3.6 mmol/L (ref 3.5–5.1)
Potassium: 4.5 mmol/L (ref 3.5–5.1)
SODIUM: 139 mmol/L (ref 135–145)
SODIUM: 141 mmol/L (ref 135–145)
SODIUM: 137 mmol/L (ref 135–145)
SODIUM: 138 mmol/L (ref 135–145)
SODIUM: 140 mmol/L (ref 135–145)
Sodium: 140 mmol/L (ref 135–145)
Sodium: 138 mmol/L (ref 135–145)
TCO2: 26 mmol/L (ref 0–100)
TCO2: 25 mmol/L (ref 0–100)
TCO2: 23 mmol/L (ref 0–100)
TCO2: 24 mmol/L (ref 0–100)
TCO2: 22 mmol/L (ref 0–100)
TCO2: 20 mmol/L (ref 0–100)
TCO2: 19 mmol/L (ref 0–100)

## 2015-02-27 LAB — POCT I-STAT 3, ART BLOOD GAS (G3+)
ACID-BASE DEFICIT: 3 mmol/L — AB (ref 0.0–2.0)
ACID-BASE DEFICIT: 4 mmol/L — AB (ref 0.0–2.0)
Acid-Base Excess: 1 mmol/L (ref 0.0–2.0)
Acid-base deficit: 3 mmol/L — ABNORMAL HIGH (ref 0.0–2.0)
BICARBONATE: 21.4 meq/L (ref 20.0–24.0)
Bicarbonate: 26.7 mEq/L — ABNORMAL HIGH (ref 20.0–24.0)
Bicarbonate: 22.6 mEq/L (ref 20.0–24.0)
Bicarbonate: 24.8 mEq/L — ABNORMAL HIGH (ref 20.0–24.0)
O2 SAT: 92 %
O2 Saturation: 100 %
O2 Saturation: 97 %
O2 Saturation: 97 %
PCO2 ART: 41.1 mmHg (ref 35.0–45.0)
PH ART: 7.348 — AB (ref 7.350–7.450)
PH ART: 7.269 — AB (ref 7.350–7.450)
PO2 ART: 69 mmHg — AB (ref 80.0–100.0)
PO2 ART: 92 mmHg (ref 80.0–100.0)
Patient temperature: 35.4
Patient temperature: 36.7
TCO2: 28 mmol/L (ref 0–100)
TCO2: 24 mmol/L (ref 0–100)
TCO2: 26 mmol/L (ref 0–100)
TCO2: 23 mmol/L (ref 0–100)
pCO2 arterial: 47 mmHg — ABNORMAL HIGH (ref 35.0–45.0)
pCO2 arterial: 53.1 mmHg — ABNORMAL HIGH (ref 35.0–45.0)
pCO2 arterial: 41 mmHg (ref 35.0–45.0)
pH, Arterial: 7.362 (ref 7.350–7.450)
pH, Arterial: 7.325 — ABNORMAL LOW (ref 7.350–7.450)
pO2, Arterial: 353 mmHg — ABNORMAL HIGH (ref 80.0–100.0)
pO2, Arterial: 94 mmHg (ref 80.0–100.0)

## 2015-02-27 LAB — POCT I-STAT 4, (NA,K, GLUC, HGB,HCT)
GLUCOSE: 118 mg/dL — AB (ref 65–99)
HEMATOCRIT: 35 % — AB (ref 39.0–52.0)
Hemoglobin: 11.9 g/dL — ABNORMAL LOW (ref 13.0–17.0)
Potassium: 3.9 mmol/L (ref 3.5–5.1)
Sodium: 141 mmol/L (ref 135–145)

## 2015-02-27 LAB — GLUCOSE, CAPILLARY
GLUCOSE-CAPILLARY: 106 mg/dL — AB (ref 65–99)
GLUCOSE-CAPILLARY: 98 mg/dL (ref 65–99)
GLUCOSE-CAPILLARY: 98 mg/dL (ref 65–99)
Glucose-Capillary: 109 mg/dL — ABNORMAL HIGH (ref 65–99)

## 2015-02-27 LAB — HEMOGLOBIN AND HEMATOCRIT, BLOOD
HCT: 28.3 % — ABNORMAL LOW (ref 39.0–52.0)
HEMOGLOBIN: 10.2 g/dL — AB (ref 13.0–17.0)

## 2015-02-27 LAB — CBC
HEMATOCRIT: 35.3 % — AB (ref 39.0–52.0)
HEMATOCRIT: 31.7 % — AB (ref 39.0–52.0)
Hemoglobin: 12.4 g/dL — ABNORMAL LOW (ref 13.0–17.0)
Hemoglobin: 11.2 g/dL — ABNORMAL LOW (ref 13.0–17.0)
MCH: 33.1 pg (ref 26.0–34.0)
MCH: 32.8 pg (ref 26.0–34.0)
MCHC: 35.1 g/dL (ref 30.0–36.0)
MCHC: 35.3 g/dL (ref 30.0–36.0)
MCV: 94.1 fL (ref 78.0–100.0)
MCV: 93 fL (ref 78.0–100.0)
Platelets: 113 10*3/uL — ABNORMAL LOW (ref 150–400)
Platelets: 83 10*3/uL — ABNORMAL LOW (ref 150–400)
RBC: 3.75 MIL/uL — ABNORMAL LOW (ref 4.22–5.81)
RBC: 3.41 MIL/uL — ABNORMAL LOW (ref 4.22–5.81)
RDW: 12.1 % (ref 11.5–15.5)
RDW: 12 % (ref 11.5–15.5)
WBC: 13.7 10*3/uL — AB (ref 4.0–10.5)
WBC: 8.5 10*3/uL (ref 4.0–10.5)

## 2015-02-27 LAB — CREATININE, SERUM
CREATININE: 0.95 mg/dL (ref 0.61–1.24)
GFR calc non Af Amer: >60 mL/min (ref >60)

## 2015-02-27 LAB — PROTIME-INR
INR: 1.43 (ref 0.00–1.49)
Prothrombin Time: 17.6 seconds — ABNORMAL HIGH (ref 11.6–15.2)

## 2015-02-27 LAB — APTT: APTT: 34 s (ref 24–37)

## 2015-02-27 LAB — MAGNESIUM: Magnesium: 2.7 mg/dL — ABNORMAL HIGH (ref 1.7–2.4)

## 2015-02-27 LAB — PLATELET COUNT: PLATELETS: 88 10*3/uL — AB (ref 150–400)

## 2015-02-27 SURGERY — REPAIR, MITRAL VALVE, MINIMALLY INVASIVE
Anesthesia: General | Site: Chest | Laterality: Right

## 2015-02-27 MED ORDER — PROTAMINE SULFATE 10 MG/ML IV SOLN
INTRAVENOUS | Status: AC
  Filled 2015-02-27: qty 25

## 2015-02-27 MED ORDER — PROPOFOL 10 MG/ML IV BOLUS
INTRAVENOUS | Status: AC
  Filled 2015-02-27: qty 20

## 2015-02-27 MED ORDER — GLYCOPYRROLATE 0.2 MG/ML IJ SOLN
INTRAMUSCULAR | Status: DC | PRN
  Administered 2015-02-27: 0.2 mg via INTRAVENOUS

## 2015-02-27 MED ORDER — LACTATED RINGERS IV SOLN
INTRAVENOUS | Status: DC | PRN
  Administered 2015-02-27: 09:00:00 via INTRAVENOUS

## 2015-02-27 MED ORDER — CHLORHEXIDINE GLUCONATE 4 % EX LIQD: 30.0000 mL | CUTANEOUS | Status: DC

## 2015-02-27 MED ORDER — SODIUM CHLORIDE 0.45 % IV SOLN: INTRAVENOUS | Status: DC | PRN

## 2015-02-27 MED ORDER — MIDAZOLAM HCL 2 MG/2ML IJ SOLN: 2.0000 mg | INTRAMUSCULAR | Status: DC | PRN

## 2015-02-27 MED ORDER — PHENYLEPHRINE 40 MCG/ML (10ML) SYRINGE FOR IV PUSH (FOR BLOOD PRESSURE SUPPORT)
PREFILLED_SYRINGE | INTRAVENOUS | Status: AC
  Filled 2015-02-27: qty 10

## 2015-02-27 MED ORDER — ROCURONIUM BROMIDE 50 MG/5ML IV SOLN
INTRAVENOUS | Status: AC
  Filled 2015-02-27: qty 1

## 2015-02-27 MED ORDER — CETYLPYRIDINIUM CHLORIDE 0.05 % MT LIQD
7.0000 mL | Freq: Four times a day (QID) | OROMUCOSAL | Status: DC
  Administered 2015-02-28 (×2): 7 mL via OROMUCOSAL

## 2015-02-27 MED ORDER — ASPIRIN EC 325 MG PO TBEC
325.0000 mg | DELAYED_RELEASE_TABLET | Freq: Every day | ORAL | Status: DC
  Administered 2015-02-28: 325 mg via ORAL
  Filled 2015-02-27 (×2): qty 1

## 2015-02-27 MED ORDER — LACTATED RINGERS IV SOLN
INTRAVENOUS | Status: DC | PRN
  Administered 2015-02-27: 08:00:00 via INTRAVENOUS

## 2015-02-27 MED ORDER — BISACODYL 5 MG PO TBEC
10.0000 mg | DELAYED_RELEASE_TABLET | Freq: Every day | ORAL | Status: DC
  Administered 2015-02-28 – 2015-03-04 (×3): 10 mg via ORAL
  Filled 2015-02-27 (×3): qty 2

## 2015-02-27 MED ORDER — ROCURONIUM BROMIDE 50 MG/5ML IV SOLN
INTRAVENOUS | Status: AC
  Filled 2015-02-27: qty 4

## 2015-02-27 MED ORDER — SODIUM CHLORIDE 0.9 % IV SOLN
INTRAVENOUS | Status: DC
  Administered 2015-02-27: 0.9 [IU]/h via INTRAVENOUS
  Administered 2015-02-27: 1.2 [IU]/h via INTRAVENOUS
  Filled 2015-02-27: qty 2.5

## 2015-02-27 MED ORDER — METOPROLOL TARTRATE 1 MG/ML IV SOLN: 2.5000 mg | INTRAVENOUS | Status: DC | PRN

## 2015-02-27 MED ORDER — ROCURONIUM BROMIDE 100 MG/10ML IV SOLN
INTRAVENOUS | Status: DC | PRN
  Administered 2015-02-27 (×6): 50 mg via INTRAVENOUS

## 2015-02-27 MED ORDER — ASPIRIN 81 MG PO CHEW: 324.0000 mg | CHEWABLE_TABLET | Freq: Every day | ORAL | Status: DC

## 2015-02-27 MED ORDER — ARTIFICIAL TEARS OP OINT
TOPICAL_OINTMENT | OPHTHALMIC | Status: AC
  Filled 2015-02-27: qty 3.5

## 2015-02-27 MED ORDER — ALBUMIN HUMAN 5 % IV SOLN
250.0000 mL | INTRAVENOUS | Status: AC | PRN
  Administered 2015-02-27 (×4): 250 mL via INTRAVENOUS
  Filled 2015-02-27 (×2): qty 250

## 2015-02-27 MED ORDER — MIDAZOLAM HCL 5 MG/5ML IJ SOLN
INTRAMUSCULAR | Status: DC | PRN
  Administered 2015-02-27: 2 mg via INTRAVENOUS
  Administered 2015-02-27: 1 mg via INTRAVENOUS
  Administered 2015-02-27: 2 mg via INTRAVENOUS
  Administered 2015-02-27: 5 mg via INTRAVENOUS
  Administered 2015-02-27 (×2): 1 mg via INTRAVENOUS
  Administered 2015-02-27: 3 mg via INTRAVENOUS

## 2015-02-27 MED ORDER — SODIUM CHLORIDE 0.9 % IV SOLN
INTRAVENOUS | Status: DC | PRN
  Administered 2015-02-27: 14:00:00 via INTRAVENOUS

## 2015-02-27 MED ORDER — MORPHINE SULFATE 2 MG/ML IJ SOLN
1.0000 mg | INTRAMUSCULAR | Status: DC | PRN
  Administered 2015-02-28: 2 mg via INTRAVENOUS

## 2015-02-27 MED ORDER — METOPROLOL TARTRATE 12.5 MG HALF TABLET
12.5000 mg | ORAL_TABLET | Freq: Two times a day (BID) | ORAL | Status: DC
  Administered 2015-03-01 – 2015-03-04 (×7): 12.5 mg via ORAL
  Filled 2015-02-27 (×11): qty 1

## 2015-02-27 MED ORDER — HEPARIN SODIUM (PORCINE) 1000 UNIT/ML IJ SOLN
INTRAMUSCULAR | Status: DC | PRN
  Administered 2015-02-27: 32000 [IU] via INTRAVENOUS

## 2015-02-27 MED ORDER — MIDAZOLAM HCL 10 MG/2ML IJ SOLN
INTRAMUSCULAR | Status: AC
  Filled 2015-02-27: qty 2

## 2015-02-27 MED ORDER — LACTATED RINGERS IV SOLN
INTRAVENOUS | Status: DC
  Administered 2015-02-27: 15:00:00 via INTRAVENOUS

## 2015-02-27 MED ORDER — ACETAMINOPHEN 160 MG/5ML PO SOLN: 650.0000 mg | Freq: Once | ORAL | Status: AC

## 2015-02-27 MED ORDER — SODIUM CHLORIDE 0.9 % IV SOLN: 250.0000 mL | INTRAVENOUS | Status: DC

## 2015-02-27 MED ORDER — DOCUSATE SODIUM 100 MG PO CAPS
200.0000 mg | ORAL_CAPSULE | Freq: Every day | ORAL | Status: DC
  Administered 2015-02-28 – 2015-03-04 (×4): 200 mg via ORAL
  Filled 2015-02-27 (×5): qty 2

## 2015-02-27 MED ORDER — 0.9 % SODIUM CHLORIDE (POUR BTL) OPTIME
TOPICAL | Status: DC | PRN
  Administered 2015-02-27: 5000 mL

## 2015-02-27 MED ORDER — EPHEDRINE SULFATE 50 MG/ML IJ SOLN
INTRAMUSCULAR | Status: AC
  Filled 2015-02-27: qty 1

## 2015-02-27 MED ORDER — SODIUM CHLORIDE 0.9 % IR SOLN
Status: DC | PRN
  Administered 2015-02-27: 3000 mL
  Administered 2015-02-27: 1000 mL

## 2015-02-27 MED ORDER — INSULIN ASPART 100 UNIT/ML ~~LOC~~ SOLN
0.0000 [IU] | SUBCUTANEOUS | Status: DC
  Administered 2015-02-28: 4 [IU] via SUBCUTANEOUS

## 2015-02-27 MED ORDER — OXYCODONE HCL 5 MG PO TABS
5.0000 mg | ORAL_TABLET | ORAL | Status: DC | PRN
  Administered 2015-02-28: 10 mg via ORAL
  Administered 2015-03-02 – 2015-03-03 (×4): 5 mg via ORAL
  Administered 2015-03-04: 10 mg via ORAL
  Filled 2015-02-27: qty 2
  Filled 2015-02-27 (×2): qty 1
  Filled 2015-02-27: qty 2
  Filled 2015-02-27 (×2): qty 1

## 2015-02-27 MED ORDER — MORPHINE SULFATE 2 MG/ML IJ SOLN
2.0000 mg | INTRAMUSCULAR | Status: DC | PRN
  Administered 2015-02-28: 4 mg via INTRAVENOUS
  Administered 2015-02-28 (×3): 2 mg via INTRAVENOUS
  Filled 2015-02-27 (×2): qty 1
  Filled 2015-02-27: qty 2
  Filled 2015-02-27 (×2): qty 1

## 2015-02-27 MED ORDER — DEXMEDETOMIDINE HCL IN NACL 200 MCG/50ML IV SOLN
0.0000 ug/kg/h | INTRAVENOUS | Status: DC
  Administered 2015-02-27: 0.7 ug/kg/h via INTRAVENOUS
  Administered 2015-02-27: 0.3 ug/kg/h via INTRAVENOUS
  Filled 2015-02-27 (×2): qty 50

## 2015-02-27 MED ORDER — PHENYLEPHRINE HCL 10 MG/ML IJ SOLN
10.0000 mg | INTRAMUSCULAR | Status: DC | PRN
  Administered 2015-02-27: 20 ug/min via INTRAVENOUS

## 2015-02-27 MED ORDER — ALBUMIN HUMAN 5 % IV SOLN
INTRAVENOUS | Status: DC | PRN
  Administered 2015-02-27: 14:00:00 via INTRAVENOUS

## 2015-02-27 MED ORDER — FENTANYL CITRATE (PF) 250 MCG/5ML IJ SOLN
INTRAMUSCULAR | Status: AC
  Filled 2015-02-27: qty 5

## 2015-02-27 MED ORDER — PANTOPRAZOLE SODIUM 40 MG PO TBEC
40.0000 mg | DELAYED_RELEASE_TABLET | Freq: Every day | ORAL | Status: DC
  Administered 2015-03-01 – 2015-03-04 (×4): 40 mg via ORAL
  Filled 2015-02-27 (×3): qty 1

## 2015-02-27 MED ORDER — LACTATED RINGERS IV SOLN
INTRAVENOUS | Status: DC | PRN
  Administered 2015-02-27 (×2): via INTRAVENOUS

## 2015-02-27 MED ORDER — SODIUM CHLORIDE 0.9 % IJ SOLN
3.0000 mL | INTRAMUSCULAR | Status: DC | PRN
Start: 2015-02-28 — End: 2015-03-04

## 2015-02-27 MED ORDER — METOPROLOL TARTRATE 25 MG/10 ML ORAL SUSPENSION
12.5000 mg | Freq: Two times a day (BID) | ORAL | Status: DC
  Filled 2015-02-27 (×3): qty 5

## 2015-02-27 MED ORDER — ACETAMINOPHEN 160 MG/5ML PO SOLN: 1000.0000 mg | Freq: Four times a day (QID) | ORAL | Status: DC

## 2015-02-27 MED ORDER — CHLORHEXIDINE GLUCONATE 0.12 % MT SOLN
15.0000 mL | Freq: Two times a day (BID) | OROMUCOSAL | Status: DC
  Administered 2015-02-27: 15 mL via OROMUCOSAL
  Filled 2015-02-27: qty 15

## 2015-02-27 MED ORDER — GLYCOPYRROLATE 0.2 MG/ML IJ SOLN
INTRAMUSCULAR | Status: AC
  Filled 2015-02-27: qty 1

## 2015-02-27 MED ORDER — ACETAMINOPHEN 500 MG PO TABS
1000.0000 mg | ORAL_TABLET | Freq: Four times a day (QID) | ORAL | Status: DC
  Administered 2015-02-28 – 2015-03-04 (×14): 1000 mg via ORAL
  Filled 2015-02-27 (×19): qty 2

## 2015-02-27 MED ORDER — TRAMADOL HCL 50 MG PO TABS
50.0000 mg | ORAL_TABLET | ORAL | Status: DC | PRN
  Administered 2015-03-01 – 2015-03-02 (×3): 50 mg via ORAL
  Filled 2015-02-27 (×3): qty 1

## 2015-02-27 MED ORDER — ARTIFICIAL TEARS OP OINT
TOPICAL_OINTMENT | OPHTHALMIC | Status: DC | PRN
  Administered 2015-02-27: 1 via OPHTHALMIC

## 2015-02-27 MED ORDER — SUCCINYLCHOLINE CHLORIDE 20 MG/ML IJ SOLN
INTRAMUSCULAR | Status: AC
  Filled 2015-02-27: qty 1

## 2015-02-27 MED ORDER — FAMOTIDINE IN NACL 20-0.9 MG/50ML-% IV SOLN
20.0000 mg | Freq: Two times a day (BID) | INTRAVENOUS | Status: AC
  Administered 2015-02-27 (×2): 20 mg via INTRAVENOUS
  Filled 2015-02-27: qty 50

## 2015-02-27 MED ORDER — VANCOMYCIN HCL IN DEXTROSE 1-5 GM/200ML-% IV SOLN
1000.0000 mg | Freq: Once | INTRAVENOUS | Status: AC
  Administered 2015-02-27: 1000 mg via INTRAVENOUS
  Filled 2015-02-27: qty 200

## 2015-02-27 MED ORDER — SODIUM CHLORIDE 0.9 % IV SOLN
INTRAVENOUS | Status: DC
  Administered 2015-02-27: 15:00:00 via INTRAVENOUS

## 2015-02-27 MED ORDER — NITROGLYCERIN IN D5W 200-5 MCG/ML-% IV SOLN
0.0000 ug/min | INTRAVENOUS | Status: DC
  Administered 2015-02-27: 5 ug/min via INTRAVENOUS

## 2015-02-27 MED ORDER — DEXTROSE 5 % IV SOLN
20.0000 mg | INTRAVENOUS | Status: DC | PRN
  Administered 2015-02-27: 20 ug/min via INTRAVENOUS

## 2015-02-27 MED ORDER — LACTATED RINGERS IV SOLN: 500.0000 mL | Freq: Once | INTRAVENOUS | Status: AC | PRN

## 2015-02-27 MED ORDER — STERILE WATER FOR INJECTION IJ SOLN
INTRAMUSCULAR | Status: AC
  Filled 2015-02-27: qty 10

## 2015-02-27 MED ORDER — CEFUROXIME SODIUM 1.5 G IJ SOLR
1.5000 g | Freq: Two times a day (BID) | INTRAMUSCULAR | Status: AC
  Administered 2015-02-27 – 2015-03-01 (×4): 1.5 g via INTRAVENOUS
  Filled 2015-02-27 (×4): qty 1.5

## 2015-02-27 MED ORDER — FENTANYL CITRATE (PF) 100 MCG/2ML IJ SOLN
INTRAMUSCULAR | Status: DC | PRN
  Administered 2015-02-27: 150 ug via INTRAVENOUS
  Administered 2015-02-27: 50 ug via INTRAVENOUS
  Administered 2015-02-27: 250 ug via INTRAVENOUS
  Administered 2015-02-27: 50 ug via INTRAVENOUS
  Administered 2015-02-27: 150 ug via INTRAVENOUS
  Administered 2015-02-27: 100 ug via INTRAVENOUS
  Administered 2015-02-27 (×2): 150 ug via INTRAVENOUS
  Administered 2015-02-27 (×2): 100 ug via INTRAVENOUS

## 2015-02-27 MED ORDER — ACETAMINOPHEN 650 MG RE SUPP
650.0000 mg | Freq: Once | RECTAL | Status: AC
  Administered 2015-02-27: 650 mg via RECTAL

## 2015-02-27 MED ORDER — PROTAMINE SULFATE 10 MG/ML IV SOLN
INTRAVENOUS | Status: DC | PRN
  Administered 2015-02-27: 60 mg via INTRAVENOUS
  Administered 2015-02-27: 50 mg via INTRAVENOUS
  Administered 2015-02-27: 30 mg via INTRAVENOUS
  Administered 2015-02-27: 50 mg via INTRAVENOUS
  Administered 2015-02-27 (×2): 30 mg via INTRAVENOUS

## 2015-02-27 MED ORDER — BISACODYL 10 MG RE SUPP: 10.0000 mg | Freq: Every day | RECTAL | Status: DC

## 2015-02-27 MED ORDER — PROPOFOL 10 MG/ML IV BOLUS
INTRAVENOUS | Status: DC | PRN
  Administered 2015-02-27: 100 mg via INTRAVENOUS

## 2015-02-27 MED ORDER — INSULIN REGULAR BOLUS VIA INFUSION
0.0000 [IU] | Freq: Three times a day (TID) | INTRAVENOUS | Status: DC
  Filled 2015-02-27: qty 10

## 2015-02-27 MED ORDER — LIDOCAINE HCL (CARDIAC) 20 MG/ML IV SOLN
INTRAVENOUS | Status: AC
  Filled 2015-02-27: qty 5

## 2015-02-27 MED ORDER — POTASSIUM CHLORIDE 10 MEQ/50ML IV SOLN
10.0000 meq | INTRAVENOUS | Status: AC
  Administered 2015-02-27 (×3): 10 meq via INTRAVENOUS

## 2015-02-27 MED ORDER — HEPARIN SODIUM (PORCINE) 1000 UNIT/ML IJ SOLN
INTRAMUSCULAR | Status: AC
  Filled 2015-02-27: qty 1

## 2015-02-27 MED ORDER — ONDANSETRON HCL 4 MG/2ML IJ SOLN
4.0000 mg | Freq: Four times a day (QID) | INTRAMUSCULAR | Status: DC | PRN
  Administered 2015-02-28 – 2015-03-03 (×5): 4 mg via INTRAVENOUS
  Filled 2015-02-27 (×5): qty 2

## 2015-02-27 MED ORDER — MAGNESIUM SULFATE 4 GM/100ML IV SOLN
4.0000 g | Freq: Once | INTRAVENOUS | Status: AC
  Administered 2015-02-27: 4 g via INTRAVENOUS
  Filled 2015-02-27: qty 100

## 2015-02-27 MED ORDER — SODIUM CHLORIDE 0.9 % IJ SOLN
3.0000 mL | Freq: Two times a day (BID) | INTRAMUSCULAR | Status: DC
  Administered 2015-02-28 – 2015-03-01 (×2): 10 mL via INTRAVENOUS
  Administered 2015-03-01 – 2015-03-03 (×4): 3 mL via INTRAVENOUS

## 2015-02-27 MED ORDER — PHENYLEPHRINE HCL 10 MG/ML IJ SOLN
0.0000 ug/min | INTRAVENOUS | Status: DC
  Administered 2015-02-27: 10 ug/min via INTRAVENOUS
  Filled 2015-02-27: qty 2

## 2015-02-27 SURGICAL SUPPLY — 106 items
ADAPTER CARDIO PERF ANTE/RETRO (ADAPTER) ×3 IMPLANT
BAG DECANTER FOR FLEXI CONT (MISCELLANEOUS) ×3 IMPLANT
BLADE SURG 11 STRL SS (BLADE) ×3 IMPLANT
CANISTER SUCTION 2500CC (MISCELLANEOUS) ×3 IMPLANT
CANNULA FEM VENOUS REMOTE 22FR (CANNULA) ×3 IMPLANT
CANNULA FEMORAL ART 14 SM (MISCELLANEOUS) ×3 IMPLANT
CANNULA GUNDRY RCSP 15FR (MISCELLANEOUS) ×3 IMPLANT
CANNULA OPTISITE PERFUSION 16F (CANNULA) IMPLANT
CANNULA OPTISITE PERFUSION 18F (CANNULA) ×3 IMPLANT
CANNULA SUMP PERICARDIAL (CANNULA) ×6 IMPLANT
CATH KIT ON Q 5IN SLV (PAIN MANAGEMENT) IMPLANT
CELLS DAT CNTRL 66122 CELL SVR (MISCELLANEOUS) ×2 IMPLANT
CONN ST 1/4X3/8  BEN (MISCELLANEOUS) ×2
CONN ST 1/4X3/8 BEN (MISCELLANEOUS) ×4 IMPLANT
CONNECTOR 1/2X3/8X1/2 3 WAY (MISCELLANEOUS) ×1
CONNECTOR 1/2X3/8X1/2 3WAY (MISCELLANEOUS) ×2 IMPLANT
CONT SPEC 4OZ CLIKSEAL STRL BL (MISCELLANEOUS) ×3 IMPLANT
CONT SPEC STER OR (MISCELLANEOUS) IMPLANT
COVER BACK TABLE 24X17X13 BIG (DRAPES) ×3 IMPLANT
CRADLE DONUT ADULT HEAD (MISCELLANEOUS) ×3 IMPLANT
DERMABOND ADVANCED (GAUZE/BANDAGES/DRESSINGS) ×2
DERMABOND ADVANCED .7 DNX12 (GAUZE/BANDAGES/DRESSINGS) ×4 IMPLANT
DEVICE PMI PUNCTURE CLOSURE (MISCELLANEOUS) ×3 IMPLANT
DEVICE SUT CK QUICK LOAD MINI (Prosthesis & Implant Heart) ×6 IMPLANT
DEVICE TROCAR PUNCTURE CLOSURE (ENDOMECHANICALS) ×3 IMPLANT
DRAIN CHANNEL 28F RND 3/8 FF (WOUND CARE) ×6 IMPLANT
DRAPE BILATERAL SPLIT (DRAPES) ×3 IMPLANT
DRAPE C-ARM 42X72 X-RAY (DRAPES) ×3 IMPLANT
DRAPE CV SPLIT W-CLR ANES SCRN (DRAPES) ×3 IMPLANT
DRAPE INCISE IOBAN 66X45 STRL (DRAPES) ×9 IMPLANT
DRAPE PROXIMA HALF (DRAPES) ×3 IMPLANT
DRAPE SLUSH/WARMER DISC (DRAPES) ×3 IMPLANT
DRSG COVADERM 4X8 (GAUZE/BANDAGES/DRESSINGS) ×3 IMPLANT
ELECT BLADE 6.5 EXT (BLADE) ×3 IMPLANT
ELECT REM PT RETURN 9FT ADLT (ELECTROSURGICAL) ×6
ELECTRODE REM PT RTRN 9FT ADLT (ELECTROSURGICAL) ×4 IMPLANT
FEMORAL VENOUS CANN RAP (CANNULA) IMPLANT
FLUID NSS /IRRIG 3000 ML XXX (IV SOLUTION) ×3 IMPLANT
GAUZE SPONGE 4X4 12PLY STRL (GAUZE/BANDAGES/DRESSINGS) ×3 IMPLANT
GLOVE BIO SURGEON STRL SZ 6.5 (GLOVE) ×12 IMPLANT
GLOVE ORTHO TXT STRL SZ7.5 (GLOVE) ×9 IMPLANT
GOWN STRL REUS W/ TWL LRG LVL3 (GOWN DISPOSABLE) ×16 IMPLANT
GOWN STRL REUS W/TWL LRG LVL3 (GOWN DISPOSABLE) ×8
IV NS 1000ML (IV SOLUTION) ×1
IV NS 1000ML BAXH (IV SOLUTION) ×2 IMPLANT
KIT BASIN OR (CUSTOM PROCEDURE TRAY) ×3 IMPLANT
KIT DEVICE SUT COR-KNOT MIS 5 (INSTRUMENTS) ×3 IMPLANT
KIT DILATOR VASC 18G NDL (KITS) ×6 IMPLANT
KIT DRAINAGE VACCUM ASSIST (KITS) ×3 IMPLANT
KIT ROOM TURNOVER OR (KITS) ×3 IMPLANT
KIT SUCTION CATH 14FR (SUCTIONS) ×3 IMPLANT
KIT SUT CK MINI COMBO 4X17 (Prosthesis & Implant Heart) ×3 IMPLANT
LEAD PACING MYOCARDI (MISCELLANEOUS) ×3 IMPLANT
LINE VENT (MISCELLANEOUS) ×3 IMPLANT
NEEDLE AORTIC ROOT 14G 7F (CATHETERS) ×3 IMPLANT
NS IRRIG 1000ML POUR BTL (IV SOLUTION) ×15 IMPLANT
PACK OPEN HEART (CUSTOM PROCEDURE TRAY) ×3 IMPLANT
PACK TRANSFER 300ML (TOM) (MISCELLANEOUS) ×6 IMPLANT
PAD ARMBOARD 7.5X6 YLW CONV (MISCELLANEOUS) ×6 IMPLANT
PAD ELECT DEFIB RADIOL ZOLL (MISCELLANEOUS) ×3 IMPLANT
PATCH CORMATRIX 4CMX7CM (Prosthesis & Implant Heart) ×3 IMPLANT
RETRACTOR TRL SOFT TISSUE LG (INSTRUMENTS) IMPLANT
RETRACTOR TRM SOFT TISSUE 7.5 (INSTRUMENTS) IMPLANT
RING RECHORD MEMO 3D 34MM (Vascular Products) ×3 IMPLANT
RTRCTR WOUND ALEXIS 18CM MED (MISCELLANEOUS) ×3
SET CANNULATION TOURNIQUET (MISCELLANEOUS) ×3 IMPLANT
SET CARDIOPLEGIA MPS 5001102 (MISCELLANEOUS) ×3 IMPLANT
SET IRRIG TUBING LAPAROSCOPIC (IRRIGATION / IRRIGATOR) ×3 IMPLANT
SET Y EXTENSION LINE CSP (IV SETS) ×3 IMPLANT
SOLUTION ANTI FOG 6CC (MISCELLANEOUS) ×3 IMPLANT
SPONGE GAUZE 4X4 12PLY STER LF (GAUZE/BANDAGES/DRESSINGS) ×3 IMPLANT
SUT BONE WAX W31G (SUTURE) ×3 IMPLANT
SUT E-PACK MINIMALLY INVASIVE (SUTURE) ×3 IMPLANT
SUT ETHIBOND (SUTURE) ×6 IMPLANT
SUT ETHIBOND 2 0 SH (SUTURE) ×3 IMPLANT
SUT ETHIBOND 2-0 RB-1 WHT (SUTURE) ×6 IMPLANT
SUT ETHIBOND X763 2 0 SH 1 (SUTURE) ×3 IMPLANT
SUT GORETEX CV 4 TH 22 36 (SUTURE) ×3 IMPLANT
SUT GORETEX CV-5THC-13 36IN (SUTURE) ×21 IMPLANT
SUT GORETEX CV4 TH-18 (SUTURE) ×6 IMPLANT
SUT MNCRL AB 3-0 PS2 18 (SUTURE) ×3 IMPLANT
SUT PROLENE 3 0 SH1 36 (SUTURE) ×12 IMPLANT
SUT PTFE CHORD X SYSTEM (SUTURE) ×6 IMPLANT
SUT SILK 2 0 SH CR/8 (SUTURE) IMPLANT
SUT SILK 3 0 SH CR/8 (SUTURE) IMPLANT
SUT VIC AB 2-0 CTX 36 (SUTURE) IMPLANT
SUT VIC AB 3-0 SH 8-18 (SUTURE) IMPLANT
SUT VICRYL 2 TP 1 (SUTURE) IMPLANT
SYRINGE 10CC LL (SYRINGE) ×3 IMPLANT
SYRINGE TOOMEY DISP (SYRINGE) ×3 IMPLANT
SYSTEM SAHARA CHEST DRAIN ATS (WOUND CARE) ×3 IMPLANT
TAPE CLOTH SURG 4X10 WHT LF (GAUZE/BANDAGES/DRESSINGS) ×3 IMPLANT
TAPE PAPER 2X10 WHT MICROPORE (GAUZE/BANDAGES/DRESSINGS) ×3 IMPLANT
TOWEL OR 17X24 6PK STRL BLUE (TOWEL DISPOSABLE) IMPLANT
TOWEL OR 17X26 10 PK STRL BLUE (TOWEL DISPOSABLE) ×6 IMPLANT
TRANSDUCER COBE DISPOSABLE (MISCELLANEOUS) ×3 IMPLANT
TRAY FOLEY IC TEMP SENS 16FR (CATHETERS) ×3 IMPLANT
TROCAR XCEL BLADELESS 5X75MML (TROCAR) ×3 IMPLANT
TROCAR XCEL NON-BLD 11X100MML (ENDOMECHANICALS) ×6 IMPLANT
TUBE SUCT INTRACARD DLP 20F (MISCELLANEOUS) ×3 IMPLANT
TUBING PVC 1/4X1/16 WALL 8 (MISCELLANEOUS) ×6 IMPLANT
TUNNELER SHEATH ON-Q 11GX8 DSP (PAIN MANAGEMENT) IMPLANT
UNDERPAD 30X30 INCONTINENT (UNDERPADS AND DIAPERS) ×3 IMPLANT
WATER STERILE IRR 1000ML POUR (IV SOLUTION) ×6 IMPLANT
WIRE BENTSON .035X145CM (WIRE) ×3 IMPLANT
YANKAUER SUCT BULB TIP NO VENT (SUCTIONS) ×3 IMPLANT

## 2015-02-27 NOTE — Anesthesia Postprocedure Evaluation (Signed)
  Anesthesia Post-op Note  Patient: Andrew Ayala  Procedure(s) Performed: Procedure(s): MINIMALLY INVASIVE MITRAL VALVE REPAIR (MVR) (Right) TRANSESOPHAGEAL ECHOCARDIOGRAM (TEE) (N/A)  Patient Location: ICU  Anesthesia Type:General  Level of Consciousness: sedated  Airway and Oxygen Therapy: Patient remains intubated per anesthesia plan  Post-op Pain: none  Post-op Assessment: Post-op Vital signs reviewed, Patient's Cardiovascular Status Stable and Respiratory Function Stable              Post-op Vital Signs: Reviewed and stable  Last Vitals:  Filed Vitals:   02/27/15 0714  BP: 119/67  Pulse: 55  Temp: 36.1 C  Resp: 20    Complications: No apparent anesthesia complications

## 2015-02-27 NOTE — Progress Notes (Signed)
Rapid Wean terminated. Pt will follow simple commands. He will wiggle toes and squeeze hands. He will lift head and shoulders from bed but he is unable to do any of these for multiple seconds. ABG results are acceptable. CPAP trial through vent was successful . Pt is unable to preform NIF/VC exercises. When ask if he understands he shakes his head no. Pt seems to get very agitated during parameters. Pt seems very sleepy. When asked to open eyes he only does so for 2-3 seconds. RN at bedside and witnessed pt effort. Pt returned to full support. Will attempt again in 1 hour.

## 2015-02-27 NOTE — Transfer of Care (Signed)
Immediate Anesthesia Transfer of Care Note  Patient: Andrew Ayala  Procedure(s) Performed: Procedure(s): MINIMALLY INVASIVE MITRAL VALVE REPAIR (MVR) (Right) TRANSESOPHAGEAL ECHOCARDIOGRAM (TEE) (N/A)  Patient Location: SICU  Anesthesia Type:General  Level of Consciousness: sedated, unresponsive and Patient remains intubated per anesthesia plan  Airway & Oxygen Therapy: Patient remains intubated per anesthesia plan and Patient placed on Ventilator (see vital sign flow sheet for setting)  Post-op Assessment: Report given to RN and Post -op Vital signs reviewed and stable  Post vital signs: Reviewed and stable  Last Vitals:  Filed Vitals:   02/27/15 0714  BP: 119/67  Pulse: 55  Temp: 36.1 C  Resp: 20    Complications: No apparent anesthesia complications

## 2015-02-27 NOTE — Anesthesia Preprocedure Evaluation (Addendum)
Anesthesia Evaluation  Patient identified by MRN, date of birth, ID band Patient awake    Reviewed: Allergy & Precautions, NPO status , Patient's Chart, lab work & pertinent test results  Airway Mallampati: II  TM Distance: >3 FB Neck ROM: full    Dental  (+) Teeth Intact, Dental Advisory Given   Pulmonary neg pulmonary ROS,  breath sounds clear to auscultation        Cardiovascular negative cardio ROS  + Valvular Problems/Murmurs MR Rhythm:regular Rate:Normal     Neuro/Psych    GI/Hepatic   Endo/Other    Renal/GU      Musculoskeletal   Abdominal   Peds  Hematology   Anesthesia Other Findings   Reproductive/Obstetrics                            Anesthesia Physical Anesthesia Plan  ASA: III  Anesthesia Plan: General   Post-op Pain Management:    Induction: Intravenous  Airway Management Planned: Oral ETT and Double Lumen EBT  Additional Equipment: Arterial line, CVP, PA Cath, TEE, 3D TEE and Ultrasound Guidance Line Placement  Intra-op Plan:   Post-operative Plan: Post-operative intubation/ventilation  Informed Consent: I have reviewed the patients History and Physical, chart, labs and discussed the procedure including the risks, benefits and alternatives for the proposed anesthesia with the patient or authorized representative who has indicated his/her understanding and acceptance.     Plan Discussed with: CRNA, Anesthesiologist and Surgeon  Anesthesia Plan Comments:         Anesthesia Quick Evaluation

## 2015-02-27 NOTE — Interval H&P Note (Signed)
History and Physical Interval Note:  02/27/2015 7:03 AM  Andrew Ayala  has presented today for surgery, with the diagnosis of MR  The various methods of treatment have been discussed with the patient and family. After consideration of risks, benefits and other options for treatment, the patient has consented to  Procedure(s): MINIMALLY INVASIVE MITRAL VALVE REPAIR (MVR) (Right) TRANSESOPHAGEAL ECHOCARDIOGRAM (TEE) (N/A) as a surgical intervention .  The patient's history has been reviewed, patient examined, no change in status, stable for surgery.  I have reviewed the patient's chart and labs.  Questions were answered to the patient's satisfaction.     Rexene Alberts

## 2015-02-27 NOTE — Op Note (Signed)
CARDIOTHORACIC SURGERY OPERATIVE NOTE  Date of Procedure:  02/27/2015  Preoperative Diagnosis: Severe Mitral Regurgitation  Postoperative Diagnosis: Same  Procedure:    Minimally-Invasive Mitral Valve Repair  Complex valvuloplasty including triangular resection of posterior leaflet  Artificial Gore-tex neochord placement x8  Sorin Memo 3D Ring Annuloplasty (size 55mm, catalog # [id number], serial # [id number])    Surgeon: Valentina Gu. 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 The patient states that he has been told that he had mitral valve prolapse and a heart murmur for much of his adult life. He apparently was evaluated more than 12 years ago at the [facility]. He was released from their care without follow-up. On recent follow-up examination with the patient's primary care physician the patient was noted to have a prominent systolic murmur on physical exam. The patient also expressed concerns regarding his underlying family history with known strong family history of coronary artery disease and a sister who recently suffered a stroke related to atrial fibrillation. He subsequently underwent transthoracic echocardiogram 09/12/2014 that revealed bileaflet prolapse of the mitral valve with possibly  severe mitral regurgitation. The patient was referred for formal cardiology consultation with Dr. Johnsie Cancel, and he subsequently underwent transesophageal echocardiogram 10/03/2014 that confirmed the presence of mitral valve prolapse with severe mitral regurgitation. There was normal left ventricular size and systolic function with ejection fraction estimated 60-65%. The patient has been referred for elective surgical consultation.  The patient has been seen in consultation and counseled at length regarding the indications, risks and potential benefits of surgery.  All questions have been answered, and the patient provides full informed consent for the operation as described.    DETAILS OF THE OPERATIVE PROCEDURE  Preparation:  The patient is brought to the operating room on the above mentioned date and central monitoring was established by the anesthesia team including placement of Swan-Ganz catheter through the left internal jugular vein.  A radial arterial line is placed. The patient is placed in the supine position on the operating table.  Intravenous antibiotics are administered. General endotracheal anesthesia is induced uneventfully. The patient is initially intubated using a dual lumen endotracheal tube.  A Foley catheter is placed.  Baseline transesophageal echocardiogram was performed.  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 There was an eccentric jet of mitral regurgitation directed anteriorly around the entire left atrium. There was severe mitral regurgitation. There was normal left ventricular size and systolic function. The aortic valve is normal. The tricuspid valve was normal. No other abnormalities were noted.  A soft roll is placed behind the patient's left scapula and the neck gently extended and turned to the left.   The patient's right neck, chest, abdomen, both groins, and both lower  extremities are prepared and draped  in a sterile manner. A time out procedure is performed.  Surgical Approach:  A right miniature anterolateral thoracotomy incision is performed. The incision is placed just lateral to and superior to the right nipple. The pectoralis major muscle is retracted medially and completely preserved. The right pleural space is entered through the 4th intercostal space. A soft tissue retractor is placed.  Two 11 mm ports are placed through separate stab incisions inferiorly. The right pleural space is insufflated continuously with carbon dioxide gas through the posterior port during the remainder of the operation.  A pledgeted sutures placed through the dome of the right hemidiaphragm and retracted inferiorly to facilitate exposure.  A longitudinal incision is made in the pericardium 3 cm anterior to the phrenic nerve and silk traction sutures are placed on either side of the incision for exposure.   Extracorporeal Cardiopulmonary Bypass and Myocardial Protection:  A small incision is made in the right inguinal crease and the anterior surface of the right common femoral artery and right common femoral vein are identified.  The patient is placed in Trendelenburg position. The right internal jugular vein is cannulated with Seldinger technique and a guidewire advanced into the right atrium. The patient is heparinized systemically. The right internal jugular vein is cannulated with a 14 [place] pediatric femoral venous cannula. Pursestring sutures are placed on the anterior surface of the right common femoral vein and right common femoral artery. The right common femoral vein is cannulated with the Seldinger technique and a guidewire is advanced under transesophageal echocardiogram guidance through the right atrium. The femoral vein is cannulated with a long 22 French femoral venous cannula. The right common femoral artery is cannulated with Seldinger technique and a flexible guidewire  is advanced until it can be appreciated intraluminally in the descending thoracic aorta on transesophageal echocardiogram. The femoral artery is cannulated with an 18 French femoral arterial cannula.  Adequate heparinization is verified.     The entire pre-bypass portion of the operation was notable for stable hemodynamics.  Cardiopulmonary bypass was begun.  Vacuum assist venous drainage is utilized. The incision in the pericardium is extended in both directions. Venous drainage and exposure are notably excellent. A retrograde cardioplegia cannula is placed through the right atrium into the coronary sinus using transesophageal echocardiogram guidance.  An antegrade cardioplegia cannula is placed in the ascending aorta.    The patient is cooled to 28C systemic temperature.  The aortic cross clamp is applied and cold blood cardioplegia is delivered initially in an antegrade fashion through the aortic root.   Supplemental cardioplegia is given retrograde through the coronary sinus catheter. The initial cardioplegic arrest is rapid with early diastolic arrest.  Repeat doses of cardioplegia are administered intermittently every 20 to 30 minutes throughout the entire cross clamp portion of the operation through the aortic root and through the coronary sinus catheter in order to maintain completely flat electrocardiogram.  Myocardial protection was felt to be excellent.   Mitral Valve Repair:  A left atriotomy incision was performed through the interatrial groove and extended partially across the back wall of the left atrium after opening the oblique sinus inferiorly.  The mitral valve is exposed using a self-retaining retractor.  The mitral valve was inspected and notable for forme fruste variant of Barlow's type degenerative disease.  There was billowing excessive leaflet tissue involving much of the posterior leaflet. There were several ruptured primary chordae tendineae from the middle scallop (P2) of the  posterior leaflet associated with the portion of  P2 adjacent to the anterior papillary muscle. The anterior leaflet was relatively normal. There was mild to moderate thickening of the free margins of both leaflets. There was no annular calcification.  Interrupted 2-0 Ethibond horizontal mattress sutures are placed circumferentially around the entire mitral valve annulus. The sutures will ultimately be utilized for ring annuloplasty, and at this juncture there are utilized to suspend the valve symmetrically.  A Chord-X multi looped CV-4 Gore-Tex pledgeted suture is placed through the head of the anterior papillary muscle in a horizontal mattress fashion and tied.  A second Chord-X multi looped CV-4 Gore-Tex pledgeted suture is placed through the head of the posterior papillary muscle in a horizontal mattress fashion and tied.  The 6 pairs of Gore-Tex sutures from each of these will later be utilized for artificial Gore-Tex neo-cord placement.  The small flail segment of P2 is repaired using a simple triangular resection. Only approximately 10% of the total surface area of P2 was resected. The intervening vertical defect in P2 was closed using interrupted simple everting CV 5 Gore-Tex suture.  The valve was tested with saline and appeared competent even without ring annuloplasty complete. The valve was sized to a 34 mm annuloplasty ring, based upon the transverse distance between the left and right commissures and the height of the anterior leaflet, corresponding to a size just slightly larger than the overall surface area of the anterior leaflet.  A Sorin Memo 3D annuloplasty ring (size 43mm, catalog [id number], serial [id number]) was lowered into place uneventfully.   Each of the pairs of Gore-Tex cords were then inserted into the free margin of the posterior leaflet. A total of 4 artificial neochords using 2 pairs from the Zihlman suture placed in the anterior papillary muscle are placed into the free margin of  P2 where the triangular resection was performed.  A total of 4 artificial neochords using 2 pairs from the [place] suture placed in the posterior papillary muscle are placed into the free margin of P2 adjacent to the posterior papillary muscle. The remaining 2 artificial cords from each multi loop Chord-X suture were discarded such that a grand total of 8 artificial neo-cords were placed.  Each of these artificial cords were secured using the yellow loop from the Rechord annuloplasty ring to adjust the appropriate length.  All ring sutures were subsequently secured using a Cor-knot device.    The valve is tested with saline and appears to be perfectly competent with a broad symmetrical line of coaptation of the anterior and posterior leaflet. There is no residual leak. There was a broad, symmetrical line of coaptation of the anterior and posterior leaflet which was confirmed using the blue ink test.  Rewarming is begun.   Procedure Completion:  The atriotomy was closed using a 2-layer closure of running 3-0 Prolene suture after placing a sump drain across the mitral valve to serve as a left ventricular vent.  One final dose of warm retrograde "hot shot" cardioplegia was administered retrograde through the coronary sinus catheter while all air was evacuated through the aortic root.  The aortic cross clamp was removed after a total cross clamp time of 124 minutes.  Epicardial pacing wires are fixed to the inferior wall of the right ventricule and to the right atrial appendage. The patient is rewarmed to 37C temperature. The left ventricular vent is removed.  The patient is ventilated and flow volumes turndown while the mitral valve repair is inspected using transesophageal echocardiogram. The valve repair appears intact with no  residual leak. The antegrade cardioplegia cannula is now removed. The patient is weaned and disconnected from cardiopulmonary bypass.  The patient's rhythm at separation from bypass  was sinus.  The patient was weaned from bypass without any inotropic support. Total cardiopulmonary bypass time for the operation was 167 minutes.  Followup transesophageal echocardiogram performed after separation from bypass revealed a well-seated annuloplasty ring in the mitral position with a normal functioning mitral valve. There was no residual leak.  Left ventricular function was unchanged from preoperatively.    The femoral arterial and venous cannulae were removed uneventfully. There was a palpable pulse in the distal right common femoral artery after removal of the cannula. Protamine was administered to reverse the anticoagulation. The right internal jugular cannula was removed and manual pressure held on the neck for 15 minutes.  Single lung ventilation was begun. The atriotomy closure was inspected for hemostasis. The pericardial sac was drained using a 28 French Bard drain placed through the anterior port incision.  The pericardium was closed using a patch of core matrix bovine submucosal tissue patch. The right pleural space is irrigated with saline solution and inspected for hemostasis. The right pleural space was drained using a 28 French Bard drain placed through the posterior port incision. The miniature thoracotomy incision was closed in multiple layers in routine fashion. The right groin incision was inspected for hemostasis and closed in multiple layers in routine fashion.  The post-bypass portion of the operation was notable for stable rhythm and hemodynamics.  No blood products were administered during the operation.   Disposition:  The patient tolerated the procedure well.  The patient was reintubated using a single lumen endotracheal tube and subsequently transported to the surgical intensive care unit in stable condition. There were no intraoperative complications. All sponge instrument and needle counts are verified correct at completion of the operation.     Valentina Gu.  Roxy Manns MD 02/27/2015 2:47 PM

## 2015-02-27 NOTE — Anesthesia Procedure Notes (Addendum)
Procedure Name: Intubation Date/Time: 02/27/2015 8:47 AM Performed by: Garrison [place] T Pre-anesthesia Checklist: Patient identified, Emergency Drugs available, Suction available and Patient being monitored Patient Re-evaluated:Patient Re-evaluated prior to inductionOxygen Delivery Method: Circle system utilized Preoxygenation: Pre-oxygenation with 100% oxygen Intubation Type: IV induction Ventilation: Mask ventilation without difficulty and Oral airway inserted - appropriate to patient size Laryngoscope Size: Sabra Heck and 2 Grade View: Grade II Endobronchial tube: Double lumen EBT, EBT position confirmed by auscultation and EBT position confirmed by fiberoptic bronchoscope and 41 Fr Number of attempts: 1 Airway Equipment and Method: Stylet and Oral airway Placement Confirmation: ETT inserted through vocal cords under direct vision,  positive ETCO2 and breath sounds checked- equal and bilateral Tube secured with: Tape Dental Injury: Teeth and Oropharynx as per pre-operative assessment

## 2015-02-27 NOTE — Progress Notes (Signed)
Vent rate changed to 14bpm, lung recruitment maneuver performed x75mins pt tolerated well.

## 2015-02-27 NOTE — Progress Notes (Signed)
      [address]       [place] [address]             [phone]      S/p mitral repair  BP 120/84 mmHg  Pulse 70  Temp(Src) 97.3 F (36.3 C) (Core (Comment))  Resp 15  Ht 6' 3.5" (1.918 m)  Wt 201 lb (91.173 kg)  BMI 24.78 kg/m2  SpO2 99%   Intake/Output Summary (Last 24 hours) at 02/27/15 1848 Last data filed at 02/27/15 1800  Gross per 24 hour  Intake 5829.65 ml  Output   3905 ml  Net 1924.65 ml   Hct= 35 Off insulin drip  Doing well early postop  Remo Lipps C. Roxan Hockey, MD [facility] [phone]

## 2015-02-27 NOTE — OR Nursing (Signed)
1st call to SICU charge nurse

## 2015-02-27 NOTE — Progress Notes (Signed)
  Echocardiogram Echocardiogram Transesophageal has been performed.  Bobbye Charleston 02/27/2015, 9:23 AM

## 2015-02-27 NOTE — Progress Notes (Signed)
RT initiated Rapid Wean Protocol. Pt is following commands and doing well with wean. RR: 4, FIO2: 40%. RT will monitor. RN at bedside.

## 2015-02-27 NOTE — Procedures (Signed)
Extubation Procedure Note  Patient Details:   Name: Andrew Ayala DOB: 1946/06/03 MRN: [id number]   Airway Documentation:  Pre Extubation: Pt completed Rapid Wean Protocol without complication. Pt follows all commands. Cuff leak present. NIF -20, VC 1.1L. ABG within acceptable range. Pt extubated.  Post Extubation: Pt extubated to 2L [place] (Sats 98%) No Stridor. Pt able to cough/clear secretions. Pt able to speak name/location. IS: 772mls.   Evaluation  O2 sats: stable throughout Complications: No apparent complications Patient did tolerate procedure well. Bilateral Breath Sounds: Clear, Diminished   Yes  [name] 02/27/2015, 11:48 PM

## 2015-02-27 NOTE — Brief Op Note (Addendum)
02/27/2015      [address]       [place] [address]             [phone]   02/27/2015  1:00 PM  PATIENT:  Andrew Ayala  69 y.o. 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  MV Stenosis: No mitral valve stenosis.  MV Disease Functional Class: MV Disease Functional Class: Type II.  Etiology (Choose at least one and up to five): Degenerative. and Other. MYXOMATOUS  MV Lesions (Choose at least one): Leaflet prolapse, posterior.  Mitral/Tricuspid/Pulmonary Valve Procedure  Mitral Valve Procedure Performed:  Repair: Annuloplasty., Leaflet Resection. Resection Type:Triangular. Mitral Leaflet Resection Location: Posterior. and [place]ochro[place]. Number of Neochords Inserted: 8  Implant: Annuloplasty Device: Implant model number [id number], Size 34, Unique Device Identifier [id number].       COMPLICATIONS: None  BASELINE WEIGHT: 91 kg  PATIENT DISPOSITION:   TO SICU IN STABLE CONDITION  Rexene Alberts 02/27/2015 2:42 PM

## 2015-02-28 ENCOUNTER — Inpatient Hospital Stay (HOSPITAL_COMMUNITY): Payer: BLUE CROSS/BLUE SHIELD

## 2015-02-28 ENCOUNTER — Encounter (HOSPITAL_COMMUNITY): Payer: Self-pay | Admitting: Thoracic Surgery (Cardiothoracic Vascular Surgery)

## 2015-02-28 LAB — CBC
HCT: 30.1 % — ABNORMAL LOW (ref 39.0–52.0)
HCT: 31.4 % — ABNORMAL LOW (ref 39.0–52.0)
Hemoglobin: 10.6 g/dL — ABNORMAL LOW (ref 13.0–17.0)
Hemoglobin: 10.9 g/dL — ABNORMAL LOW (ref 13.0–17.0)
MCH: 33 pg (ref 26.0–34.0)
MCH: 33 pg (ref 26.0–34.0)
MCHC: 35.2 g/dL (ref 30.0–36.0)
MCHC: 34.7 g/dL (ref 30.0–36.0)
MCV: 93.8 fL (ref 78.0–100.0)
MCV: 95.2 fL (ref 78.0–100.0)
Platelets: 79 10*3/uL — ABNORMAL LOW (ref 150–400)
Platelets: 99 10*3/uL — ABNORMAL LOW (ref 150–400)
RBC: 3.21 MIL/uL — ABNORMAL LOW (ref 4.22–5.81)
RBC: 3.3 MIL/uL — ABNORMAL LOW (ref 4.22–5.81)
RDW: 12.2 % (ref 11.5–15.5)
RDW: 12.6 % (ref 11.5–15.5)
WBC: 10.3 10*3/uL (ref 4.0–10.5)
WBC: 19.8 10*3/uL — AB (ref 4.0–10.5)

## 2015-02-28 LAB — POCT I-STAT, CHEM 8
BUN: 17 mg/dL (ref 6–20)
CALCIUM ION: 1.13 mmol/L (ref 1.13–1.30)
CHLORIDE: 99 mmol/L — AB (ref 101–111)
Creatinine, Ser: 1 mg/dL (ref 0.61–1.24)
Glucose, Bld: 136 mg/dL — ABNORMAL HIGH (ref 65–99)
HCT: 33 % — ABNORMAL LOW (ref 39.0–52.0)
Hemoglobin: 11.2 g/dL — ABNORMAL LOW (ref 13.0–17.0)
Potassium: 3.9 mmol/L (ref 3.5–5.1)
Sodium: 137 mmol/L (ref 135–145)
TCO2: 22 mmol/L (ref 0–100)

## 2015-02-28 LAB — POCT I-STAT 3, ART BLOOD GAS (G3+)
ACID-BASE DEFICIT: 6 mmol/L — AB (ref 0.0–2.0)
ACID-BASE DEFICIT: 4 mmol/L — AB (ref 0.0–2.0)
Acid-base deficit: 4 mmol/L — ABNORMAL HIGH (ref 0.0–2.0)
BICARBONATE: 20.1 meq/L (ref 20.0–24.0)
BICARBONATE: 22.1 meq/L (ref 20.0–24.0)
Bicarbonate: 21.6 mEq/L (ref 20.0–24.0)
O2 Saturation: 96 %
O2 Saturation: 97 %
O2 Saturation: 98 %
PO2 ART: 95 mmHg (ref 80.0–100.0)
Patient temperature: 37
Patient temperature: 37
Patient temperature: 37.5
TCO2: 21 mmol/L (ref 0–100)
TCO2: 23 mmol/L (ref 0–100)
TCO2: 23 mmol/L (ref 0–100)
pCO2 arterial: 40.2 mmHg (ref 35.0–45.0)
pCO2 arterial: 40.6 mmHg (ref 35.0–45.0)
pCO2 arterial: 43.7 mmHg (ref 35.0–45.0)
pH, Arterial: 7.308 — ABNORMAL LOW (ref 7.350–7.450)
pH, Arterial: 7.315 — ABNORMAL LOW (ref 7.350–7.450)
pH, Arterial: 7.334 — ABNORMAL LOW (ref 7.350–7.450)
pO2, Arterial: 110 mmHg — ABNORMAL HIGH (ref 80.0–100.0)
pO2, Arterial: 91 mmHg (ref 80.0–100.0)

## 2015-02-28 LAB — BLOOD GAS, ARTERIAL
ACID-BASE DEFICIT: 3 mmol/L — AB (ref 0.0–2.0)
Bicarbonate: 21.9 mEq/L (ref 20.0–24.0)
DRAWN BY: 36496
FIO2: 0.28 %
O2 SAT: 96.1 %
PCO2 ART: 41.5 mmHg (ref 35.0–45.0)
Patient temperature: 98.6
TCO2: 23.1 mmol/L (ref 0–100)
pH, Arterial: 7.342 — ABNORMAL LOW (ref 7.350–7.450)
pO2, Arterial: 84.2 mmHg (ref 80.0–100.0)

## 2015-02-28 LAB — CREATININE, SERUM
Creatinine, Ser: 1.14 mg/dL (ref 0.61–1.24)
GFR calc Af Amer: >60 mL/min (ref >60)

## 2015-02-28 LAB — GLUCOSE, CAPILLARY
GLUCOSE-CAPILLARY: 118 mg/dL — AB (ref 65–99)
GLUCOSE-CAPILLARY: 151 mg/dL — AB (ref 65–99)
GLUCOSE-CAPILLARY: 94 mg/dL (ref 65–99)
Glucose-Capillary: 180 mg/dL — ABNORMAL HIGH (ref 65–99)
Glucose-Capillary: 133 mg/dL — ABNORMAL HIGH (ref 65–99)
Glucose-Capillary: 132 mg/dL — ABNORMAL HIGH (ref 65–99)
Glucose-Capillary: 132 mg/dL — ABNORMAL HIGH (ref 65–99)
Glucose-Capillary: 132 mg/dL — ABNORMAL HIGH (ref 65–99)

## 2015-02-28 LAB — BASIC METABOLIC PANEL
Anion gap: 7 (ref 5–15)
BUN: 12 mg/dL (ref 6–20)
CO2: 23 mmol/L (ref 22–32)
Calcium: 7.6 mg/dL — ABNORMAL LOW (ref 8.9–10.3)
Chloride: 107 mmol/L (ref 101–111)
Creatinine, Ser: 0.96 mg/dL (ref 0.61–1.24)
GFR calc non Af Amer: >60 mL/min (ref >60)
Glucose, Bld: 182 mg/dL — ABNORMAL HIGH (ref 65–99)
Potassium: 4.2 mmol/L (ref 3.5–5.1)
Sodium: 137 mmol/L (ref 135–145)

## 2015-02-28 LAB — MAGNESIUM
Magnesium: 2.2 mg/dL (ref 1.7–2.4)
Magnesium: 2.2 mg/dL (ref 1.7–2.4)

## 2015-02-28 MED ORDER — WARFARIN SODIUM 2.5 MG PO TABS
2.5000 mg | ORAL_TABLET | Freq: Every day | ORAL | Status: DC
  Administered 2015-02-28 – 2015-03-03 (×4): 2.5 mg via ORAL
  Filled 2015-02-28 (×5): qty 1

## 2015-02-28 MED ORDER — WARFARIN - PHYSICIAN DOSING INPATIENT
Freq: Every day | Status: DC
  Administered 2015-03-01: 18:00:00

## 2015-02-28 MED ORDER — INSULIN ASPART 100 UNIT/ML ~~LOC~~ SOLN
0.0000 [IU] | SUBCUTANEOUS | Status: DC
  Administered 2015-02-28 (×4): 2 [IU] via SUBCUTANEOUS

## 2015-02-28 MED ORDER — FUROSEMIDE 10 MG/ML IJ SOLN
20.0000 mg | Freq: Four times a day (QID) | INTRAMUSCULAR | Status: AC
  Administered 2015-02-28 (×3): 20 mg via INTRAVENOUS
  Filled 2015-02-28 (×3): qty 2

## 2015-02-28 MED ORDER — KETOROLAC TROMETHAMINE 15 MG/ML IJ SOLN
15.0000 mg | Freq: Four times a day (QID) | INTRAMUSCULAR | Status: AC
  Administered 2015-02-28 – 2015-03-01 (×5): 15 mg via INTRAVENOUS
  Filled 2015-02-28 (×5): qty 1

## 2015-02-28 MED ORDER — MORPHINE SULFATE 2 MG/ML IJ SOLN: 2.0000 mg | INTRAMUSCULAR | Status: DC | PRN

## 2015-02-28 MED FILL — Heparin Sodium (Porcine) Inj 1000 Unit/ML: INTRAMUSCULAR | Qty: 30 | Status: AC

## 2015-02-28 MED FILL — Potassium Chloride Inj 2 mEq/ML: INTRAVENOUS | Qty: 40 | Status: AC

## 2015-02-28 MED FILL — Magnesium Sulfate Inj 50%: INTRAMUSCULAR | Qty: 10 | Status: AC

## 2015-02-28 NOTE — Progress Notes (Signed)
[address]       [place] [address]             [phone]        CARDIOTHORACIC SURGERY PROGRESS NOTE   R1 Day Post-Op Procedure(s) (LRB): MINIMALLY INVASIVE MITRAL VALVE REPAIR (MVR) (Right) TRANSESOPHAGEAL ECHOCARDIOGRAM (TEE) (N/A)  Subjective: Looks good.  Feels sore in chest and back.  Breathing comfortably.  Objective: Vital signs: BP Readings from Last 1 Encounters:  02/28/15 111/59   Pulse Readings from Last 1 Encounters:  02/28/15 80   Resp Readings from Last 1 Encounters:  02/28/15 22   Temp Readings from Last 1 Encounters:  02/28/15 99.3 F (37.4 C)     Hemodynamics: PAP: (17-30)/(6-20) 22/14 mmHg CO:  [4 L/min-9.1 L/min] 9.1 L/min CI:  [1.8 L/min/m2-4.1 L/min/m2] 4.1 L/min/m2  Physical Exam:  Rhythm:   Sinus 50's - AAI paced  Breath sounds: clear  Heart sounds:  RRR w/out murmur  Incisions:  Dressings dry, intact  Abdomen:  Soft, non-distended, non-tender  Extremities:  Warm, well-perfused  Chest tubes:  Low volume thin serosanguinous output, no air leak   Intake/Output from previous day: 06/08 0701 - 06/09 0700 In: 8007.2 [I.V.:5508.2; Blood:589; NG/GT:60; IV Piggyback:1850] Out: 7846 [Urine:3840; Blood:1250; Chest Tube:500] Intake/Output this shift:    Lab Results:  CBC: Recent Labs  02/27/15 2040 02/27/15 2043 02/28/15 0350  WBC 8.5  --  10.3  HGB 11.2* 10.2* 10.6*  HCT 31.7* 30.0* 30.1*  PLT 83*  --  79*    BMET:  Recent Labs  02/25/15 1249  02/27/15 2043 02/28/15 0350  NA 137  < > 138 137  K 4.0  < > 4.5 4.2  CL 107  < > 106 107  CO2 22  --   --  23  GLUCOSE 94  < > 133* 182*  BUN 14  < > 12 12  CREATININE 0.95  < > 0.90 0.96  CALCIUM 9.0  --   --  7.6*  < > = values in this interval not displayed.   PT/INR:   Recent Labs  02/27/15 1539  LABPROT 17.6*  INR 1.43    CBG (last 3)   Recent Labs  02/27/15 1924 02/28/15 0012 02/28/15 0356  GLUCAP 109* 118* 180*    ABG      Component Value Date/Time   PHART 7.315* 02/28/2015 0407   PCO2ART 43.7 02/28/2015 0407   PO2ART 91.0 02/28/2015 0407   HCO3 22.1 02/28/2015 0407   TCO2 23 02/28/2015 0407   ACIDBASEDEF 4.0* 02/28/2015 0407   O2SAT 96.0 02/28/2015 0407    CXR: PORTABLE CHEST - 1 VIEW  COMPARISON: 02/27/2015 .  FINDINGS: Interim extubation and removal of NG tube. Right chest tube in stable position. Swan-Ganz catheter stable position. Mediastinum hilar structures are stable. Stable cardiomegaly with prior cardiac valve replacement. Mild bilateral interstitial prominence and small right pleural effusion. Mild congestive heart failure cannot be excluded. No pneumothorax. Mild gastric distention.  IMPRESSION: 1. Interim extubation removal of NG tube. Right chest tube and Swan-Ganz catheter stable position. 2. Stable cardiomegaly with prior cardiac valve replacement. Bilateral mild interstitial prominence and small right pleural effusion noted. Mild congestive heart failure cannot be excluded. 3. Mild gastric distention.   Electronically Signed  By: Marcello Moores Register  On: 02/28/2015 07:14  Assessment/Plan: S/P Procedure(s) (LRB): MINIMALLY INVASIVE MITRAL VALVE REPAIR (MVR) (Right) TRANSESOPHAGEAL ECHOCARDIOGRAM (TEE) (N/A)  Doing well POD1 Maintaining NSR w/ stable hemodynamics on IV NTG  for hypertension Expected post op acute blood loss anemia, mild Expected post op atelectasis, mild Expected post op volume excess, mild Post op thrombocytopenia, mild   Mobilize  Diuresis  D/C Swan-Ganz and Aline  Add Toradol for pain  Start coumadin slowly  Possible transfer to step down later today or tomorrow   Andrew Ayala 02/28/2015 7:08 AM

## 2015-02-28 NOTE — Progress Notes (Signed)
Patient ID: Andrew Ayala, male   DOB: 1946/07/15, 69 y.o.   MRN: [id number]  SICU Evening Rounds:  Hemodynamically stable A-paced 80  Urine output good CT output low.  CBC    Component Value Date/Time   WBC 19.8* 02/28/2015 1605   RBC 3.30* 02/28/2015 1605   HGB 10.9* 02/28/2015 1605   HCT 31.4* 02/28/2015 1605   PLT 99* 02/28/2015 1605   MCV 95.2 02/28/2015 1605   MCH 33.0 02/28/2015 1605   MCHC 34.7 02/28/2015 1605   RDW 12.6 02/28/2015 1605   LYMPHSABS 1.8 02/06/2015 1004   MONOABS 0.6 02/06/2015 1004   EOSABS 0.2 02/06/2015 1004   BASOSABS 0.0 02/06/2015 1004     BMET    Component Value Date/Time   NA 137 02/28/2015 1553   K 3.9 02/28/2015 1553   CL 99* 02/28/2015 1553   CO2 23 02/28/2015 0350   GLUCOSE 136* 02/28/2015 1553   BUN 17 02/28/2015 1553   CREATININE 1.14 02/28/2015 1605   CREATININE 0.80 09/10/2014 1535   CALCIUM 7.6* 02/28/2015 0350   GFRNONAA >60 02/28/2015 1605   GFRAA >60 02/28/2015 1605    A/P: stable day. Continue current plans.

## 2015-03-01 ENCOUNTER — Inpatient Hospital Stay (HOSPITAL_COMMUNITY): Payer: BLUE CROSS/BLUE SHIELD

## 2015-03-01 LAB — CBC
HCT: 29.3 % — ABNORMAL LOW (ref 39.0–52.0)
Hemoglobin: 10.2 g/dL — ABNORMAL LOW (ref 13.0–17.0)
MCH: 33.3 pg (ref 26.0–34.0)
MCHC: 34.8 g/dL (ref 30.0–36.0)
MCV: 95.8 fL (ref 78.0–100.0)
Platelets: 73 10*3/uL — ABNORMAL LOW (ref 150–400)
RBC: 3.06 MIL/uL — AB (ref 4.22–5.81)
RDW: 12.8 % (ref 11.5–15.5)
WBC: 13.1 10*3/uL — AB (ref 4.0–10.5)

## 2015-03-01 LAB — GLUCOSE, CAPILLARY
GLUCOSE-CAPILLARY: 97 mg/dL (ref 65–99)
Glucose-Capillary: 101 mg/dL — ABNORMAL HIGH (ref 65–99)

## 2015-03-01 LAB — BASIC METABOLIC PANEL
Anion gap: 7 (ref 5–15)
BUN: 14 mg/dL (ref 6–20)
CALCIUM: 8.3 mg/dL — AB (ref 8.9–10.3)
CO2: 29 mmol/L (ref 22–32)
Chloride: 102 mmol/L (ref 101–111)
Creatinine, Ser: 0.96 mg/dL (ref 0.61–1.24)
GFR calc non Af Amer: >60 mL/min (ref >60)
Glucose, Bld: 98 mg/dL (ref 65–99)
Potassium: 3.5 mmol/L (ref 3.5–5.1)
Sodium: 138 mmol/L (ref 135–145)

## 2015-03-01 LAB — PROTIME-INR
INR: 1.32 (ref 0.00–1.49)
Prothrombin Time: 16.5 seconds — ABNORMAL HIGH (ref 11.6–15.2)

## 2015-03-01 MED ORDER — KETOTIFEN FUMARATE 0.025 % OP SOLN: 1.0000 [drp] | Freq: Every day | OPHTHALMIC | Status: DC | PRN

## 2015-03-01 MED ORDER — SODIUM CHLORIDE 0.9 % IJ SOLN: 3.0000 mL | INTRAMUSCULAR | Status: DC | PRN

## 2015-03-01 MED ORDER — FINASTERIDE 5 MG PO TABS
5.0000 mg | ORAL_TABLET | Freq: Every day | ORAL | Status: DC
  Administered 2015-03-01 – 2015-03-03 (×3): 5 mg via ORAL
  Filled 2015-03-01 (×4): qty 1

## 2015-03-01 MED ORDER — SODIUM CHLORIDE 0.9 % IV SOLN: 250.0000 mL | INTRAVENOUS | Status: DC | PRN

## 2015-03-01 MED ORDER — FUROSEMIDE 40 MG PO TABS
40.0000 mg | ORAL_TABLET | Freq: Two times a day (BID) | ORAL | Status: AC
  Administered 2015-03-01 – 2015-03-03 (×6): 40 mg via ORAL
  Filled 2015-03-01 (×6): qty 1

## 2015-03-01 MED ORDER — ASPIRIN EC 81 MG PO TBEC
81.0000 mg | DELAYED_RELEASE_TABLET | Freq: Every day | ORAL | Status: DC
  Administered 2015-03-01 – 2015-03-03 (×3): 81 mg via ORAL
  Filled 2015-03-01 (×4): qty 1

## 2015-03-01 MED ORDER — MOVING RIGHT ALONG BOOK
Freq: Once | Status: AC
  Administered 2015-03-01: 08:00:00
  Filled 2015-03-01: qty 1

## 2015-03-01 MED ORDER — POTASSIUM CHLORIDE 10 MEQ/50ML IV SOLN
10.0000 meq | INTRAVENOUS | Status: DC | PRN
  Administered 2015-03-01 (×2): 10 meq via INTRAVENOUS
  Filled 2015-03-01 (×4): qty 50

## 2015-03-01 MED ORDER — SODIUM CHLORIDE 0.9 % IJ SOLN
3.0000 mL | Freq: Two times a day (BID) | INTRAMUSCULAR | Status: DC
  Administered 2015-03-01 – 2015-03-03 (×3): 3 mL via INTRAVENOUS

## 2015-03-01 MED ORDER — AMIODARONE HCL 200 MG PO TABS
200.0000 mg | ORAL_TABLET | Freq: Every day | ORAL | Status: DC
  Administered 2015-03-02 – 2015-03-04 (×4): 200 mg via ORAL
  Filled 2015-03-01 (×3): qty 1

## 2015-03-01 MED ORDER — ATORVASTATIN CALCIUM 10 MG PO TABS
10.0000 mg | ORAL_TABLET | Freq: Every day | ORAL | Status: DC
  Administered 2015-03-01 – 2015-03-03 (×3): 10 mg via ORAL
  Filled 2015-03-01 (×4): qty 1

## 2015-03-01 MED ORDER — POTASSIUM CHLORIDE 10 MEQ/50ML IV SOLN
10.0000 meq | INTRAVENOUS | Status: AC
  Administered 2015-03-01 (×2): 10 meq via INTRAVENOUS
  Filled 2015-03-01: qty 50

## 2015-03-01 MED ORDER — POTASSIUM CHLORIDE CRYS ER 20 MEQ PO TBCR
20.0000 meq | EXTENDED_RELEASE_TABLET | Freq: Two times a day (BID) | ORAL | Status: DC
  Administered 2015-03-02 (×2): 20 meq via ORAL
  Filled 2015-03-01 (×4): qty 1

## 2015-03-01 MED FILL — Heparin Sodium (Porcine) Inj 1000 Unit/ML: INTRAMUSCULAR | Qty: 10 | Status: AC

## 2015-03-01 MED FILL — Sodium Chloride IV Soln 0.9%: INTRAVENOUS | Qty: 2000 | Status: AC

## 2015-03-01 MED FILL — Sodium Bicarbonate IV Soln 8.4%: INTRAVENOUS | Qty: 50 | Status: AC

## 2015-03-01 MED FILL — Mannitol IV Soln 20%: INTRAVENOUS | Qty: 500 | Status: AC

## 2015-03-01 MED FILL — Electrolyte-R (PH 7.4) Solution: INTRAVENOUS | Qty: 4000 | Status: AC

## 2015-03-01 MED FILL — Lidocaine HCl IV Inj 20 MG/ML: INTRAVENOUS | Qty: 5 | Status: AC

## 2015-03-01 NOTE — Discharge Summary (Signed)
Physician Discharge Summary  Patient ID: Andrew Ayala MRN: [id number] DOB/AGE: 68-21-1947 68 y.o.  Admit date: 02/27/2015 Discharge date: 03/04/2015  Admission Diagnoses:  Patient Active Problem List   Diagnosis Date Noted  . Severe mitral regurgitation 10/05/2014  . Hx of colonic polyps 02/22/2012  . ED (erectile dysfunction) 02/22/2012  . MVP (mitral valve prolapse) 02/22/2012  . Allergic rhinitis, mild 02/22/2012  . BPH (benign prostatic hyperplasia) 02/22/2012  . Hyperlipidemia LDL goal <100 02/22/2012   Discharge Diagnoses:   Patient Active Problem List   Diagnosis Date Noted  . S/P minimally invasive mitral valve repair 02/27/2015  . Severe mitral regurgitation 10/05/2014  . Hx of colonic polyps 02/22/2012  . ED (erectile dysfunction) 02/22/2012  . MVP (mitral valve prolapse) 02/22/2012  . Allergic rhinitis, mild 02/22/2012  . BPH (benign prostatic hyperplasia) 02/22/2012  . Hyperlipidemia LDL goal <100 02/22/2012   Discharged Condition: good  History of Present Illness:  [name] is a 69 year old male with long-standing history of mitral valve prolapse and heart murmur who has recently been found to have severe mitral regurgitation.  The patient states that he has been told that he had mitral valve prolapse and a heart murmur for much of his adult life. He apparently was evaluated more than 12 years ago at [facility] but was released from their care without follow-up. On recent follow-up examination with the patient's primary care physician the patient was noted to have a prominent systolic murmur on physical exam. The patient was concerned regarding his underlying family history with known strong family history of coronary artery disease and a sister who recently suffered a stroke related to atrial fibrillation. He subsequently underwent transthoracic echocardiogram 09/12/2014 that revealed bileaflet prolapse of the mitral valve with  possibly severe mitral regurgitation. The patient was referred for formal cardiology consultation with Dr. Johnsie Cancel, and he subsequently underwent transesophageal echocardiogram 10/03/2014 that confirmed the presence of mitral valve prolapse with severe mitral regurgitation. There was normal left ventricular size and systolic function with ejection fraction estimated 60-65%.  It was felt he would require surgical intervention at some point and was referred to [facility] for surgical evaluation.  He was initially evaluated by Dr. Roxy Manns on 10/29/2014.  During that time the patient admitted to living a sedentary lifestyle.  He denied symptoms to chest tightness, shortness of breath, dizziness, syncope, or fatigue.  His TTE was reviewed and Dr. Roxy Manns was in agreement the patient would benefit from Mitral Valve Repair.  However, he would require cardiac catheterization prior to proceeding with surgery.  The patient wished to not have surgery until the summer.  He underwent cardiac catheterization which showed normal coronary arteries.  He again presented for follow up with Dr. Roxy Manns on 02/11/2015 at which time the risks and benefits of the procedure were explained to the patient and he was agreeable to proceed.     Hospital Course:   [name] presented to [facility] on 02/27/2015.  He was taken to the operating room and underwent Minimally Invasive Mitral Valve Repair with a 34 mm Sorin Ring.  He tolerated the procedure without difficulty and was taken to the SICU in stable condition.  The patient was extubated the evening of surgery.  During his stay in the SICU the patient was weaned off Nitroglycerin as tolerated.  His SWAN Ganz catheter and arterial lines were removed without difficulty.  He was started on Coumadin for his Mitral Valve Repair.  He is maintaining NSR and  felt medically stable for transfer to the step down unit in stable condition.  The patient has continued to progress.  He continues to maintain  NSR.  His pacing wires have been removed without difficulty.  He remains on Coumadin.  His INR is 1.13 after several doses of Coumadin.  His regimen will be increased to 5 mg daily.  He is ambulating without difficulty.  He is tolerating a regular diet.  He is felt medically stable for discharge today.       Significant Diagnostic Studies: cardiac graphics:   Echocardiogram: - Left ventricle: Systolic function was normal. The estimated ejection fraction was in the range of 60% to 65%. - Aortic valve: No evidence of vegetation. - Mitral valve: Myxomatous valve Severe prolapse P2 segment with severe MR PISA 1.3 Regurgitant volume 53 cc - Left atrium: The atrium was dilated. No evidence of thrombus in the atrial cavity or appendage. - Right atrium: No evidence of thrombus in the atrial cavity or appendage. - Atrial septum: No defect or patent foramen ovale was identified. Echo contrast study showed no right-to-left atrial level shunt, following an increase in RA pressure induced by provocative maneuvers. - Tricuspid valve: A prosthesis was present and functioning normally. The prosthesis had a normal range of motion. The sewing ring appeared normal, had no rocking motion, and showed no evidence of dehiscence. - Pulmonic valve: No evidence of vegetation.  Treatments: surgery:    Minimally-Invasive Mitral Valve Repair Complex valvuloplasty including triangular resection of posterior leaflet Artificial Gore-tex neochord placement x8 Sorin Memo 3D Ring Annuloplasty (size 6mm, catalog # [id number], serial # [id number])  Disposition: 01-Home or Self Care   Discharge Medications:  The patient has been discharged on:   1.Beta Blocker:  Yes [x  ]                              No   [   ]                              If No, reason:  2.Ace Inhibitor/ARB: Yes [   ]                                     No  [  x  ]                                      If No, reason: Labile Blood Pressure  3.Statin:   Yes [   ]                  No  [x   ]                  If No, reason: No CAD  4.Shela Commons:  Yes  [ x  ]                  No   [   ]                  If No, reason:        Medication List    TAKE these medications        ALEVE 220 MG tablet  Generic  drug:  naproxen sodium  Take 220-440 mg by mouth 2 (two) times daily as needed (pain).     amiodarone 200 MG tablet  Commonly known as:  PACERONE  Take 1 tablet (200 mg total) by mouth daily.     amoxicillin 500 MG capsule  Commonly known as:  AMOXIL  Take 2,000 mg by mouth See admin instructions. Take 4 capsules (2000 mg) one hour prior to appointment     aspirin EC 81 MG tablet  Take 81 mg by mouth daily before supper.     atorvastatin 10 MG tablet  Commonly known as:  LIPITOR  Take 10 mg by mouth daily before supper.     finasteride 5 MG tablet  Commonly known as:  PROSCAR  Take 5 mg by mouth daily before supper.     Fish Oil 1200 MG Caps  Take 1 capsule by mouth at bedtime.     GLUCOSAMINE-CHONDROITIN PO  Take 1 tablet by mouth daily. Glucosamine 1500 mg/ chrondroiton 1200 mg     ketotifen 0.025 % ophthalmic solution  Commonly known as:  ZADITOR  Apply 1 drop to eye daily as needed (Itchy Eyes).     metoprolol tartrate 25 MG tablet  Commonly known as:  LOPRESSOR  Take 0.5 tablets (12.5 mg total) by mouth 2 (two) times daily.     multivitamin with minerals tablet  Take 1 tablet by mouth daily.     OCUSOFT LID SCRUB EX  Apply topically every morning.     oxyCODONE 5 MG immediate release tablet  Commonly known as:  Oxy IR/ROXICODONE  Take 1-2 tablets (5-10 mg total) by mouth every 3 (three) hours as needed for severe pain.     pantoprazole 40 MG tablet  Commonly known as:  PROTONIX  Take 1 tablet (40 mg total) by mouth daily.     polyethylene glycol packet  Commonly known as:  MIRALAX  Take 17 g by mouth daily as needed.     warfarin 5 MG tablet   Commonly known as:  COUMADIN  Take 1 tablet (5 mg total) by mouth daily at 6 PM.       Follow-up Information    Follow up with Rexene Alberts, MD On 03/21/2015.   Specialty:  Cardiothoracic Surgery   Why:  Appointment is at 3:00   Contact information:   [employer] [place] [address] [phone]       Follow up with  IMAGING On 03/21/2015.   Why:   Please get CXR at 2:00   Contact information:   [facility]       Follow up with Jenkins Rouge, MD.   Specialty:  Cardiology   Why:  Office will contact you with appointment   Contact information:   [address] [address] [phone]       Follow up with PT/INR On 03/06/2015.   Why:  Will need checked, please contact Dr. Mariana Arn office if you do not hear from them by Tuesday      Signed: Isobelle Tuckett 03/04/2015, 10:33 AM

## 2015-03-01 NOTE — Progress Notes (Signed)
[address]       [place] [address]             [phone]        CARDIOTHORACIC SURGERY PROGRESS NOTE   R2 Days Post-Op Procedure(s) (LRB): MINIMALLY INVASIVE MITRAL VALVE REPAIR (MVR) (Right) TRANSESOPHAGEAL ECHOCARDIOGRAM (TEE) (N/A)  Subjective: Looks good.  Denies pain.  Asking about going to his dermatologist  Objective: Vital signs: BP Readings from Last 1 Encounters:  03/01/15 106/65   Pulse Readings from Last 1 Encounters:  03/01/15 80   Resp Readings from Last 1 Encounters:  03/01/15 17   Temp Readings from Last 1 Encounters:  03/01/15 98.4 F (36.9 C) Oral    Hemodynamics: PAP: (19-24)/(11-15) 24/15 mmHg  Physical Exam:  Rhythm:   sinus  Breath sounds: clear  Heart sounds:  RRR w/out murmur  Incisions:  Clean and dry  Abdomen:  Soft, non-distended, non-tender  Extremities:  Warm, well-perfused  Chest tubes:  Low volume thin serosanguinous output, no air leak    Intake/Output from previous day: 06/09 0701 - 06/10 0700 In: 1300 [P.O.:720; I.V.:480; IV Piggyback:100] Out: 3270 [Urine:2940; Chest Tube:330] Intake/Output this shift:    Lab Results:  CBC: Recent Labs  02/28/15 1605 03/01/15 0600  WBC 19.8* 13.1*  HGB 10.9* 10.2*  HCT 31.4* 29.3*  PLT 99* 73*    BMET:  Recent Labs  02/28/15 0350 02/28/15 1553 02/28/15 1605 03/01/15 0600  NA 137 137  --  138  K 4.2 3.9  --  3.5  CL 107 99*  --  102  CO2 23  --   --  29  GLUCOSE 182* 136*  --  98  BUN 12 17  --  14  CREATININE 0.96 1.00 1.14 0.96  CALCIUM 7.6*  --   --  8.3*     PT/INR:   Recent Labs  03/01/15 0600  LABPROT 16.5*  INR 1.32    CBG (last 3)   Recent Labs  02/28/15 2019 02/28/15 2340 03/01/15 0346  GLUCAP 132* 94 97    ABG    Component Value Date/Time   PHART 7.315* 02/28/2015 0407   PCO2ART 43.7 02/28/2015 0407   PO2ART 91.0 02/28/2015 0407   HCO3 22.1 02/28/2015 0407   TCO2 22 02/28/2015 1553   ACIDBASEDEF 4.0*  02/28/2015 0407   O2SAT 96.0 02/28/2015 0407    CXR: PORTABLE CHEST - 1 VIEW  COMPARISON: Portable chest x-ray of February 28, 2015  FINDINGS: The lungs are adequately inflated. There is persistent subsegmental atelectasis at the right lung base. No pneumothorax is evident. The chest tube and mediastinal drain are unchanged in position. The cardiac silhouette is mildly enlarged. The central pulmonary vascularity is prominent, but the cephalization has decreased. The radiodense ring of the repaired mitral valve is visible. The left internal jugular Cordis sheath tip projects over the distal portion of the left internal jugular vein. The Swan-Ganz catheter has been removed.  IMPRESSION: 1. Improving pulmonary interstitium consistent with resolving pulmonary edema. 2. The right chest tube and the mediastinal drain are stable. There is no pneumothorax or significant pleural effusion. There is persistent mild right basilar subsegmental atelectasis.   Electronically Signed  By: David Martinique M.D.  On: 03/01/2015 07:21   Assessment/Plan: S/P Procedure(s) (LRB): MINIMALLY INVASIVE MITRAL VALVE REPAIR (MVR) (Right) TRANSESOPHAGEAL ECHOCARDIOGRAM (TEE) (N/A)  Doing very well POD2 Maintaining NSR w/ stable BP Expected post op acute blood loss anemia, mild, stable Expected post op  volume excess, mild, diuresing Expected post op atelectasis, mild Post op thrombocytopenia, platelet count 73k this am Hypokalemia - diuretic induced   Mobilize  Diuresis  Leave chest tubes 1 more day  Watch platelet count  Coumadin  Transfer step down   Rexene Alberts 03/01/2015 7:44 AM

## 2015-03-01 NOTE — Progress Notes (Signed)
Transferred to 2W24 via ambulation from 2S unit. No changes. Report given to [facility]

## 2015-03-02 LAB — CBC
HEMATOCRIT: 30.1 % — AB (ref 39.0–52.0)
Hemoglobin: 10.3 g/dL — ABNORMAL LOW (ref 13.0–17.0)
MCH: 32.9 pg (ref 26.0–34.0)
MCHC: 34.2 g/dL (ref 30.0–36.0)
MCV: 96.2 fL (ref 78.0–100.0)
PLATELETS: 79 10*3/uL — AB (ref 150–400)
RBC: 3.13 MIL/uL — ABNORMAL LOW (ref 4.22–5.81)
RDW: 12.9 % (ref 11.5–15.5)
WBC: 11.8 10*3/uL — ABNORMAL HIGH (ref 4.0–10.5)

## 2015-03-02 LAB — BASIC METABOLIC PANEL
Anion gap: 8 (ref 5–15)
BUN: 11 mg/dL (ref 6–20)
CALCIUM: 8.3 mg/dL — AB (ref 8.9–10.3)
CO2: 31 mmol/L (ref 22–32)
Chloride: 99 mmol/L — ABNORMAL LOW (ref 101–111)
Creatinine, Ser: 0.87 mg/dL (ref 0.61–1.24)
Glucose, Bld: 111 mg/dL — ABNORMAL HIGH (ref 65–99)
Potassium: 3.6 mmol/L (ref 3.5–5.1)
Sodium: 138 mmol/L (ref 135–145)

## 2015-03-02 LAB — PROTIME-INR
INR: 1.23 (ref 0.00–1.49)
PROTHROMBIN TIME: 15.6 s — AB (ref 11.6–15.2)

## 2015-03-02 NOTE — Progress Notes (Signed)
dc'ed pacing wires pt. tolerated well 

## 2015-03-02 NOTE — Progress Notes (Signed)
CARDIAC REHAB PHASE I   PRE:  Rate/Rhythm: 74 SR  BP:  Supine:   Sitting: 86/46  Standing:    SaO2: 95 RA  MODE:  Ambulation: 300 ft   POST:  Rate/Rhythm: 97 SR  BP:  Supine:   Sitting: 110/60  Standing:    SaO2: 95 RA 1355-1445 On arrival pt in recliner. When he stood he c/o of feeling a little lightheaded. As he stood it improved. Assisted X 1 and used walker to ambulate. Gait steady with walker and he was able to walk 300 feet.Pt to bed after walk to have some lines discontinued. BP after walk 110/60. Discussed Outpt. CRP with pt and gave information on it. We will continue to follow pt.  Rodney Langton RN 03/02/2015 2:43 PM

## 2015-03-02 NOTE — Progress Notes (Addendum)
       [address]       [place] [address]             [phone]          3 Days Post-Op Procedure(s) (LRB): MINIMALLY INVASIVE MITRAL VALVE REPAIR (MVR) (Right) TRANSESOPHAGEAL ECHOCARDIOGRAM (TEE) (N/A)  Subjective: Feels well, no complaints except soreness at CT site.   Objective: Vital signs in last 24 hours: Patient Vitals for the past 24 hrs:  BP Temp Temp src Pulse Resp SpO2 Weight  03/02/15 0503 131/70 mmHg 98.4 F (36.9 C) Oral 73 18 95 % 207 lb 4.8 oz (94.031 kg)  03/01/15 2157 121/60 mmHg 98.6 F (37 C) Oral 80 18 97 % -  03/01/15 1541 113/68 mmHg 98.3 F (36.8 C) Oral 75 18 98 % -  03/01/15 1500 116/73 mmHg - - 75 17 99 % -  03/01/15 1400 115/70 mmHg - - 72 14 100 % -  03/01/15 1300 119/74 mmHg - - 76 19 99 % -  03/01/15 1212 - 98.6 F (37 C) Oral - - - -  03/01/15 1200 125/71 mmHg - - 69 17 99 % -  03/01/15 1100 - - - 69 11 97 % -   Current Weight  03/02/15 207 lb 4.8 oz (94.031 kg)   BASELINE WEIGHT:91 kg  Intake/Output from previous day: 06/10 0701 - 06/11 0700 In: 536 [P.O.:240; I.V.:46; IV Piggyback:250] Out: 2265 [Urine:2215; Chest Tube:50]    PHYSICAL EXAM:  Heart: RRR Lungs: Clear Wound: Clean and dry Extremities: Trace LE edema    Lab Results: CBC: Recent Labs  03/01/15 0600 03/02/15 0518  WBC 13.1* 11.8*  HGB 10.2* 10.3*  HCT 29.3* 30.1*  PLT 73* 79*   BMET:  Recent Labs  03/01/15 0600 03/02/15 0518  NA 138 138  K 3.5 3.6  CL 102 99*  CO2 29 31  GLUCOSE 98 111*  BUN 14 11  CREATININE 0.96 0.87  CALCIUM 8.3* 8.3*    PT/INR:  Recent Labs  03/02/15 0518  LABPROT 15.6*  INR 1.23      Assessment/Plan: S/P Procedure(s) (LRB): MINIMALLY INVASIVE MITRAL VALVE REPAIR (MVR) (Right) TRANSESOPHAGEAL ECHOCARDIOGRAM (TEE) (N/A)  CV- maintaining SR, BPs stable. Continue Amio, Lopressor, Coumadin.    Vol overload- diurese.  Thrombocytopenia- plts stable, continue to watch.  Expected  postop blood loss anemia- H/H stable.  CRPI, pulm toilet.  CT output minimal, hopefully can d/c CT today.   LOS: 3 days    COLLINS,GINA H 03/02/2015  Holding sinus Coumadin started Wires out today, then chest tubes I have seen and examined Shirlee Limerick and agree with the above assessment  and plan.  Grace Isaac MD Beeper [phone] Office [phone] 03/02/2015 1:26 PM

## 2015-03-03 ENCOUNTER — Inpatient Hospital Stay (HOSPITAL_COMMUNITY): Payer: BLUE CROSS/BLUE SHIELD

## 2015-03-03 LAB — BASIC METABOLIC PANEL
ANION GAP: 10 (ref 5–15)
BUN: 12 mg/dL (ref 6–20)
CO2: 30 mmol/L (ref 22–32)
Calcium: 8.3 mg/dL — ABNORMAL LOW (ref 8.9–10.3)
Chloride: 96 mmol/L — ABNORMAL LOW (ref 101–111)
Creatinine, Ser: 1.05 mg/dL (ref 0.61–1.24)
GFR calc non Af Amer: >60 mL/min (ref >60)
Glucose, Bld: 110 mg/dL — ABNORMAL HIGH (ref 65–99)
Potassium: 3.5 mmol/L (ref 3.5–5.1)
Sodium: 136 mmol/L (ref 135–145)

## 2015-03-03 LAB — CBC
HCT: 30.1 % — ABNORMAL LOW (ref 39.0–52.0)
Hemoglobin: 10.3 g/dL — ABNORMAL LOW (ref 13.0–17.0)
MCH: 32.9 pg (ref 26.0–34.0)
MCHC: 34.2 g/dL (ref 30.0–36.0)
MCV: 96.2 fL (ref 78.0–100.0)
Platelets: 96 10*3/uL — ABNORMAL LOW (ref 150–400)
RBC: 3.13 MIL/uL — AB (ref 4.22–5.81)
RDW: 12.8 % (ref 11.5–15.5)
WBC: 9.3 10*3/uL (ref 4.0–10.5)

## 2015-03-03 LAB — PROTIME-INR
INR: 1.2 (ref 0.00–1.49)
Prothrombin Time: 15.3 seconds — ABNORMAL HIGH (ref 11.6–15.2)

## 2015-03-03 MED ORDER — POTASSIUM CHLORIDE CRYS ER 20 MEQ PO TBCR
40.0000 meq | EXTENDED_RELEASE_TABLET | Freq: Two times a day (BID) | ORAL | Status: AC
  Administered 2015-03-03 (×2): 40 meq via ORAL
  Filled 2015-03-03: qty 2

## 2015-03-03 NOTE — Progress Notes (Signed)
Patient ambulated 150 ft via rolling walker on room air.  Patient tolerated well.  Patient with call bell with in reach. RN will continue to monitor

## 2015-03-03 NOTE — Progress Notes (Addendum)
       [address]       [place] [address]             [phone]          4 Days Post-Op Procedure(s) (LRB): MINIMALLY INVASIVE MITRAL VALVE REPAIR (MVR) (Right) TRANSESOPHAGEAL ECHOCARDIOGRAM (TEE) (N/A)  Subjective: Slept poorly last night, had cough with a lot of phlegm, but improved after getting up to chair.  Eating better, bowels working. Getting a little lightheaded initially when getting OOB.   Objective: Vital signs in last 24 hours: Patient Vitals for the past 24 hrs:  BP Temp Temp src Pulse Resp SpO2 Weight  03/03/15 0417 95/68 mmHg 97.7 F (36.5 C) Oral 78 20 94 % 203 lb 8.8 oz (92.33 kg)  03/02/15 1700 (!) 100/59 mmHg - - 89 20 95 % -  03/02/15 1600 (!) 96/58 mmHg - - 75 20 96 % -  03/02/15 1530 (!) 98/57 mmHg - - 80 20 91 % -  03/02/15 1523 (!) 98/53 mmHg 98.9 F (37.2 C) Oral 74 18 96 % -  03/02/15 1500 (!) 100/58 mmHg 98 F (36.7 C) Oral 71 20 94 % -   Current Weight  03/03/15 203 lb 8.8 oz (92.33 kg)  BASELINE WEIGHT:91 kg   Intake/Output from previous day: 06/11 0701 - 06/12 0700 In: 240 [P.O.:240] Out: 1250 [Urine:1250]    PHYSICAL EXAM:  Heart: RRR Lungs: Clear Wound: Clean and dry Extremities: Minimal LE edema    Lab Results: CBC: Recent Labs  03/02/15 0518 03/03/15 0453  WBC 11.8* 9.3  HGB 10.3* 10.3*  HCT 30.1* 30.1*  PLT 79* 96*   BMET:  Recent Labs  03/02/15 0518 03/03/15 0453  NA 138 136  K 3.6 3.5  CL 99* 96*  CO2 31 30  GLUCOSE 111* 110*  BUN 11 12  CREATININE 0.87 1.05  CALCIUM 8.3* 8.3*    PT/INR:  Recent Labs  03/03/15 0453  LABPROT 15.3*  INR 1.20      Assessment/Plan: S/P Procedure(s) (LRB): MINIMALLY INVASIVE MITRAL VALVE REPAIR (MVR) (Right) TRANSESOPHAGEAL ECHOCARDIOGRAM (TEE) (N/A)  CV- maintaining SR, BPs low normal but stable. Continue Amio, Lopressor, Coumadin.   Vol overload- Continue diuresis.  Thrombocytopenia- plts starting to trend up. Continue to  monitor.  Hypokalemia- replace K+.  Expected postop blood loss anemia- H/H stable.  CRPI, pulm toilet.  Disp- home in am if he continues to progress.   LOS: 4 days    COLLINS,GINA H 03/03/2015 Lab Results  Component Value Date   INR 1.20 03/03/2015   INR 1.23 03/02/2015   INR 1.32 03/01/2015   Poss home tomorrow I have seen and examined Shirlee Limerick and agree with the above assessment  and plan.  Grace Isaac MD Beeper [phone] Office [phone] 03/03/2015 11:00 AM

## 2015-03-04 LAB — CBC
HEMATOCRIT: 31.1 % — AB (ref 39.0–52.0)
HEMOGLOBIN: 10.7 g/dL — AB (ref 13.0–17.0)
MCH: 33 pg (ref 26.0–34.0)
MCHC: 34.4 g/dL (ref 30.0–36.0)
MCV: 96 fL (ref 78.0–100.0)
Platelets: 120 10*3/uL — ABNORMAL LOW (ref 150–400)
RBC: 3.24 MIL/uL — AB (ref 4.22–5.81)
RDW: 12.7 % (ref 11.5–15.5)
WBC: 7.7 10*3/uL (ref 4.0–10.5)

## 2015-03-04 LAB — BASIC METABOLIC PANEL
ANION GAP: 12 (ref 5–15)
BUN: 12 mg/dL (ref 6–20)
CHLORIDE: 102 mmol/L (ref 101–111)
CO2: 27 mmol/L (ref 22–32)
CREATININE: 0.98 mg/dL (ref 0.61–1.24)
Calcium: 8.8 mg/dL — ABNORMAL LOW (ref 8.9–10.3)
GFR calc non Af Amer: >60 mL/min (ref >60)
Glucose, Bld: 101 mg/dL — ABNORMAL HIGH (ref 65–99)
POTASSIUM: 4.4 mmol/L (ref 3.5–5.1)
Sodium: 141 mmol/L (ref 135–145)

## 2015-03-04 LAB — PROTIME-INR
INR: 1.13 (ref 0.00–1.49)
Prothrombin Time: 14.7 seconds (ref 11.6–15.2)

## 2015-03-04 MED ORDER — WARFARIN SODIUM 5 MG PO TABS: 5.0000 mg | ORAL_TABLET | Freq: Every day | ORAL | Status: DC

## 2015-03-04 MED ORDER — METOPROLOL TARTRATE 25 MG PO TABS: 12.5000 mg | ORAL_TABLET | Freq: Two times a day (BID) | ORAL | Status: DC

## 2015-03-04 MED ORDER — OXYCODONE HCL 5 MG PO TABS: 5.0000 mg | ORAL_TABLET | ORAL | Status: DC | PRN

## 2015-03-04 MED ORDER — POLYETHYLENE GLYCOL 3350 17 G PO PACK: 17.0000 g | PACK | Freq: Every day | ORAL | Status: DC | PRN

## 2015-03-04 MED ORDER — PANTOPRAZOLE SODIUM 40 MG PO TBEC: 40.0000 mg | DELAYED_RELEASE_TABLET | Freq: Every day | ORAL | Status: DC

## 2015-03-04 MED ORDER — AMIODARONE HCL 200 MG PO TABS: 200.0000 mg | ORAL_TABLET | Freq: Every day | ORAL | Status: DC

## 2015-03-04 NOTE — Care Management Note (Signed)
Case Management Note Marvetta Gibbons RN, BSN Unit 2W-Case Manager [phone]  Patient Details  Name: Andrew Ayala MRN: [id number] Date of Birth: [birth year]  Subjective/Objective:     Pt admitted s/p mini MVR               Action/Plan: PTA pt lived at home- with spouse- plan to return home, no needs   Expected Discharge Date:       03/04/15           Expected Discharge Plan:  Home/Self Care  In-House Referral:     Discharge planning Services  CM Consult  Post Acute Care Choice:    Choice offered to:     DME Arranged:    DME Agency:     HH Arranged:    [place] Agency:     Status of Service:  Completed, signed off  Medicare Important Message Given:  Yes Date Medicare IM Given:  03/04/15 Medicare IM give by:  Marvetta Gibbons RN< BSN  Date Additional Medicare IM Given:    Additional Medicare Important Message give by:     If discussed at [place] of Stay Meetings, dates discussed:    Additional Comments:  Dawayne Patricia, RN 03/04/2015, 10:58 AM

## 2015-03-04 NOTE — Progress Notes (Signed)
Discharge Med Rec Note: Andrew Ayala, Andrew Ayala  The following medications were initiated, discontinued, or adjusted during Andrew Ayala' stay for minimally invasive mitral valve repair on 02/27/2015.  Initiated: Metoprolol 12.5 PO bid. In the setting of MV repair, Andrew Ayala was initiated on metoprolol to decrease the risk of post-operative atrial fibrillation, myocardial ischemia and mortality.  Warfarin 5 mg PO daily.  In the setting of MV repair, Andrew Ayala was initiated on warfarin 2.5 mg PO daily on 6/9 to reduce the risk of thrombosis. On the day of discharge, his INR was SUBtherapeutic at 1.13 (goal 2-3). Due to his postoperative anemia, it was decided not to send him home on bridging anticoagulation. He will require close follow up of INR.   Oxycodone 5-10 mg PO PRN for severe pain. Andrew Ayala was discharged with a prescription for this medication for as needed use of severe postoperative pain.  Protonix 40 mg PO daily. Andrew Ayala was initiated on Protonix for control of GERD like symptoms.   Miralax 17 gm PO daily PRN for constipation. This medication was prescribed for Andrew Ayala for as needed use if he should develop constipation with opioid use for pain control.   Adjusted: Amiodarone 200 mg PO BID decreased to 200 mg PO daily. This medication was initiated prior to Andrew Ayala' surgery to reduce the risk of perioperative atrial dysrhythmias and the dose has now been decreased with plans to discontinue in the future if he continues to maintain normal sinus rhythm.   Discontinued: Naproxen PRN. Andrew Ayala reported taking naproxen for back pain prior to admission. In the setting of concomitant aspirin and warfarin use increasing the risk for bleeding and potential cardiovascular risk, he was advised NOT to take this medication any further. He was informed to take Tylenol for aches and pains after his peri-op opiate Rx runs out.   Meds were reconciled in Epic and patient was  counseled on date of discharge.  Ruta Ayala. Andrew Ayala, PharmD, [place] Clinical Pharmacist - Resident Pager: [phone] Pharmacy: [phone] 03/04/2015 9:54 AM

## 2015-03-04 NOTE — Progress Notes (Signed)
Utilization review completed.  

## 2015-03-04 NOTE — Progress Notes (Signed)
Ed completed with pt and wife. Voiced understanding and requests his name be sent to [facility]. Will send. [place], [place] [id number] 10:43 AM 03/04/2015

## 2015-03-04 NOTE — Progress Notes (Addendum)
      [address]       [place], [address]             [phone]      5 Days Post-Op Procedure(s) (LRB): MINIMALLY INVASIVE MITRAL VALVE REPAIR (MVR) (Right) TRANSESOPHAGEAL ECHOCARDIOGRAM (TEE) (N/A)   Subjective:  Andrew Ayala complains of some constipation this morning.  He has moved his bowels since surgery and I explained to the patient that he may not move his bowel daily with use of Narcotics.   Objective: Vital signs in last 24 hours: Temp:  [98 F (36.7 C)-98.8 F (37.1 C)] 98 F (36.7 C) (06/13 0513) Pulse Rate:  [74-80] 80 (06/13 0513) Cardiac Rhythm:  [-]  Resp:  [16-18] 18 (06/13 0513) BP: (104-110)/(54-67) 110/67 mmHg (06/13 0513) SpO2:  [95 %-98 %] 97 % (06/13 0513) Weight:  [200 lb 13.4 oz (91.1 kg)] 200 lb 13.4 oz (91.1 kg) (06/13 0513)  Intake/Output from previous day: 06/12 0701 - 06/13 0700 In: 480 [P.O.:480] Out: 2875 [Urine:2875]  General appearance: alert, cooperative and no distress Heart: regular rate and rhythm Lungs: clear to auscultation bilaterally Abdomen: soft, non-tender; bowel sounds normal; no masses,  no organomegaly Extremities: edema trace Wound: clean and dry  Lab Results:  Recent Labs  03/03/15 0453 03/04/15 0245  WBC 9.3 7.7  HGB 10.3* 10.7*  HCT 30.1* 31.1*  PLT 96* 120*   BMET:  Recent Labs  03/03/15 0453 03/04/15 0245  NA 136 141  K 3.5 4.4  CL 96* 102  CO2 30 27  GLUCOSE 110* 101*  BUN 12 12  CREATININE 1.05 0.98  CALCIUM 8.3* 8.8*    PT/INR:  Recent Labs  03/04/15 0245  LABPROT 14.7  INR 1.13   ABG    Component Value Date/Time   PHART 7.315* 02/28/2015 0407   HCO3 22.1 02/28/2015 0407   TCO2 22 02/28/2015 1553   ACIDBASEDEF 4.0* 02/28/2015 0407   O2SAT 96.0 02/28/2015 0407   CBG (last 3)   Recent Labs  03/01/15 0820  GLUCAP 101*    Assessment/Plan: S/P Procedure(s) (LRB): MINIMALLY INVASIVE MITRAL VALVE REPAIR (MVR) (Right) TRANSESOPHAGEAL ECHOCARDIOGRAM (TEE)  (N/A)  1. CV- NSR, BPs stable- continue Amiodarone, Lopressor 2. INR 1.13, patient not responding to 2.5 mg of coumadin will increase to 5 mg daily 3. Pulm- no acute issues, continue IS 4. Renal- creatinine stable, weight is at baseline 5. DIspo- patient stable, will d/c home today   LOS: 5 days    BARRETT, ERIN 03/04/2015  I have seen and examined the patient and agree with the assessment and plan as outlined.  Rexene Alberts 03/04/2015 8:30 AM

## 2015-03-04 NOTE — Discharge Instructions (Addendum)
Mitral Valve Repair, Care After Refer to this sheet in the next few weeks. These instructions provide you with information on caring for yourself after your procedure. Your health care provider may also give you specific instructions. Your treatment has been planned according to current medical practices, but problems sometimes occur. Call your health care provider if you have any problems or questions after your procedure.  HOME CARE INSTRUCTIONS   Take medicines only as directed by your health care provider.  Take your temperature every morning for the first 7 days after surgery. Write these down.  Weigh yourself every morning for at least 7 days after surgery. Write your weight down.  Wear elastic stockings during the day for at least 2 weeks after surgery or as directed by your health care provider. Use them longer if your ankles are swollen. The stockings help blood flow and help reduce swelling in the legs.  Take frequent naps or rest often throughout the day.  Avoid lifting more than 10 lb (4.5 kg) or pushing or pulling things with your arms for 6-8 weeks or as directed by your health care provider.  Avoid driving or airplane travel for 4-6 weeks after surgery or as directed. If you are riding in a car for an extended period, stop every 1-2 hours to stretch your legs.  Avoid crossing your legs.  Avoid climbing stairs and using the handrail to pull yourself up for the first 2-3 weeks after surgery.  Do not take baths for 2-4 weeks after surgery. Take showers once your health care provider approves. Pat incisions dry. Do not rub incisions with a washcloth or towel.  Return to work as directed by your health care provider.  Drink enough fluids to keep your urine clear or pale yellow.  Do not strain to have a bowel movement. Eat high-fiber foods if you become constipated. You may also take a medicine to help you have a bowel movement (laxative) as directed by your health care  provider.  Resume sexual activity as directed by your health care provider. SEEK MEDICAL CARE IF:   You develop a skin rash.   Your weight is increasing each day over 2-3 days.  Your weight increases by 2 or more pounds (1 kg) in a single day.  You have a fever. SEEK IMMEDIATE MEDICAL CARE IF:   You develop chest pain that is not coming from your incision.  You develop shortness of breath or difficulty breathing.  You have drainage, redness, swelling, or pain at your incision site.  You have pus coming from your incision.  You develop light-headedness. MAKE SURE YOU:  Understand these directions.  Will watch your condition.  Will get help right away if you are not doing well or get worse. Document Released: 03/27/2005 Document Revised: 01/22/2014 Document Reviewed: 02/07/2013 [facility] Patient Information 2015 [place], [place]. This information is not intended to replace advice given to you by your health care provider. Make sure you discuss any questions you have with your health care provider.  Information on my medicine - Coumadin   (Warfarin)  This medication education was reviewed with me or my healthcare representative as part of my discharge preparation.  The pharmacist that spoke with me during my hospital stay was:  COOPER, JULIETTE C, [place]  Why was Coumadin prescribed for you? Coumadin was prescribed for you because you have a blood clot or a medical condition that can cause an increased risk of forming blood clots. Blood clots can cause serious health  problems by blocking the flow of blood to the heart, lung, or brain. Coumadin can prevent harmful blood clots from forming. As a reminder your indication for Coumadin is:   Blood Clot Prevention After Heart Valve Surgery  What test will check on my response to Coumadin? While on Coumadin (warfarin) you will need to have an INR test regularly to ensure that your dose is keeping you in the desired range. The INR  (international normalized ratio) number is calculated from the result of the laboratory test called prothrombin time (PT).  If an INR APPOINTMENT HAS NOT ALREADY BEEN MADE FOR YOU please schedule an appointment to have this lab work done by your health care provider within 7 days. Your INR goal is usually a number between:  2 to 3 or your provider may give you a more narrow range like 2-2.5.  Ask your health care provider during an office visit what your goal INR is.  What  do you need to  know  About  COUMADIN? Take Coumadin (warfarin) exactly as prescribed by your healthcare provider about the same time each day.  DO NOT stop taking without talking to the doctor who prescribed the medication.  Stopping without other blood clot prevention medication to take the place of Coumadin may increase your risk of developing a new clot or stroke.  Get refills before you run out.  What do you do if you miss a dose? If you miss a dose, take it as soon as you remember on the same day then continue your regularly scheduled regimen the next day.  Do not take two doses of Coumadin at the same time.  Important Safety Information A possible side effect of Coumadin (Warfarin) is an increased risk of bleeding. You should call your healthcare provider right away if you experience any of the following: ? Bleeding from an injury or your nose that does not stop. ? Unusual colored urine (red or dark brown) or unusual colored stools (red or black). ? Unusual bruising for unknown reasons. ? A serious fall or if you hit your head (even if there is no bleeding).  Some foods or medicines interact with Coumadin (warfarin) and might alter your response to warfarin. To help avoid this: ? Eat a balanced diet, maintaining a consistent amount of Vitamin K. ? Notify your provider about major diet changes you plan to make. ? Avoid alcohol or limit your intake to 1 drink for women and 2 drinks for men per day. (1 drink is 5 oz.  wine, 12 oz. beer, or 1.5 oz. liquor.)  Make sure that ANY health care provider who prescribes medication for you knows that you are taking Coumadin (warfarin).  Also make sure the healthcare provider who is monitoring your Coumadin knows when you have started a new medication including herbals and non-prescription products.  Coumadin (Warfarin)  Major Drug Interactions  Increased Warfarin Effect Decreased Warfarin Effect  Alcohol (large quantities) Antibiotics (esp. Septra/Bactrim, Flagyl, Cipro) Amiodarone (Cordarone) Aspirin (ASA) Cimetidine (Tagamet) Megestrol (Megace) NSAIDs (ibuprofen, naproxen, etc.) Piroxicam (Feldene) Propafenone (Rythmol SR) Propranolol (Inderal) Isoniazid (INH) Posaconazole (Noxafil) Barbiturates (Phenobarbital) Carbamazepine (Tegretol) Chlordiazepoxide (Librium) Cholestyramine (Questran) Griseofulvin Oral Contraceptives Rifampin Sucralfate (Carafate) Vitamin K   Coumadin (Warfarin) Major Herbal Interactions  Increased Warfarin Effect Decreased Warfarin Effect  Garlic Ginseng Ginkgo biloba Coenzyme Q10 Green tea St. Johns wort    Coumadin (Warfarin) FOOD Interactions  Eat a consistent number of servings per week of foods HIGH in Vitamin K (1 serving =  cup)  Collards (cooked, or boiled & drained) Kale (cooked, or boiled & drained) Mustard greens (cooked, or boiled & drained) Parsley *serving size only =  cup Spinach (cooked, or boiled & drained) Swiss chard (cooked, or boiled & drained) Turnip greens (cooked, or boiled & drained)  Eat a consistent number of servings per week of foods MEDIUM-HIGH in Vitamin K (1 serving = 1 cup)  Asparagus (cooked, or boiled & drained) Broccoli (cooked, boiled & drained, or raw & chopped) Brussel sprouts (cooked, or boiled & drained) *serving size only =  cup Lettuce, raw (green leaf, endive, romaine) Spinach, raw Turnip greens, raw & chopped   These websites have more information on Coumadin  (warfarin):  FailFactory.se; VeganReport.com.au;

## 2015-03-04 NOTE — Progress Notes (Signed)
Chest tube sutures removed at this time; steri strips applied; pt to d/c home today with wife; IV and tele monitor d/c at this time as well; will cont. To monitor.

## 2015-03-04 NOTE — Progress Notes (Signed)
Pt and wife given d/c instructions; both verbalized understanding; pt given prescriptions and new medication information sheets; will cont. To monitor.

## 2015-03-06 ENCOUNTER — Ambulatory Visit (INDEPENDENT_AMBULATORY_CARE_PROVIDER_SITE_OTHER): Payer: BLUE CROSS/BLUE SHIELD | Admitting: Pharmacist

## 2015-03-06 DIAGNOSIS — Z9889 Other specified postprocedural states: Secondary | ICD-10-CM | POA: Diagnosis not present

## 2015-03-06 LAB — POCT INR: INR: 1.4

## 2015-03-07 ENCOUNTER — Encounter: Payer: Self-pay | Admitting: Thoracic Surgery (Cardiothoracic Vascular Surgery)

## 2015-03-07 ENCOUNTER — Ambulatory Visit (INDEPENDENT_AMBULATORY_CARE_PROVIDER_SITE_OTHER): Payer: Self-pay | Admitting: Thoracic Surgery (Cardiothoracic Vascular Surgery)

## 2015-03-07 VITALS — BP 97/64 | HR 78 | Resp 16 | Ht 75.0 in | Wt 203.0 lb

## 2015-03-07 DIAGNOSIS — I341 Nonrheumatic mitral (valve) prolapse: Secondary | ICD-10-CM

## 2015-03-07 DIAGNOSIS — I340 Nonrheumatic mitral (valve) insufficiency: Secondary | ICD-10-CM

## 2015-03-07 DIAGNOSIS — Z9889 Other specified postprocedural states: Secondary | ICD-10-CM

## 2015-03-07 MED ORDER — FUROSEMIDE 20 MG PO TABS: 40.0000 mg | ORAL_TABLET | Freq: Every day | ORAL | Status: DC

## 2015-03-07 MED ORDER — POTASSIUM CHLORIDE CRYS ER 10 MEQ PO TBCR: 20.0000 meq | EXTENDED_RELEASE_TABLET | Freq: Every day | ORAL | Status: DC

## 2015-03-07 NOTE — Progress Notes (Signed)
[address]       [place], [address]             [phone]     CARDIOTHORACIC SURGERY OFFICE NOTE  Referring Provider is Josue Hector, MD PCP is Wyatt Haste, MD   HPI:  Patient returns to the office today for follow-up status post minimally invasive mitral valve repair on 02/27/2015. His early postoperative recovery in the hospital was entirely uncomplicated and he was discharged home on the 5th postoperative day. Since hospital discharge the patient has done well, but he presents to the office as an unscheduled visit today because of concerns regarding swelling in his right groin. He reports that he was walking a fair amount yesterday and he was concerned he might have "pulled something" in his groin. He has minimal residual soreness in his chest. He complains that the skin around his chest is quite sensitive to the touch. He has been sleeping fairly well at night. Appetite is improving. He denies any shortness of breath.  He has had some increasing bilateral lower extremity edema. Weight has remained stable since hospital discharge and remains several pounds above his preop baseline.  His INR was checked yesterday at the Coumadin clinic and was trending up but still subtherapeutic at 1.4.  His Coumadin dose was not adjusted and he has been scheduled for repeat check early next week.   Current Outpatient Prescriptions  Medication Sig Dispense Refill  . amiodarone (PACERONE) 200 MG tablet Take 1 tablet (200 mg total) by mouth daily. 30 tablet 1  . amoxicillin (AMOXIL) 500 MG capsule Take 2,000 mg by mouth See admin instructions. Take 4 capsules (2000 mg) one hour prior to appointment    . aspirin EC 81 MG tablet Take 81 mg by mouth daily before supper.    Marland Kitchen atorvastatin (LIPITOR) 10 MG tablet Take 10 mg by mouth daily before supper.    . Eyelid Cleansers (OCUSOFT LID SCRUB EX) Apply topically every morning.    . finasteride (PROSCAR) 5 MG tablet Take 5  mg by mouth daily before supper.    Marland Kitchen GLUCOSAMINE-CHONDROITIN PO Take 1 tablet by mouth daily. Glucosamine 1500 mg/ chrondroiton 1200 mg    . ketotifen (ZADITOR) 0.025 % ophthalmic solution Apply 1 drop to eye daily as needed (Itchy Eyes).    . metoprolol tartrate (LOPRESSOR) 25 MG tablet Take 0.5 tablets (12.5 mg total) by mouth 2 (two) times daily. 60 tablet 3  . Multiple Vitamins-Minerals (MULTIVITAMIN WITH MINERALS) tablet Take 1 tablet by mouth daily.    . naproxen sodium (ALEVE) 220 MG tablet Take 220-440 mg by mouth 2 (two) times daily as needed (pain).    . Omega-3 Fatty Acids (FISH OIL) 1200 MG CAPS Take 1 capsule by mouth at bedtime.    Marland Kitchen oxyCODONE (OXY IR/ROXICODONE) 5 MG immediate release tablet Take 1-2 tablets (5-10 mg total) by mouth every 3 (three) hours as needed for severe pain. 30 tablet 0  . pantoprazole (PROTONIX) 40 MG tablet Take 1 tablet (40 mg total) by mouth daily. 30 tablet 0  . polyethylene glycol (MIRALAX) packet Take 17 g by mouth daily as needed. 30 each 0  . warfarin (COUMADIN) 5 MG tablet Take 1 tablet (5 mg total) by mouth daily at 6 PM. 30 tablet 3   No current facility-administered medications for this visit.      Physical Exam:   BP 97/64 mmHg  Pulse 78  Resp 16  Ht  6\' 3"  (1.905 m)  Wt 203 lb (92.08 kg)  BMI 25.37 kg/m2  SpO2 98%  General:  Well-appearing  Chest:   Clear with symmetrical breath sounds  CV:   Regular rate and rhythm without murmur  Incisions:  Healing nicely. There is mild swelling in the right groin that is firm and consistent with normal postoperative recovery with no signs of subcutaneous fluid collection or lymphocele  Abdomen:  Soft and nontender  Extremities:  Warm and well-perfused. There is moderate bilateral lower extremity edema, right greater than left  Diagnostic Tests:  n/a   Impression:  Patient is doing well just 8 days following a minimally invasive mitral valve repair.  He does have significant bilateral  lower extremity edema and his weight remains several pounds above his preoperative baseline.   Plan:  We will resume oral Lasix and potassium. The patient will continue to monitor his weight on a daily basis. He has been instructed to keep his legs elevated whenever he is not up and ambulating. All of his questions have been addressed. He will return for further follow-up in 2 weeks as previously planned.    Valentina Gu. Roxy Manns, MD 03/07/2015 4:18 PM

## 2015-03-07 NOTE — Progress Notes (Signed)
Patient ID: Andrew Ayala, male   DOB: 1946-05-29, 69 y.o.   MRN: [id number]    Cardiology Office Note   Date:  03/11/2015   ID:  Andrew Ayala, DOB [birth year], MRN [id number]  PCP:  Wyatt Haste, MD  Cardiologist:   Jenkins Rouge, MD   No chief complaint on file.     History of Present Illness: Andrew Ayala is a 69 y.o. male who presents for evaluation of MR.  Long standing history of MVP  Seen by [facility] about 12 years ago and told he was fine.  Despite murmur no f/u echo done since then.  Still working in [employer] but is otherwise sedentary.  No chest pain dyspnea palpitations or syncope CRF;s elevated lipids on statin No hisotry of rheumatic fever, connective tissue disease or CAD.  Had TTE done Echo done 09/12/14 reviewed   Study Conclusions  - Left ventricle: The cavity size was normal. Wall thickness was normal. Systolic function was normal. The estimated ejection fraction was in the range of 55% to 60%. - Mitral valve: Bileaflet prolapse worse in the posterior leaflet with more anteriorly directed MR. Degree of MR varies from parasternal to apical views but overall does appear severe Consider TEE to further evaluate since valve would be repairable - Left atrium: The atrium was mildly dilated. - Atrial septum: No defect or patent foramen ovale was identified.  F/U TEE done by me on 1/13 showed no flail but severe prolapse of middle scallop of P2 with PISA 1.3 and RV of 51cc No CHF, chest pain  Occasional palpitations and PAC;s noted during TEE No history of TIA/CVA  Cath 02/08/15:  Reviewed:  Normal coronary arteries normal EF no right heart done  02/27/15 Surgical repair Roxy Manns:  Minimally-Invasive Mitral Valve Repair Complex valvuloplasty including triangular resection of posterior leaflet Artificial Gore-tex neochord placement x8 Sorin Memo 3D Ring Annuloplasty (size 76mm, catalog # [id number], serial #  [id number])  Lots of muscular pains and some sinus congestion   Past Medical History  Diagnosis Date  . Sensory - neural hearing loss   . Vasomotor rhinitis   . Allergic rhinitis   . MVP (mitral valve prolapse)   . BPH (benign prostatic hyperplasia)   . Hyperlipidemia   . Severe mitral regurgitation 10/05/2014  . Heart murmur   . Acne rosacea   . S/P minimally invasive mitral valve repair 02/27/2015    Complex valvuloplasty including triangular resection of flail segment of posterior leaflet, artificial Gore-tex neochord placement x8 and 34 mm Sorin Memo 3D Rechord ring annuloplasty    Past Surgical History  Procedure Laterality Date  . Colonoscopy  2007    Dr. Henrene Pastor  . Hand surgery  2001    right  . Wart removal  1997    cryo surgery (penis)  . Wisdom tooth extraction  1994  . Septoplasty  1998  . Undescended testicle  1951  . Tee without cardioversion N/A 10/03/2014    Procedure: TRANSESOPHAGEAL ECHOCARDIOGRAM (TEE);  Surgeon: Josue Hector, MD;  Location: [facility] ENDOSCOPY;  Service: Cardiovascular;  Laterality: N/A;  . Cardiac catheterization N/A 02/08/2015    Procedure: Left Heart Cath And Coronary Angiography;  Surgeon: Josue Hector, MD;  Location: [place] CV LAB;  Service: Cardiovascular;  Laterality: N/A;  . Mitral valve repair Right 02/27/2015    Procedure: MINIMALLY INVASIVE MITRAL VALVE REPAIR (MVR);  Surgeon: Rexene Alberts, MD;  Location: [place];  Service: Open Heart Surgery;  Laterality: Right;  . Darden Dates  without cardioversion N/A 02/27/2015    Procedure: TRANSESOPHAGEAL ECHOCARDIOGRAM (TEE);  Surgeon: Rexene Alberts, MD;  Location: [facility];  Service: Open Heart Surgery;  Laterality: N/A;     Current Outpatient Prescriptions  Medication Sig Dispense Refill  . amiodarone (PACERONE) 200 MG tablet Take 1 tablet (200 mg total) by mouth daily. 30 tablet 1  . amoxicillin (AMOXIL) 500 MG capsule Take 2,000 mg by mouth See admin instructions. Take 4 capsules (2000 mg) one hour prior  to appointment    . aspirin EC 81 MG tablet Take 81 mg by mouth daily before supper.    Marland Kitchen atorvastatin (LIPITOR) 10 MG tablet Take 10 mg by mouth daily before supper.    . Eyelid Cleansers (OCUSOFT LID SCRUB EX) Apply topically every morning.    . finasteride (PROSCAR) 5 MG tablet Take 5 mg by mouth daily before supper.    . furosemide (LASIX) 20 MG tablet Take 2 tablets (40 mg total) by mouth daily. 60 tablet 1  . GLUCOSAMINE-CHONDROITIN PO Take 1 tablet by mouth daily. Glucosamine 1500 mg/ chrondroiton 1200 mg    . ketotifen (ZADITOR) 0.025 % ophthalmic solution Apply 1 drop to eye daily as needed (Itchy Eyes).    . metoprolol tartrate (LOPRESSOR) 25 MG tablet Take 0.5 tablets (12.5 mg total) by mouth 2 (two) times daily. 60 tablet 3  . Multiple Vitamins-Minerals (MULTIVITAMIN WITH MINERALS) tablet Take 1 tablet by mouth daily.    . naproxen sodium (ALEVE) 220 MG tablet Take 220-440 mg by mouth 2 (two) times daily as needed (pain).    . Omega-3 Fatty Acids (FISH OIL) 1200 MG CAPS Take 1 capsule by mouth at bedtime.    Marland Kitchen oxyCODONE (OXY IR/ROXICODONE) 5 MG immediate release tablet Take 1-2 tablets (5-10 mg total) by mouth every 3 (three) hours as needed for severe pain. 30 tablet 0  . pantoprazole (PROTONIX) 40 MG tablet Take 1 tablet (40 mg total) by mouth daily. 30 tablet 0  . polyethylene glycol (MIRALAX) packet Take 17 g by mouth daily as needed. 30 each 0  . potassium chloride (K-DUR,KLOR-CON) 10 MEQ tablet Take 2 tablets (20 mEq total) by mouth daily. 60 tablet 1  . warfarin (COUMADIN) 5 MG tablet Take 1 tablet (5 mg total) by mouth daily at 6 PM. 30 tablet 3   No current facility-administered medications for this visit.    Allergies:   Review of patient's allergies indicates no known allergies.    Social History:  The patient  reports that he has never smoked. He has never used smokeless tobacco. He reports that he drinks alcohol. He reports that he does not use illicit drugs.    Family History:  The patient's family history includes Colon cancer (age of onset: 84) in his paternal uncle; Colon cancer (age of onset: 66) in his paternal uncle; Heart attack in his father; Heart failure in his mother; Stroke in his sister. There is no history of Stomach cancer.    ROS:  Please see the history of present illness.   Otherwise, review of systems are positive for none.   All other systems are reviewed and negative.    PHYSICAL EXAM: VS:  Ht 6' 3.5" (1.918 m)  Wt 87.544 kg (193 lb)  BMI 23.80 kg/m2 , BMI Body mass index is 23.8 kg/(m^2). GEN: Well nourished, well developed, in no acute distress HEENT: normal Neck: no JVD, carotid bruits, or masses Cardiac: RRR; no   murmurs, rubs, or gallops,no edema  Respiratory:  clear to auscultation bilaterally, normal work of breathing GI: soft, nontender, nondistended, + BS MS: no deformity or atrophy Skin: warm and dry, no rash Neuro:  Strength and sensation are intact Psych: euthymic mood, full affect   EKG:   10/05/14  EKG  SR rate 56 normal     Recent Labs: 02/25/2015: ALT 21 02/28/2015: Magnesium 2.2 03/04/2015: BUN 12; Creatinine, Ser 0.98; Hemoglobin 10.7*; Platelets 120*; Potassium 4.4; Sodium 141    Lipid Panel    Component Value Date/Time   CHOL 151 09/10/2014 1535   TRIG 105 09/10/2014 1535   HDL 39* 09/10/2014 1535   CHOLHDL 3.9 09/10/2014 1535   VLDL 21 09/10/2014 1535   LDLCALC 91 09/10/2014 1535      Wt Readings from Last 3 Encounters:  03/11/15 87.544 kg (193 lb)  03/07/15 92.08 kg (203 lb)  03/04/15 91.1 kg (200 lb 13.4 oz)      Other studies Reviewed: Additional studies/ records that were reviewed today include: TEE/TTE., Cath Intraop TEE and OR report    Plan:  MV repair:  Needs post op Echo to document EF and gradients across repair  Intraop TEE no significant residual MR despite complex repair Sinus:  pseudofed and flonase  Seems to have had some irritation from intubation Chol:   Cholesterol is at goal.  Continue current dose of statin and diet Rx.  No myalgias or side effects.  F/U  LFT's in 6 months. Lab Results  Component Value Date   LDLCALC 91 09/10/2014            Echo ordered today F/U with me in 3 months    Signed, Jenkins Rouge, MD  03/11/2015 8:43 AM    [facility] [address], [place], [place]  [address] Phone: [phone]; Fax: [phone]

## 2015-03-07 NOTE — Patient Instructions (Addendum)
Begin taking lasix and potassium and continue to monitor your weight on a daily basis.  Keep your legs elevated whenever you are not up and on your feet.  Continue to gradually increase your physical activity as tolerated.  You should refrain from any heavy lifting or strenuous use of their arms and shoulders until at least 8 weeks from the time of their surgery, and avoid any activities that cause increased pain in their chest on the side of their surgical incision.  Otherwise you may continue to increase their activities without any particular limitations.

## 2015-03-11 ENCOUNTER — Ambulatory Visit (INDEPENDENT_AMBULATORY_CARE_PROVIDER_SITE_OTHER): Payer: BLUE CROSS/BLUE SHIELD | Admitting: Cardiovascular Disease

## 2015-03-11 ENCOUNTER — Telehealth: Payer: Self-pay | Admitting: Cardiovascular Disease

## 2015-03-11 ENCOUNTER — Encounter: Payer: Self-pay | Admitting: Cardiovascular Disease

## 2015-03-11 ENCOUNTER — Ambulatory Visit (INDEPENDENT_AMBULATORY_CARE_PROVIDER_SITE_OTHER): Payer: BLUE CROSS/BLUE SHIELD | Admitting: *Deleted

## 2015-03-11 VITALS — BP 94/68 | HR 83 | Ht 75.5 in | Wt 193.0 lb

## 2015-03-11 DIAGNOSIS — Z9889 Other specified postprocedural states: Secondary | ICD-10-CM

## 2015-03-11 LAB — POCT INR: INR: 2.1

## 2015-03-11 NOTE — Telephone Encounter (Signed)
New Message      Pt calling wanting to know if he can discontinue Lasix due to the fact that he no longer has the swelling in his feet. Please call back and advise.

## 2015-03-11 NOTE — Patient Instructions (Signed)
Medication Instructions:  NO CHANGES Labwork: NONE  Testing/Procedures: Your physician has requested that you have an echocardiogram. Echocardiography is a painless test that uses sound waves to create images of your heart. It provides your doctor with information about the size and shape of your heart and how well your heart's chambers and valves are working. This procedure takes approximately one hour. There are no restrictions for this procedure.    Follow-Up: Your physician recommends that you schedule a follow-up appointment in:  [place]  Any Other Special Instructions Will Be Listed Below (If Applicable).

## 2015-03-11 NOTE — Telephone Encounter (Signed)
WILL FORWARD TO DR NISHAN FOR  REVIEW./CY 

## 2015-03-12 NOTE — Telephone Encounter (Signed)
Take one tablet instead of two for a week then can stop Stop potassium now

## 2015-03-13 NOTE — Telephone Encounter (Signed)
Informed pt that Dr. Johnsie Cancel said it was ok to take one Lasix instead of 2 for a week and then stop and ok to stop K+ now. Pt verbalized understanding and was in agreement with this plan.

## 2015-03-18 ENCOUNTER — Ambulatory Visit (INDEPENDENT_AMBULATORY_CARE_PROVIDER_SITE_OTHER): Payer: BLUE CROSS/BLUE SHIELD

## 2015-03-18 ENCOUNTER — Other Ambulatory Visit: Payer: Self-pay | Admitting: Thoracic Surgery (Cardiothoracic Vascular Surgery)

## 2015-03-18 VITALS — BP 102/64 | HR 78 | Resp 18

## 2015-03-18 DIAGNOSIS — I9589 Other hypotension: Secondary | ICD-10-CM | POA: Diagnosis not present

## 2015-03-18 DIAGNOSIS — Z9889 Other specified postprocedural states: Secondary | ICD-10-CM

## 2015-03-18 LAB — CBC WITH DIFFERENTIAL/PLATELET
Basophils Absolute: 0.1 10*3/uL (ref 0.0–0.1)
Basophils Relative: 1.3 % (ref 0.0–3.0)
Eosinophils Absolute: 0.4 10*3/uL (ref 0.0–0.7)
Eosinophils Relative: 6.2 % — ABNORMAL HIGH (ref 0.0–5.0)
HCT: 34.7 % — ABNORMAL LOW (ref 39.0–52.0)
Hemoglobin: 11.8 g/dL — ABNORMAL LOW (ref 13.0–17.0)
Lymphocytes Relative: 30 % (ref 12.0–46.0)
Lymphs Abs: 1.7 10*3/uL (ref 0.7–4.0)
MCHC: 33.9 g/dL (ref 30.0–36.0)
MCV: 96.2 fl (ref 78.0–100.0)
Monocytes Absolute: 0.6 10*3/uL (ref 0.1–1.0)
Monocytes Relative: 10 % (ref 3.0–12.0)
Neutro Abs: 3 10*3/uL (ref 1.4–7.7)
Neutrophils Relative %: 52.5 % (ref 43.0–77.0)
Platelets: 342 10*3/uL (ref 150.0–400.0)
RBC: 3.61 Mil/uL — ABNORMAL LOW (ref 4.22–5.81)
RDW: 13.1 % (ref 11.5–15.5)
WBC: 5.8 10*3/uL (ref 4.0–10.5)

## 2015-03-18 LAB — BASIC METABOLIC PANEL
BUN: 15 mg/dL (ref 6–23)
CO2: 31 mEq/L (ref 19–32)
Calcium: 9 mg/dL (ref 8.4–10.5)
Chloride: 102 mEq/L (ref 96–112)
Creatinine, Ser: 1.14 mg/dL (ref 0.40–1.50)
GFR: 67.91 mL/min (ref >60.00)
Glucose, Bld: 84 mg/dL (ref 70–99)
Potassium: 4.5 mEq/L (ref 3.5–5.1)
Sodium: 137 mEq/L (ref 135–145)

## 2015-03-18 LAB — POCT INR: INR: 2.6

## 2015-03-18 MED ORDER — FUROSEMIDE 20 MG PO TABS: 20.0000 mg | ORAL_TABLET | Freq: Every day | ORAL | Status: DC

## 2015-03-18 MED ORDER — POTASSIUM CHLORIDE CRYS ER 10 MEQ PO TBCR: 10.0000 meq | EXTENDED_RELEASE_TABLET | Freq: Every day | ORAL | Status: DC

## 2015-03-18 NOTE — Patient Instructions (Signed)
Medication Instructions:  Your physician has recommended you make the following change in your medication:  1- Decrease Potassium 10 mEq by mouth daily. 2- Decrease Lasix 20 mg by mouth daily 3- Stop Metoprolol   Labwork: Your physician recommends that you have labs today. BMET and CBC  Testing/Procedures: NONE  Follow-Up: Your physician recommends that you schedule a follow-up appointment as already scheduled.   Any Other Special Instructions Will Be Listed Below (If Applicable).

## 2015-03-21 ENCOUNTER — Encounter (INDEPENDENT_AMBULATORY_CARE_PROVIDER_SITE_OTHER): Payer: Self-pay

## 2015-03-21 ENCOUNTER — Ambulatory Visit (INDEPENDENT_AMBULATORY_CARE_PROVIDER_SITE_OTHER): Payer: Self-pay | Admitting: Thoracic Surgery (Cardiothoracic Vascular Surgery)

## 2015-03-21 ENCOUNTER — Ambulatory Visit
Admission: RE | Admit: 2015-03-21 | Discharge: 2015-03-21 | Disposition: A | Discharge status: Home / Self Care | Payer: BLUE CROSS/BLUE SHIELD | Source: Ambulatory Visit | Attending: Thoracic Surgery (Cardiothoracic Vascular Surgery) | Admitting: Thoracic Surgery (Cardiothoracic Vascular Surgery)

## 2015-03-21 ENCOUNTER — Encounter: Payer: Self-pay | Admitting: Thoracic Surgery (Cardiothoracic Vascular Surgery)

## 2015-03-21 VITALS — BP 98/64 | HR 83 | Resp 20 | Ht 75.5 in | Wt 194.0 lb

## 2015-03-21 DIAGNOSIS — Z9889 Other specified postprocedural states: Secondary | ICD-10-CM

## 2015-03-21 DIAGNOSIS — J90 Pleural effusion, not elsewhere classified: Secondary | ICD-10-CM | POA: Diagnosis not present

## 2015-03-21 DIAGNOSIS — I340 Nonrheumatic mitral (valve) insufficiency: Secondary | ICD-10-CM

## 2015-03-21 DIAGNOSIS — I341 Nonrheumatic mitral (valve) prolapse: Secondary | ICD-10-CM

## 2015-03-21 DIAGNOSIS — Z0279 Encounter for issue of other medical certificate: Secondary | ICD-10-CM

## 2015-03-21 DIAGNOSIS — J9811 Atelectasis: Secondary | ICD-10-CM | POA: Diagnosis not present

## 2015-03-21 NOTE — Patient Instructions (Signed)
Stop taking amiodarone, lasix and potassium when your current prescriptions run out  The patient may continue to gradually increase their physical activity as tolerated.  They should refrain from any heavy lifting or strenuous use of their arms and shoulders until at least 8 weeks from the time of their surgery, and they should avoid activities that cause increased pain in their chest on the side of their surgical incision.  Otherwise they may continue to increase their activities without any particular limitations.  The patient may return to driving an automobile as long as they are no longer requiring oral narcotic pain relievers during the daytime.  It would be wise to start driving only short distances during the daylight and gradually increase from there as they feel comfortable.  The patient is encouraged to enroll and participate in the outpatient cardiac rehab program beginning as soon as practical.

## 2015-03-21 NOTE — Progress Notes (Signed)
[address]       [place] [address]             [phone]     CARDIOTHORACIC SURGERY OFFICE NOTE  Referring Provider is Josue Hector, MD PCP is Wyatt Haste, MD   HPI:  Patient returns for routine follow-up status post minimally invasive mitral valve repair on 02/27/2015. He was last seen here in our office on 03/07/2015. Since then he has done very well. He was seen in follow-up by Dr. Johnsie Cancel on 03/11/2015. A routine follow-up echocardiogram has been scheduled for next month.  The patient returns to the office today reporting that he feels quite well. He has minimal residual soreness in his chest and is now taking only occasional Tylenol for pain. He is sleeping well at night. He has no shortness of breath. His lower extremity edema has resolved.  He has been somewhat tentative with his return to physical activity, but he is walking on a daily basis. He has not yet started the cardiac rehabilitation program. His Coumadin dose and prothrombin time have been managed through the Coumadin clinic at Dr. Kyla Balzarine office.   Current Outpatient Prescriptions  Medication Sig Dispense Refill  . amiodarone (PACERONE) 200 MG tablet Take 1 tablet (200 mg total) by mouth daily. 30 tablet 1  . amoxicillin (AMOXIL) 500 MG capsule Take 2,000 mg by mouth See admin instructions. Take 4 capsules (2000 mg) one hour prior to appointment    . aspirin EC 81 MG tablet Take 81 mg by mouth daily before supper.    Marland Kitchen atorvastatin (LIPITOR) 10 MG tablet Take 10 mg by mouth daily before supper.    . Eyelid Cleansers (OCUSOFT LID SCRUB EX) Apply topically every morning.    . finasteride (PROSCAR) 5 MG tablet Take 5 mg by mouth daily before supper.    . furosemide (LASIX) 20 MG tablet Take 1 tablet (20 mg total) by mouth daily. 30 tablet 11  . GLUCOSAMINE-CHONDROITIN PO Take 1 tablet by mouth daily. Glucosamine 1500 mg/ chrondroiton 1200 mg    . ketotifen (ZADITOR) 0.025 %  ophthalmic solution Apply 1 drop to eye daily as needed (Itchy Eyes).    . Multiple Vitamins-Minerals (MULTIVITAMIN WITH MINERALS) tablet Take 1 tablet by mouth daily.    . Omega-3 Fatty Acids (FISH OIL) 1200 MG CAPS Take 1 capsule by mouth at bedtime.    Marland Kitchen oxyCODONE (OXY IR/ROXICODONE) 5 MG immediate release tablet Take 1-2 tablets (5-10 mg total) by mouth every 3 (three) hours as needed for severe pain. 30 tablet 0  . pantoprazole (PROTONIX) 40 MG tablet Take 1 tablet (40 mg total) by mouth daily. 30 tablet 0  . polyethylene glycol (MIRALAX) packet Take 17 g by mouth daily as needed. 30 each 0  . potassium chloride (K-DUR,KLOR-CON) 10 MEQ tablet Take 1 tablet (10 mEq total) by mouth daily. 30 tablet 11  . Pseudoephedrine HCl (SUDAFED 24 HOUR PO) Take 1 tablet by mouth daily.    Marland Kitchen warfarin (COUMADIN) 5 MG tablet Take 1 tablet (5 mg total) by mouth daily at 6 PM. 30 tablet 3   No current facility-administered medications for this visit.      Physical Exam:   BP 98/64 mmHg  Pulse 83  Resp 20  Ht 6' 3.5" (1.918 m)  Wt 194 lb (87.998 kg)  BMI 23.92 kg/m2  SpO2 96%  General:  Well-appearing  Chest:   clear  CV:   Regular rate  and rhythm with no murmur  Incisions:  Healing nicely  Abdomen:  Soft and nontender  Extremities:  Warm and well-perfused  Diagnostic Tests:  CHEST 2 VIEW  COMPARISON: PA and lateral chest of March 03, 2015  FINDINGS: The left lung is clear. On the right there is persistent atelectasis of the anterior aspect of the right middle lobe. There is no residual pleural effusion. The retrosternal soft tissues are unremarkable. The heart is normal in size. The mitral valve ring is visible. The pulmonary vascularity is not engorged. The mediastinum is normal in width. The bony thorax is unremarkable.  IMPRESSION: There is persistent right middle lobe atelectasis. There has been interval clearing of left basilar subsegmental atelectasis as well as small  bilateral pleural effusions. There is no CHF.   Electronically Signed  By: David Martinique M.D.  On: 03/21/2015 14:38   Impression:  Patient is doing very well approximately 3 weeks status post minimally invasive mitral valve repair.  Plan:  I have encouraged the patient to continue to gradually increase his physical activity as tolerated. He is ready to start outpatient cardiac rehabilitation program. He may resume driving an automobile. I've instructed the patient to stop taking amiodarone when his current prescription runs out. Similarly, he will stop taking both Lasix and potassium when his current prescription runs out.  He has been instructed to continue to gradually increase his physical activity as tolerated with his primary limitation remaining that he refrain from heavy lifting or strenuous use of his arms or shoulders. He will return for routine follow-up in 2 months. All of his questions of have been addressed.   Valentina Gu. Roxy Manns, MD 03/21/2015 3:32 PM

## 2015-03-23 ENCOUNTER — Other Ambulatory Visit: Payer: Self-pay | Admitting: Family Medicine

## 2015-03-24 ENCOUNTER — Other Ambulatory Visit: Payer: Self-pay | Admitting: Family Medicine

## 2015-03-27 ENCOUNTER — Other Ambulatory Visit: Payer: Self-pay

## 2015-03-27 ENCOUNTER — Ambulatory Visit (HOSPITAL_COMMUNITY): Payer: BLUE CROSS/BLUE SHIELD | Attending: Cardiovascular Disease

## 2015-03-27 ENCOUNTER — Ambulatory Visit (INDEPENDENT_AMBULATORY_CARE_PROVIDER_SITE_OTHER): Payer: BLUE CROSS/BLUE SHIELD | Admitting: *Deleted

## 2015-03-27 DIAGNOSIS — Z9889 Other specified postprocedural states: Secondary | ICD-10-CM

## 2015-03-27 DIAGNOSIS — E785 Hyperlipidemia, unspecified: Secondary | ICD-10-CM | POA: Diagnosis not present

## 2015-03-27 LAB — POCT INR: INR: 3.1

## 2015-04-01 ENCOUNTER — Ambulatory Visit: Payer: BLUE CROSS/BLUE SHIELD | Admitting: Thoracic Surgery (Cardiothoracic Vascular Surgery)

## 2015-04-04 ENCOUNTER — Encounter (HOSPITAL_COMMUNITY)
Admission: RE | Admit: 2015-04-04 | Discharge: 2015-04-04 | Disposition: A | Discharge status: Home / Self Care | Payer: BLUE CROSS/BLUE SHIELD | Source: Ambulatory Visit | Attending: Cardiovascular Disease | Admitting: Cardiovascular Disease

## 2015-04-04 DIAGNOSIS — Z48812 Encounter for surgical aftercare following surgery on the circulatory system: Secondary | ICD-10-CM | POA: Insufficient documentation

## 2015-04-04 NOTE — Progress Notes (Signed)
Cardiac Rehab Medication Review by a Pharmacist  Does the patient  feel that his/her medications are working for him/her?  yes  Has the patient been experiencing any side effects to the medications prescribed?  no   Experienced Low BP a few days but is now under control. 82/50. Minor night sweats   Does the patient measure his/her own blood pressure or blood glucose at home?  no   Does the patient have any problems obtaining medications due to transportation or finances?   no  Understanding of regimen: excellent Understanding of indications: excellent Potential of compliance: excellent    Pharmacist comments: Patient is comfortable with medications and indications.     [place], PharmD Pharmacy Resident  04/04/2015 9:02 AM

## 2015-04-08 ENCOUNTER — Ambulatory Visit (INDEPENDENT_AMBULATORY_CARE_PROVIDER_SITE_OTHER): Payer: BLUE CROSS/BLUE SHIELD | Admitting: *Deleted

## 2015-04-08 DIAGNOSIS — Z9889 Other specified postprocedural states: Secondary | ICD-10-CM

## 2015-04-08 LAB — POCT INR: INR: 2.9

## 2015-04-10 ENCOUNTER — Encounter (HOSPITAL_COMMUNITY)
Admission: RE | Admit: 2015-04-10 | Discharge: 2015-04-10 | Disposition: A | Discharge status: Home / Self Care | Payer: BLUE CROSS/BLUE SHIELD | Source: Ambulatory Visit | Attending: Cardiovascular Disease | Admitting: Cardiovascular Disease

## 2015-04-10 DIAGNOSIS — Z48812 Encounter for surgical aftercare following surgery on the circulatory system: Secondary | ICD-10-CM | POA: Diagnosis not present

## 2015-04-10 NOTE — Progress Notes (Signed)
Pt started cardiac rehab today.  Pt tolerated light exercise without difficulty. VSS, telemetry-Normal Sinus Rhythm, asymptomatic.  Medication list reconciled.  Pt verbalized compliance with medications and denies barriers to compliance. PSYCHOSOCIAL ASSESSMENT:  PHQ-0. Pt exhibits positive coping skills, hopeful outlook with supportive family. No psychosocial needs identified at this time, no psychosocial interventions necessary.    Pt enjoys going to the movies and going for walks.   Pt cardiac rehab  goal is  to lift 20-30 lbs, able to walk 4-5 miles a day and to be able to dance at his sons wedding.  Pt encouraged to participate in exercising on your own to increase ability to achieve these goals.   Pt long term cardiac rehab goal is get back to playing golf..  Pt oriented to exercise equipment and routine.  Understanding verbalized.Andrew Ayala plans to participate in the program for about 3 weeks before he returns to work full time.Will continue to monitor the patient throughout  the program.

## 2015-04-12 ENCOUNTER — Encounter (HOSPITAL_COMMUNITY)
Admission: RE | Admit: 2015-04-12 | Discharge: 2015-04-12 | Disposition: A | Discharge status: Home / Self Care | Payer: BLUE CROSS/BLUE SHIELD | Source: Ambulatory Visit | Attending: Cardiovascular Disease | Admitting: Cardiovascular Disease

## 2015-04-12 DIAGNOSIS — Z48812 Encounter for surgical aftercare following surgery on the circulatory system: Secondary | ICD-10-CM | POA: Diagnosis not present

## 2015-04-12 NOTE — Progress Notes (Signed)
INITIAL PSYCHOSOCIAL ASSESSMENT PHQ=0 Art told that his mother in law passed away in 10-24-2022 of this year and that his sister had a stroke last year and is paralyzed and confined to a wheel chair at a assisted living facility.  Patient has a good support network of his wife, son and friends. Emotional support offered. Will continue to monitor the patient.

## 2015-04-15 ENCOUNTER — Encounter (HOSPITAL_COMMUNITY)
Admission: RE | Admit: 2015-04-15 | Discharge: 2015-04-15 | Disposition: A | Discharge status: Home / Self Care | Payer: BLUE CROSS/BLUE SHIELD | Source: Ambulatory Visit | Attending: Cardiovascular Disease | Admitting: Cardiovascular Disease

## 2015-04-15 ENCOUNTER — Encounter: Payer: Self-pay | Admitting: *Deleted

## 2015-04-15 DIAGNOSIS — Z48812 Encounter for surgical aftercare following surgery on the circulatory system: Secondary | ICD-10-CM | POA: Diagnosis not present

## 2015-04-15 NOTE — Progress Notes (Unsigned)
[employer] RECON Study Informed Consent  Subject Name: Andrew Ayala Subject met inclusion and exclusion criteria. The informed consent form, study requirements and expectations were reviewed with the subject and questions and concerns were addressed prior to the signing of the consent form. The subject verbalized understanding of the trial requirements. The subject agreed to participate in the [employer] RECON Study and signed the informed consent. The informed consent was obtained prior to performance of any protocol-specific procedures for the subject. A copy of the signed informed consent was given to the subject and a copy was placed in the subject's medical record.   Jake Bathe, RN [phone]

## 2015-04-17 ENCOUNTER — Encounter (HOSPITAL_COMMUNITY)
Admission: RE | Admit: 2015-04-17 | Discharge: 2015-04-17 | Disposition: A | Discharge status: Home / Self Care | Payer: BLUE CROSS/BLUE SHIELD | Source: Ambulatory Visit | Attending: Cardiovascular Disease | Admitting: Cardiovascular Disease

## 2015-04-17 DIAGNOSIS — Z48812 Encounter for surgical aftercare following surgery on the circulatory system: Secondary | ICD-10-CM | POA: Diagnosis not present

## 2015-04-19 ENCOUNTER — Encounter (HOSPITAL_COMMUNITY)
Admission: RE | Admit: 2015-04-19 | Discharge: 2015-04-19 | Disposition: A | Discharge status: Home / Self Care | Payer: BLUE CROSS/BLUE SHIELD | Source: Ambulatory Visit | Attending: Cardiovascular Disease | Admitting: Cardiovascular Disease

## 2015-04-19 DIAGNOSIS — Z48812 Encounter for surgical aftercare following surgery on the circulatory system: Secondary | ICD-10-CM | POA: Diagnosis not present

## 2015-04-19 NOTE — Progress Notes (Signed)
Reviewed home exercise with pt today.  Pt plans to walk and use [employer] at home for exercise.  Reviewed THR, pulse, RPE, sign and symptoms,  and when to call 911 or MD.  Pt voiced understanding. Alberteen Sam, MA, ACSM RCEP

## 2015-04-22 ENCOUNTER — Encounter (HOSPITAL_COMMUNITY)
Admission: RE | Admit: 2015-04-22 | Discharge: 2015-04-22 | Disposition: A | Discharge status: Home / Self Care | Payer: BLUE CROSS/BLUE SHIELD | Source: Ambulatory Visit | Attending: Cardiovascular Disease | Admitting: Cardiovascular Disease

## 2015-04-22 DIAGNOSIS — Z48812 Encounter for surgical aftercare following surgery on the circulatory system: Secondary | ICD-10-CM | POA: Diagnosis not present

## 2015-04-24 ENCOUNTER — Encounter (HOSPITAL_COMMUNITY)
Admission: RE | Admit: 2015-04-24 | Discharge: 2015-04-24 | Disposition: A | Discharge status: Home / Self Care | Payer: BLUE CROSS/BLUE SHIELD | Source: Ambulatory Visit | Attending: Cardiovascular Disease | Admitting: Cardiovascular Disease

## 2015-04-24 ENCOUNTER — Ambulatory Visit (INDEPENDENT_AMBULATORY_CARE_PROVIDER_SITE_OTHER): Payer: BLUE CROSS/BLUE SHIELD | Admitting: *Deleted

## 2015-04-24 DIAGNOSIS — Z9889 Other specified postprocedural states: Secondary | ICD-10-CM

## 2015-04-24 DIAGNOSIS — Z48812 Encounter for surgical aftercare following surgery on the circulatory system: Secondary | ICD-10-CM | POA: Diagnosis not present

## 2015-04-24 LAB — POCT INR: INR: 1.6

## 2015-04-26 ENCOUNTER — Encounter (HOSPITAL_COMMUNITY)
Admission: RE | Admit: 2015-04-26 | Discharge: 2015-04-26 | Disposition: A | Discharge status: Home / Self Care | Payer: BLUE CROSS/BLUE SHIELD | Source: Ambulatory Visit | Attending: Cardiovascular Disease | Admitting: Cardiovascular Disease

## 2015-04-26 DIAGNOSIS — Z48812 Encounter for surgical aftercare following surgery on the circulatory system: Secondary | ICD-10-CM | POA: Diagnosis not present

## 2015-04-27 ENCOUNTER — Other Ambulatory Visit: Payer: Self-pay | Admitting: Physician Assistant

## 2015-04-29 ENCOUNTER — Encounter (HOSPITAL_COMMUNITY)
Admission: RE | Admit: 2015-04-29 | Discharge: 2015-04-29 | Disposition: A | Discharge status: Home / Self Care | Payer: BLUE CROSS/BLUE SHIELD | Source: Ambulatory Visit | Attending: Cardiovascular Disease | Admitting: Cardiovascular Disease

## 2015-04-29 DIAGNOSIS — Z48812 Encounter for surgical aftercare following surgery on the circulatory system: Secondary | ICD-10-CM | POA: Diagnosis not present

## 2015-05-01 ENCOUNTER — Encounter (HOSPITAL_COMMUNITY)
Admission: RE | Admit: 2015-05-01 | Discharge: 2015-05-01 | Disposition: A | Discharge status: Home / Self Care | Payer: BLUE CROSS/BLUE SHIELD | Source: Ambulatory Visit | Attending: Cardiovascular Disease | Admitting: Cardiovascular Disease

## 2015-05-01 DIAGNOSIS — Z48812 Encounter for surgical aftercare following surgery on the circulatory system: Secondary | ICD-10-CM | POA: Diagnosis not present

## 2015-05-03 ENCOUNTER — Encounter (HOSPITAL_COMMUNITY)
Admission: RE | Admit: 2015-05-03 | Discharge: 2015-05-03 | Disposition: A | Discharge status: Home / Self Care | Payer: BLUE CROSS/BLUE SHIELD | Source: Ambulatory Visit | Attending: Cardiovascular Disease | Admitting: Cardiovascular Disease

## 2015-05-03 DIAGNOSIS — Z48812 Encounter for surgical aftercare following surgery on the circulatory system: Secondary | ICD-10-CM | POA: Diagnosis not present

## 2015-05-06 ENCOUNTER — Encounter (HOSPITAL_COMMUNITY)
Admission: RE | Admit: 2015-05-06 | Discharge: 2015-05-06 | Disposition: A | Discharge status: Home / Self Care | Payer: BLUE CROSS/BLUE SHIELD | Source: Ambulatory Visit | Attending: Cardiovascular Disease | Admitting: Cardiovascular Disease

## 2015-05-06 DIAGNOSIS — Z48812 Encounter for surgical aftercare following surgery on the circulatory system: Secondary | ICD-10-CM | POA: Diagnosis not present

## 2015-05-08 ENCOUNTER — Encounter (HOSPITAL_COMMUNITY)
Admission: RE | Admit: 2015-05-08 | Discharge: 2015-05-08 | Disposition: A | Discharge status: Home / Self Care | Payer: BLUE CROSS/BLUE SHIELD | Source: Ambulatory Visit | Attending: Cardiovascular Disease | Admitting: Cardiovascular Disease

## 2015-05-08 ENCOUNTER — Ambulatory Visit (INDEPENDENT_AMBULATORY_CARE_PROVIDER_SITE_OTHER): Payer: BLUE CROSS/BLUE SHIELD | Admitting: *Deleted

## 2015-05-08 DIAGNOSIS — Z9889 Other specified postprocedural states: Secondary | ICD-10-CM | POA: Diagnosis not present

## 2015-05-08 DIAGNOSIS — Z48812 Encounter for surgical aftercare following surgery on the circulatory system: Secondary | ICD-10-CM | POA: Diagnosis not present

## 2015-05-08 LAB — POCT INR: INR: 1.5

## 2015-05-10 ENCOUNTER — Encounter (HOSPITAL_COMMUNITY)
Admission: RE | Admit: 2015-05-10 | Discharge: 2015-05-10 | Disposition: A | Discharge status: Home / Self Care | Payer: BLUE CROSS/BLUE SHIELD | Source: Ambulatory Visit | Attending: Cardiovascular Disease | Admitting: Cardiovascular Disease

## 2015-05-10 DIAGNOSIS — Z48812 Encounter for surgical aftercare following surgery on the circulatory system: Secondary | ICD-10-CM | POA: Diagnosis not present

## 2015-05-13 ENCOUNTER — Telehealth: Payer: Self-pay | Admitting: Cardiovascular Disease

## 2015-05-13 ENCOUNTER — Encounter (HOSPITAL_COMMUNITY)
Admission: RE | Admit: 2015-05-13 | Discharge: 2015-05-13 | Disposition: A | Discharge status: Home / Self Care | Payer: BLUE CROSS/BLUE SHIELD | Source: Ambulatory Visit | Attending: Cardiovascular Disease | Admitting: Cardiovascular Disease

## 2015-05-13 DIAGNOSIS — Z48812 Encounter for surgical aftercare following surgery on the circulatory system: Secondary | ICD-10-CM | POA: Diagnosis not present

## 2015-05-13 NOTE — Telephone Encounter (Signed)
New message      Pt has been in cardiac rehab for approx 6 weeks.  He want to know how long does Dr Johnsie Cancel recommend he attend cardiac rehab?

## 2015-05-13 NOTE — Progress Notes (Signed)
Andrew Ayala 69 y.o. male Nutrition Note Spoke with pt.  Nutrition Survey reviewed with pt. Pt is working towards following the Therapeutic Lifestyle Changes diet. Ways to incorporate healthy eating habits into daily living discussed. Pt is on Coumadin. Pt states "I'm not sure how to have Consistent Vitamin K intake." Per discussion, pt appears to have been avoiding foods with vitamin K. Consistent Vitamin K intake while on Coumadin discussed. Pt expressed understanding of the information reviewed. Pt aware of nutrition education classes offered. Lab Results  Component Value Date   HGBA1C 5.5 02/25/2015   Wt Readings from Last 3 Encounters:  04/04/15 198 lb 3.1 oz (89.9 kg)  03/21/15 194 lb (87.998 kg)  03/11/15 193 lb (87.544 kg)   Nutrition Diagnosis ? Food-and nutrition-related knowledge deficit related to lack of exposure to information as related to diagnosis of: ? CVD   Nutrition Intervention ? Benefits of adopting Therapeutic Lifestyle Changes discussed when Medficts reviewed. ? Pt to attend the Portion Distortion class ? Pt to attend the  ? Nutrition I class - met; 04/23/15            ? Nutrition II class - met; 04/30/15 ? Pt given handouts for: ? Consistent vit K diet ? Continue client-centered nutrition education by RD, as part of interdisciplinary care.  Goal(s) ? Pt able to name foods rich in vitamin K. (Pt taking Coumadin/Warfarin). ? Pt to identify and limit food sources of saturated fat, trans fat, and cholesterol  Monitor and Evaluate progress toward nutrition goal with team.  Derek Mound, M.Ed, RD, LDN, CDE 05/13/2015 3:54 PM

## 2015-05-13 NOTE — Telephone Encounter (Signed)
LM TO CALL BACK ./CY 

## 2015-05-15 ENCOUNTER — Encounter (HOSPITAL_COMMUNITY)
Admission: RE | Admit: 2015-05-15 | Discharge: 2015-05-15 | Disposition: A | Discharge status: Home / Self Care | Payer: BLUE CROSS/BLUE SHIELD | Source: Ambulatory Visit | Attending: Cardiovascular Disease | Admitting: Cardiovascular Disease

## 2015-05-15 DIAGNOSIS — Z48812 Encounter for surgical aftercare following surgery on the circulatory system: Secondary | ICD-10-CM | POA: Diagnosis not present

## 2015-05-15 NOTE — Telephone Encounter (Signed)
PT  AWARE  THAT  PHASES  ARE  12 WEEKS  WITH  3  SESSIONS PER  WEEK AT  A TIME . PT NOT  SURE  IF  WILL COMPLETE  DUE TO  COST  MAY  CHECK ON PARTICIPATING IN SILVER SNEAKERS WILL FORWARD TO  DR  Johnsie Cancel FOR  REVIEW  .Adonis Housekeeper

## 2015-05-17 ENCOUNTER — Encounter (HOSPITAL_COMMUNITY)
Admission: RE | Admit: 2015-05-17 | Discharge: 2015-05-17 | Disposition: A | Discharge status: Home / Self Care | Payer: BLUE CROSS/BLUE SHIELD | Source: Ambulatory Visit | Attending: Cardiovascular Disease | Admitting: Cardiovascular Disease

## 2015-05-17 DIAGNOSIS — Z48812 Encounter for surgical aftercare following surgery on the circulatory system: Secondary | ICD-10-CM | POA: Diagnosis not present

## 2015-05-20 ENCOUNTER — Encounter (HOSPITAL_COMMUNITY)
Admission: RE | Admit: 2015-05-20 | Discharge: 2015-05-20 | Disposition: A | Discharge status: Home / Self Care | Payer: BLUE CROSS/BLUE SHIELD | Source: Ambulatory Visit | Attending: Cardiovascular Disease | Admitting: Cardiovascular Disease

## 2015-05-20 DIAGNOSIS — Z48812 Encounter for surgical aftercare following surgery on the circulatory system: Secondary | ICD-10-CM | POA: Diagnosis not present

## 2015-05-22 ENCOUNTER — Ambulatory Visit (INDEPENDENT_AMBULATORY_CARE_PROVIDER_SITE_OTHER): Payer: BLUE CROSS/BLUE SHIELD | Admitting: *Deleted

## 2015-05-22 ENCOUNTER — Encounter (HOSPITAL_COMMUNITY)
Admission: RE | Admit: 2015-05-22 | Discharge: 2015-05-22 | Disposition: A | Discharge status: Home / Self Care | Payer: BLUE CROSS/BLUE SHIELD | Source: Ambulatory Visit | Attending: Cardiovascular Disease | Admitting: Cardiovascular Disease

## 2015-05-22 DIAGNOSIS — Z9889 Other specified postprocedural states: Secondary | ICD-10-CM | POA: Diagnosis not present

## 2015-05-22 DIAGNOSIS — Z48812 Encounter for surgical aftercare following surgery on the circulatory system: Secondary | ICD-10-CM | POA: Diagnosis not present

## 2015-05-22 LAB — POCT INR: INR: 1.5

## 2015-05-24 ENCOUNTER — Encounter (HOSPITAL_COMMUNITY)
Admission: RE | Admit: 2015-05-24 | Discharge: 2015-05-24 | Disposition: A | Discharge status: Home / Self Care | Payer: BLUE CROSS/BLUE SHIELD | Source: Ambulatory Visit | Attending: Cardiovascular Disease | Admitting: Cardiovascular Disease

## 2015-05-24 DIAGNOSIS — Z48812 Encounter for surgical aftercare following surgery on the circulatory system: Secondary | ICD-10-CM | POA: Insufficient documentation

## 2015-05-29 ENCOUNTER — Encounter (HOSPITAL_COMMUNITY)
Admission: RE | Admit: 2015-05-29 | Discharge: 2015-05-29 | Disposition: A | Discharge status: Home / Self Care | Payer: BLUE CROSS/BLUE SHIELD | Source: Ambulatory Visit | Attending: Cardiovascular Disease | Admitting: Cardiovascular Disease

## 2015-05-29 DIAGNOSIS — Z48812 Encounter for surgical aftercare following surgery on the circulatory system: Secondary | ICD-10-CM | POA: Diagnosis not present

## 2015-05-31 ENCOUNTER — Telehealth: Payer: Self-pay | Admitting: Cardiovascular Disease

## 2015-05-31 ENCOUNTER — Encounter (HOSPITAL_COMMUNITY)
Admission: RE | Admit: 2015-05-31 | Discharge: 2015-05-31 | Disposition: A | Discharge status: Home / Self Care | Payer: BLUE CROSS/BLUE SHIELD | Source: Ambulatory Visit | Attending: Cardiovascular Disease | Admitting: Cardiovascular Disease

## 2015-05-31 DIAGNOSIS — Z48812 Encounter for surgical aftercare following surgery on the circulatory system: Secondary | ICD-10-CM | POA: Diagnosis not present

## 2015-05-31 NOTE — Telephone Encounter (Signed)
ERROR

## 2015-06-03 ENCOUNTER — Encounter: Payer: Self-pay | Admitting: Thoracic Surgery (Cardiothoracic Vascular Surgery)

## 2015-06-03 ENCOUNTER — Encounter (HOSPITAL_COMMUNITY)
Admission: RE | Admit: 2015-06-03 | Discharge: 2015-06-03 | Disposition: A | Discharge status: Home / Self Care | Payer: BLUE CROSS/BLUE SHIELD | Source: Ambulatory Visit | Attending: Cardiovascular Disease | Admitting: Cardiovascular Disease

## 2015-06-03 ENCOUNTER — Ambulatory Visit (INDEPENDENT_AMBULATORY_CARE_PROVIDER_SITE_OTHER): Payer: BLUE CROSS/BLUE SHIELD | Admitting: Thoracic Surgery (Cardiothoracic Vascular Surgery)

## 2015-06-03 ENCOUNTER — Other Ambulatory Visit: Payer: Self-pay | Admitting: Family Medicine

## 2015-06-03 VITALS — BP 104/65 | HR 73 | Resp 20 | Ht 75.0 in | Wt 198.0 lb

## 2015-06-03 DIAGNOSIS — Z9889 Other specified postprocedural states: Secondary | ICD-10-CM

## 2015-06-03 DIAGNOSIS — I340 Nonrheumatic mitral (valve) insufficiency: Secondary | ICD-10-CM | POA: Diagnosis not present

## 2015-06-03 DIAGNOSIS — Z48812 Encounter for surgical aftercare following surgery on the circulatory system: Secondary | ICD-10-CM | POA: Diagnosis not present

## 2015-06-03 NOTE — Patient Instructions (Addendum)
Stop taking Coumadin.  You may resume unrestricted physical activity without any particular limitations at this time.  Endocarditis is a potentially serious infection of heart valves or inside lining of the heart.  It occurs more commonly in patients with diseased heart valves (such as patient's with aortic or mitral valve disease) and in patients who have undergone heart valve repair or replacement.  Certain surgical and dental procedures may put you at risk, such as dental cleaning, other dental procedures, or any surgery involving the respiratory, urinary, gastrointestinal tract, gallbladder or prostate gland.   To minimize your chances for develooping endocarditis, maintain good oral health and seek prompt medical attention for any infections involving the mouth, teeth, gums, skin or urinary tract.    Always notify your doctor or dentist about your underlying heart valve condition before having any invasive procedures. You will need to take antibiotics before certain procedures, including all routine dental cleanings or other dental procedures.  Your cardiologist or dentist should prescribe these antibiotics for you to be taken ahead of time.

## 2015-06-03 NOTE — Progress Notes (Signed)
[address]       [place] [address]             [phone]     CARDIOTHORACIC SURGERY OFFICE NOTE  Referring Provider is Josue Hector, MD PCP is Wyatt Haste, MD   HPI:  Patient returns for routine follow-up approximately 3 months status post minimally invasive mitral valve repair on 02/27/2015. He was last seen here in our office on 03/21/2015. Since then he has done exceptionally well. Routine follow-up transthoracic echocardiogram performed 03/27/2015 demonstrated intact mitral valve repair with no residual mitral regurgitation and normal left ventricular systolic function. The patient has almost completed the outpatient cardiac rehabilitation program and he returns to our office for routine follow-up today. He reports that he feels exceptionally well. He has no residual pain whatsoever. He has no shortness of breath. Exercise tolerance is quite good. He has not had any palpitations or dizzy spells. The remainder of his review of systems is entirely unremarkable.   Current Outpatient Prescriptions  Medication Sig Dispense Refill  . amoxicillin (AMOXIL) 500 MG capsule Take 2,000 mg by mouth See admin instructions. Take 4 capsules (2000 mg) one hour prior to appointment    . aspirin EC 81 MG tablet Take 81 mg by mouth daily before supper.    Marland Kitchen atorvastatin (LIPITOR) 10 MG tablet TAKE 1 TABLET BY MOUTH ONCE DAILY 30 tablet 5  . Eyelid Cleansers (OCUSOFT LID SCRUB EX) Apply topically 2 (two) times daily.     . finasteride (PROSCAR) 5 MG tablet TAKE 1 TABLET BY MOUTH ONCE DAILY 30 tablet 5  . fluticasone (VERAMYST) 27.5 MCG/SPRAY nasal spray Place 2 sprays into the nose daily as needed for rhinitis.    Marland Kitchen GLUCOSAMINE-CHONDROITIN PO Take 1 tablet by mouth daily. Glucosamine 1500 mg/ chrondroiton 1200 mg    . ketotifen (ZADITOR) 0.025 % ophthalmic solution Apply 1 drop to eye daily as needed (Itchy Eyes).    . Multiple Vitamins-Minerals (MULTIVITAMIN WITH  MINERALS) tablet Take 1 tablet by mouth daily.    . Omega-3 Fatty Acids (FISH OIL) 1200 MG CAPS Take 1 capsule by mouth at bedtime.    . Pseudoephedrine HCl (SUDAFED 24 HOUR PO) Take 1 tablet by mouth daily as needed.      No current facility-administered medications for this visit.      Physical Exam:   BP 104/65 mmHg  Pulse 73  Resp 20  Ht 6\' 3"  (1.905 m)  Wt 198 lb (89.812 kg)  BMI 24.75 kg/m2  SpO2 98%  General:  Well-appearing  Chest:   clear  CV:   Regular rate and rhythm without murmur  Incisions:  Completely healed  Abdomen:  Soft and nontender  Extremities:  Warm and well-perfused  Diagnostic Tests:  Transthoracic Echocardiography  Patient:  Andrew Ayala, Andrew Ayala MR #:    [id number] Study Date: 03/27/2015 Gender:   M Age:    69 Height:   190.5 cm Weight:   88 kg BSA:    2.16 m^2 Pt. Status: Room:  ATTENDING  Jenkins Rouge, M.D. ORDERING   Jenkins Rouge, M.D. REFERRING  Jenkins Rouge, M.D. SONOGRAPHER Oletta Lamas, Will PERFORMING  [facility], Outpatient  cc:  ------------------------------------------------------------------- LV EF: 50% -  55%  ------------------------------------------------------------------- Indications:   (Z98.89).  ------------------------------------------------------------------- History:  PMH: S/p MV repair. Acquired from the patient and from the patient&'s chart. Risk factors: Dyslipidemia.  ------------------------------------------------------------------- Study Conclusions  - Left ventricle: Distal septal and apical hypokinesis. The cavity size was  normal. Systolic function was normal. The estimated ejection fraction was in the range of 50% to 55%. Wall motion was normal; there were no regional wall motion abnormalities. - Mitral valve: S/P complex mitral valve repair Normal diastolic gradients and no significant residual MR. - Atrial septum: No defect or patent foramen ovale was  identified.  Transthoracic echocardiography. M-mode, complete 2D, spectral Doppler, and color Doppler. Birthdate: Patient birthdate: 1946/07/26. Age: Patient is 69 yr old. Sex: Gender: male. BMI: 24.2 kg/m^2. Blood pressure:   94/68 Patient status: Outpatient. Study date: Study date: 03/27/2015. Study time: 01:51 PM. Location: [facility]  -------------------------------------------------------------------  ------------------------------------------------------------------- Left ventricle: Distal septal and apical hypokinesis. The cavity size was normal. Systolic function was normal. The estimated ejection fraction was in the range of 50% to 55%. Wall motion was normal; there were no regional wall motion abnormalities.  ------------------------------------------------------------------- Aortic valve:  Trileaflet; normal thickness leaflets. Mobility was not restricted. Doppler: Transvalvular velocity was within the normal range. There was no stenosis. There was no regurgitation.  ------------------------------------------------------------------- Aorta: Aortic root: The aortic root was normal in size.  ------------------------------------------------------------------- Mitral valve: S/P complex mitral valve repair Normal diastolic gradients and no significant residual MR. Structurally normal valve.  Mobility was not restricted. Doppler: Transvalvular velocity was within the normal range. There was no evidence for stenosis. There was no regurgitation.  Peak gradient (D): 6 mm Hg.  ------------------------------------------------------------------- Left atrium: The atrium was normal in size.  ------------------------------------------------------------------- Atrial septum: No defect or patent foramen ovale was identified.  ------------------------------------------------------------------- Right ventricle: The cavity size was normal. Wall  thickness was normal. Systolic function was normal.  ------------------------------------------------------------------- Pulmonic valve:  Doppler: Transvalvular velocity was within the normal range. There was no evidence for stenosis.  ------------------------------------------------------------------- Tricuspid valve:  Structurally normal valve.  Doppler: Transvalvular velocity was within the normal range. There was mild regurgitation.  ------------------------------------------------------------------- Pulmonary artery:  The main pulmonary artery was normal-sized. Systolic pressure was within the normal range.  ------------------------------------------------------------------- Right atrium: The atrium was normal in size.  ------------------------------------------------------------------- Pericardium: The pericardium was normal in appearance. There was no pericardial effusion.  ------------------------------------------------------------------- Systemic veins: Inferior vena cava: The vessel was normal in size.  ------------------------------------------------------------------- Measurements  Left ventricle             Value    Reference LV ID, ED, PLAX chordal        45.8 mm   43 - 52 LV ID, ES, PLAX chordal        34.1 mm   23 - 38 LV fx shortening, PLAX chordal (L)   26  %   >=29 LV PW thickness, ED          10  mm   --------- IVS/LV PW ratio, ED          1.11     <=1.3 Stroke volume, 2D           87  ml   --------- Stroke volume/bsa, 2D         40  ml/m^2 --------- LV e&', lateral             10.4 cm/s  --------- LV E/e&', lateral            11.63    --------- LV e&', medial             5.36 cm/s  --------- LV E/e&', medial            22.57    --------- LV e&', average  7.88 cm/s  --------- LV E/e&', average            15.36    ---------  Ventricular septum           Value    Reference IVS thickness, ED           11.1 mm   ---------  LVOT                  Value    Reference LVOT ID, S               23  mm   --------- LVOT area               4.15 cm^2  --------- LVOT ID                23  mm   --------- LVOT peak velocity, S         105  cm/s  --------- LVOT mean velocity, S         70.7 cm/s  --------- LVOT VTI, S              20.9 cm   --------- LVOT peak gradient, S         4   mm Hg --------- Stroke volume (SV), LVOT DP      86.8 ml   --------- Stroke index (SV/bsa), LVOT DP     40.2 ml/m^2 ---------  Aorta                 Value    Reference Aortic root ID, ED           40  mm   --------- Ascending aorta ID, A-P, S       36  mm   ---------  Left atrium              Value    Reference LA ID, A-P, ES             32  mm   --------- LA ID/bsa, A-P             1.48 cm/m^2 <=2.2 LA volume, S              54  ml   --------- LA volume/bsa, S            25  ml/m^2 --------- LA volume, ES, 1-p A4C         60  ml   --------- LA volume/bsa, ES, 1-p A4C       27.8 ml/m^2 --------- LA volume, ES, 1-p A2C         47  ml   --------- LA volume/bsa, ES, 1-p A2C       21.8 ml/m^2 ---------  Mitral valve              Value    Reference Mitral E-wave peak velocity      121  cm/s  --------- Mitral A-wave peak velocity      121  cm/s  --------- Mitral deceleration time    (H)   394  ms   150 - 230 Mitral peak gradient, D        6   mm Hg  --------- Mitral E/A ratio, peak         1      ---------  Pulmonary arteries           Value    Reference PA pressure, S, DP  18  mm Hg <=30  Tricuspid valve            Value    Reference Tricuspid regurg peak velocity     194  cm/s  --------- Tricuspid peak RV-RA gradient     15  mm Hg ---------  Systemic veins             Value    Reference Estimated CVP             3   mm Hg ---------  Right ventricle            Value    Reference RV pressure, S, DP           18  mm Hg <=30 RV s&', lateral, S           9.36 cm/s  ---------  Legend: (L) and (H) mark values outside specified reference range.  ------------------------------------------------------------------- Prepared and Electronically Authenticated by  Jenkins Rouge, M.D. 2016-07-06T15:58:47   Impression:  Patient is doing very well approximately 3 months following minimally invasive mitral valve repair. I have personally reviewed his follow-up echocardiogram which looks good with intact repair and no residual regurgitation.   Plan:  I have instructed the patient to stop taking Coumadin. He will continue on low-dose aspirin indefinitely. He has been reminded regarding the need for anti-biotic prophylaxis for all dental cleanings and related procedures. He has been released to unrestricted physical activity.  I spent in excess of 15 minutes during the conduct of this office consultation and >50% of this time involved direct face-to-face encounter with the patient for counseling and/or coordination of their care.   Valentina Gu. Roxy Manns, MD 06/03/2015 4:53 PM

## 2015-06-04 NOTE — Telephone Encounter (Signed)
Is this okay?

## 2015-06-05 ENCOUNTER — Encounter (HOSPITAL_COMMUNITY)
Admission: RE | Admit: 2015-06-05 | Discharge: 2015-06-05 | Disposition: A | Discharge status: Home / Self Care | Payer: BLUE CROSS/BLUE SHIELD | Source: Ambulatory Visit | Attending: Cardiovascular Disease | Admitting: Cardiovascular Disease

## 2015-06-05 DIAGNOSIS — Z48812 Encounter for surgical aftercare following surgery on the circulatory system: Secondary | ICD-10-CM | POA: Diagnosis not present

## 2015-06-05 NOTE — Progress Notes (Signed)
Art has stopped taking his warfarin per Dr Roxy Manns.

## 2015-06-07 ENCOUNTER — Encounter (HOSPITAL_COMMUNITY): Payer: BLUE CROSS/BLUE SHIELD

## 2015-06-07 NOTE — Progress Notes (Signed)
Patient ID: Andrew Ayala, male   DOB: 07-26-1946, 69 y.o.   MRN: [id number]    Cardiology Office Note   Date:  06/07/2015   ID:  Andrew Ayala, DOB 1946/02/17, MRN [id number]  PCP:  Wyatt Haste, MD  Cardiologist:   Jenkins Rouge, MD   No chief complaint on file.     History of Present Illness: Andrew Ayala is a 69 y.o. male who presents for evaluation of MR.  Long standing history of MVP  Seen by [facility] about 12 years ago and told he was fine.  Despite murmur no f/u echo done since then.  Still working in [employer] but is otherwise sedentary.  No chest pain dyspnea palpitations or syncope CRF;s elevated lipids on statin No hisotry of rheumatic fever, connective tissue disease or CAD.  Had TTE done Echo done 09/12/14 reviewed   Study Conclusions  - Left ventricle: The cavity size was normal. Wall thickness was normal. Systolic function was normal. The estimated ejection fraction was in the range of 55% to 60%. - Mitral valve: Bileaflet prolapse worse in the posterior leaflet with more anteriorly directed MR. Degree of MR varies from parasternal to apical views but overall does appear severe Consider TEE to further evaluate since valve would be repairable - Left atrium: The atrium was mildly dilated. - Atrial septum: No defect or patent foramen ovale was identified.  F/U TEE done by me on 1/13 showed no flail but severe prolapse of middle scallop of P2 with PISA 1.3 and RV of 51cc No CHF, chest pain  Occasional palpitations and PAC;s noted during TEE No history of TIA/CVA  Cath 02/08/15:  Reviewed:  Normal coronary arteries normal EF no right heart done  02/27/15 Surgical repair Roxy Manns:  Minimally-Invasive Mitral Valve Repair Complex valvuloplasty including triangular resection of posterior leaflet Artificial Gore-tex neochord placement x8 Sorin Memo 3D Ring Annuloplasty (size 77mm, catalog # [id number], serial #  [id number])  03/27/15  :  Post op echo reviewed  EF 50-55% no residual MR  Study Conclusions  - Left ventricle: Distal septal and apical hypokinesis. The cavity size was normal. Systolic function was normal. The estimated ejection fraction was in the range of 50% to 55%. Wall motion was normal; there were no regional wall motion abnormalities. - Mitral valve: S/P complex mitral valve repair Normal diastolic gradients and no significant residual MR. - Atrial septum: No defect or patent foramen ovale was identified.  Past Medical History  Diagnosis Date  . Sensory - neural hearing loss   . Vasomotor rhinitis   . Allergic rhinitis   . MVP (mitral valve prolapse)   . BPH (benign prostatic hyperplasia)   . Hyperlipidemia   . Severe mitral regurgitation 10/05/2014  . Heart murmur   . Acne rosacea   . S/P minimally invasive mitral valve repair 02/27/2015    Complex valvuloplasty including triangular resection of flail segment of posterior leaflet, artificial Gore-tex neochord placement x8 and 34 mm Sorin Memo 3D Rechord ring annuloplasty    Past Surgical History  Procedure Laterality Date  . Colonoscopy  2007    Dr. Henrene Pastor  . Hand surgery  2001    right  . Wart removal  1997    cryo surgery (penis)  . Wisdom tooth extraction  1994  . Septoplasty  1998  . Undescended testicle  1951  . Tee without cardioversion N/A 10/03/2014    Procedure: TRANSESOPHAGEAL ECHOCARDIOGRAM (TEE);  Surgeon: Josue Hector, MD;  Location: [facility];  Service:  Cardiovascular;  Laterality: N/A;  . Cardiac catheterization N/A 02/08/2015    Procedure: Left Heart Cath And Coronary Angiography;  Surgeon: Josue Hector, MD;  Location: [facility];  Service: Cardiovascular;  Laterality: N/A;  . Mitral valve repair Right 02/27/2015    Procedure: MINIMALLY INVASIVE MITRAL VALVE REPAIR (MVR);  Surgeon: Rexene Alberts, MD;  Location: [facility];  Service: Open Heart Surgery;  Laterality: Right;  . Tee without  cardioversion N/A 02/27/2015    Procedure: TRANSESOPHAGEAL ECHOCARDIOGRAM (TEE);  Surgeon: Rexene Alberts, MD;  Location: [facility];  Service: Open Heart Surgery;  Laterality: N/A;     Current Outpatient Prescriptions  Medication Sig Dispense Refill  . amoxicillin (AMOXIL) 500 MG capsule Take 2,000 mg by mouth See admin instructions. Take 4 capsules (2000 mg) one hour prior to appointment    . aspirin EC 81 MG tablet Take 81 mg by mouth daily before supper.    Marland Kitchen atorvastatin (LIPITOR) 10 MG tablet TAKE 1 TABLET BY MOUTH ONCE DAILY 30 tablet 5  . Eyelid Cleansers (OCUSOFT LID SCRUB EX) Apply topically 2 (two) times daily.     . finasteride (PROSCAR) 5 MG tablet TAKE 1 TABLET BY MOUTH ONCE DAILY 30 tablet 5  . fluticasone (VERAMYST) 27.5 MCG/SPRAY nasal spray Place 2 sprays into the nose daily as needed for rhinitis.    Marland Kitchen GLUCOSAMINE-CHONDROITIN PO Take 1 tablet by mouth daily. Glucosamine 1500 mg/ chrondroiton 1200 mg    . ketotifen (ZADITOR) 0.025 % ophthalmic solution Apply 1 drop to eye daily as needed (Itchy Eyes).    Marland Kitchen LEVITRA 10 MG tablet TAKE 1 TABLET (10 MG TOTAL) BY MOUTH DAILY AS NEEDED FOR ERECTILE DYSFUNCTION. 10 tablet 3  . Multiple Vitamins-Minerals (MULTIVITAMIN WITH MINERALS) tablet Take 1 tablet by mouth daily.    . Omega-3 Fatty Acids (FISH OIL) 1200 MG CAPS Take 1 capsule by mouth at bedtime.    . Pseudoephedrine HCl (SUDAFED 24 HOUR PO) Take 1 tablet by mouth daily as needed.      No current facility-administered medications for this visit.    Allergies:   Review of patient's allergies indicates no known allergies.    Social History:  The patient  reports that he has never smoked. He has never used smokeless tobacco. He reports that he drinks alcohol. He reports that he does not use illicit drugs.   Family History:  The patient's family history includes Colon cancer (age of onset: 25) in his paternal uncle; Colon cancer (age of onset: 81) in his paternal uncle; Heart attack  in his father; Heart failure in his mother; Stroke in his sister. There is no history of Stomach cancer.    ROS:  Please see the history of present illness.   Otherwise, review of systems are positive for none.   All other systems are reviewed and negative.    PHYSICAL EXAM: VS:  There were no vitals taken for this visit. , BMI There is no weight on file to calculate BMI. GEN: Well nourished, well developed, in no acute distress HEENT: normal Neck: no JVD, carotid bruits, or masses Cardiac: RRR; no   murmurs, rubs, or gallops,no edema  Minimally invasive scar over right aereolar area Respiratory:  clear to auscultation bilaterally, normal work of breathing GI: soft, nontender, nondistended, + BS MS: no deformity or atrophy Skin: warm and dry, no rash Neuro:  Strength and sensation are intact Psych: euthymic mood, full affect   EKG:   10/05/14  EKG  SR rate 56 normal     Recent Labs: 02/25/2015: ALT 21 02/28/2015: Magnesium 2.2 03/18/2015: BUN 15; Creatinine, Ser 1.14; Hemoglobin 11.8*; Platelets 342.0; Potassium 4.5; Sodium 137    Lipid Panel    Component Value Date/Time   CHOL 151 09/10/2014 1535   TRIG 105 09/10/2014 1535   HDL 39* 09/10/2014 1535   CHOLHDL 3.9 09/10/2014 1535   VLDL 21 09/10/2014 1535   LDLCALC 91 09/10/2014 1535      Wt Readings from Last 3 Encounters:  06/03/15 89.812 kg (198 lb)  04/04/15 89.9 kg (198 lb 3.1 oz)  03/21/15 87.998 kg (194 lb)      Other studies Reviewed: Additional studies/ records that were reviewed today include: TEE/TTE., Cath Intraop TEE and OR report    Plan:  MV repair:   Intact no residual leak EF 50-55% post op SBE discussed Sinus:  pseudofed and flonase  Seems to have had some irritation from intubation Chol:  Cholesterol is at goal.  Continue current dose of statin and diet Rx.  No myalgias or side effects.  F/U  LFT's in 6 months. Lab Results  Component Value Date   LDLCALC 91 09/10/2014              Signed, Jenkins Rouge, MD  06/07/2015 4:57 PM    [address], [place], [place]  [address] Phone: [phone]; Fax: [phone]

## 2015-06-10 ENCOUNTER — Encounter: Payer: Self-pay | Admitting: Cardiovascular Disease

## 2015-06-10 ENCOUNTER — Encounter (HOSPITAL_COMMUNITY): Payer: BLUE CROSS/BLUE SHIELD

## 2015-06-10 ENCOUNTER — Ambulatory Visit (INDEPENDENT_AMBULATORY_CARE_PROVIDER_SITE_OTHER): Payer: BLUE CROSS/BLUE SHIELD | Admitting: Cardiovascular Disease

## 2015-06-10 ENCOUNTER — Encounter (HOSPITAL_COMMUNITY)
Admission: RE | Admit: 2015-06-10 | Discharge: 2015-06-10 | Disposition: A | Discharge status: Home / Self Care | Payer: BLUE CROSS/BLUE SHIELD | Source: Ambulatory Visit | Attending: Cardiovascular Disease | Admitting: Cardiovascular Disease

## 2015-06-10 VITALS — BP 80/52 | HR 75 | Ht 75.0 in | Wt 198.0 lb

## 2015-06-10 DIAGNOSIS — Z9889 Other specified postprocedural states: Secondary | ICD-10-CM | POA: Diagnosis not present

## 2015-06-10 DIAGNOSIS — Z48812 Encounter for surgical aftercare following surgery on the circulatory system: Secondary | ICD-10-CM | POA: Diagnosis not present

## 2015-06-10 NOTE — Patient Instructions (Addendum)
Medication Instructions:  Your physician recommends that you continue on your current medications as directed. Please refer to the Current Medication list given to you today.   Labwork: NONE  Testing/Procedures: NONE Follow-Up: Your physician recommends that you schedule a follow-up appointment in: [place]     Any Other Special Instructions Will Be Listed Below (If Applicable).

## 2015-06-12 ENCOUNTER — Encounter (HOSPITAL_COMMUNITY)
Admission: RE | Admit: 2015-06-12 | Discharge: 2015-06-12 | Disposition: A | Discharge status: Home / Self Care | Payer: BLUE CROSS/BLUE SHIELD | Source: Ambulatory Visit | Attending: Cardiovascular Disease | Admitting: Cardiovascular Disease

## 2015-06-12 ENCOUNTER — Ambulatory Visit (INDEPENDENT_AMBULATORY_CARE_PROVIDER_SITE_OTHER): Payer: BLUE CROSS/BLUE SHIELD | Admitting: Family Medicine

## 2015-06-12 ENCOUNTER — Encounter: Payer: Self-pay | Admitting: Family Medicine

## 2015-06-12 VITALS — BP 106/60 | HR 72 | Temp 98.0°F | Wt 199.0 lb

## 2015-06-12 DIAGNOSIS — H6122 Impacted cerumen, left ear: Secondary | ICD-10-CM

## 2015-06-12 DIAGNOSIS — J029 Acute pharyngitis, unspecified: Secondary | ICD-10-CM

## 2015-06-12 DIAGNOSIS — Z48812 Encounter for surgical aftercare following surgery on the circulatory system: Secondary | ICD-10-CM | POA: Diagnosis not present

## 2015-06-12 DIAGNOSIS — Z23 Encounter for immunization: Secondary | ICD-10-CM | POA: Diagnosis not present

## 2015-06-12 LAB — POCT RAPID STREP A (OFFICE): Rapid Strep A Screen: NEGATIVE

## 2015-06-12 NOTE — Progress Notes (Signed)
Subjective:  Andrew Ayala is a 69 y.o. male who presents for evaluation of 3 days history of sore throat, hoarseness, nasal drainage, swollen and tender cervical lymph nodes, mild dry cough and left ear feeling clogged.  He has not had a recent close exposure to someone with proven streptococcal pharyngitis.  Denies fever, chills, nausea, vomiting, diarrhea. He reports a history of hayfever. He does not smoke.   Treatment to date: Tylenol.  Possible sick contacts since he was at a wedding over the weekend.  Requests a flu shot today.  The following portions of the patient's history were reviewed and updated as appropriate: allergies, current medications, past medical history, past social history, past surgical history and problem list.  ROS as in subjective   Objective: Filed Vitals:   06/12/15 0907  BP: 106/60  Pulse: 72  Temp: 98 F (36.7 C)    General appearance: no distress, WD/WN, is not ill-appearing HEENT: normocephalic, conjunctiva/corneas normal, sclerae anicteric, nares patent with mild edema and erythema, no discharge, pharynx with erythema and edema, no exudate.  Oral cavity: MMM, no lesions  Neck: supple, mild left cerical lymphadenopathy and tenderness, no thyromegaly Heart: RRR, normal S1, S2, no murmurs Lungs: CTA bilaterally, no wheezes, rhonchi, or rales    Laboratory Strep test was done. Results negative   Assessment and Plan:  Ear irrigation performed for cerumen impaction to left ear by Gabriel Cirri, CMA.  Advised that symptoms and exam suggest a viral etiology.  Discussed symptomatic treatment including salt water gargles, warm fluids, rest, hydrate well, can use over-the-counter Tylenol for throat pain, fever, or malaise. If worse or not improving within 2-3 days, call or return.

## 2015-06-12 NOTE — Patient Instructions (Addendum)
Your strep test today was negative. Try warm saltwater gargles and warm fluids to soothe the throat. You may want to use lozenges for comfort as well. Try using your Flonase to see if this helps with the drainage. Drink plenty of fluids and rest. He may use Tylenol for discomfort. Let us know if you are not feeling better in the next 2-3 days. You received your flu vaccine today.   Laryngitis At the top of your windpipe is your voice box. It is the source of your voice. Inside your voice box are 2 bands of muscles called vocal cords. When you breathe, your vocal cords are relaxed and open so that air can get into the lungs. When you decide to say something, these cords come together and vibrate. The sound from these vibrations goes into your throat and comes out through your mouth as sound. Laryngitis is an inflammation of the vocal cords that causes hoarseness, cough, loss of voice, sore throat, and dry throat. Laryngitis can be temporary (acute) or long-term (chronic). Most cases of acute laryngitis improve with time.Chronic laryngitis lasts for more than 3 weeks. CAUSES Laryngitis can often be related to excessive smoking, talking, or yelling, as well as inhalation of toxic fumes and allergies. Acute laryngitis is usually caused by a viral infection, vocal strain, measles or mumps, or bacterial infections. Chronic laryngitis is usually caused by vocal cord strain, vocal cord injury, postnasal drip, growths on the vocal cords, or acid reflux. SYMPTOMS   Cough.  Sore throat.  Dry throat. RISK FACTORS  Respiratory infections.  Exposure to irritating substances, such as cigarette smoke, excessive amounts of alcohol, stomach acids, and workplace chemicals.  Voice trauma, such as vocal cord injury from shouting or speaking too loud. DIAGNOSIS  Your cargiver will perform a physical exam. During the physical exam, your caregiver will examine your throat. The most common sign of laryngitis is  hoarseness. Laryngoscopy may be necessary to confirm the diagnosis of this condition. This procedure allows your caregiver to look into the larynx. HOME CARE INSTRUCTIONS  Drink enough fluids to keep your urine clear or pale yellow.  Rest until you no longer have symptoms or as directed by your caregiver.  Breathe in moist air.  Take all medicine as directed by your caregiver.  Do not smoke.  Talk as little as possible (this includes whispering).  Write on paper instead of talking until your voice is back to normal.  Follow up with your caregiver if your condition has not improved after 10 days. SEEK MEDICAL CARE IF:   You have trouble breathing.  You cough up blood.  You have persistent fever.  You have increasing pain.  You have difficulty swallowing. MAKE SURE YOU:  Understand these instructions.  Will watch your condition.  Will get help right away if you are not doing well or get worse. Document Released: 09/07/2005 Document Revised: 11/30/2011 Document Reviewed: 11/13/2010 [facility] Patient Information 2015 [place], [place]. This information is not intended to replace advice given to you by your health care provider. Make sure you discuss any questions you have with your health care provider.

## 2015-06-14 ENCOUNTER — Telehealth (HOSPITAL_COMMUNITY): Payer: Self-pay | Admitting: *Deleted

## 2015-06-14 ENCOUNTER — Encounter (HOSPITAL_COMMUNITY): Payer: BLUE CROSS/BLUE SHIELD

## 2015-06-17 ENCOUNTER — Encounter (HOSPITAL_COMMUNITY): Payer: BLUE CROSS/BLUE SHIELD

## 2015-06-17 ENCOUNTER — Telehealth (HOSPITAL_COMMUNITY): Payer: Self-pay | Admitting: Family Medicine

## 2015-06-18 ENCOUNTER — Encounter: Payer: Self-pay | Admitting: Internal Medicine

## 2015-06-19 ENCOUNTER — Encounter (HOSPITAL_COMMUNITY)
Admission: RE | Admit: 2015-06-19 | Discharge: 2015-06-19 | Disposition: A | Discharge status: Home / Self Care | Payer: BLUE CROSS/BLUE SHIELD | Source: Ambulatory Visit | Attending: Cardiovascular Disease | Admitting: Cardiovascular Disease

## 2015-06-19 DIAGNOSIS — Z48812 Encounter for surgical aftercare following surgery on the circulatory system: Secondary | ICD-10-CM | POA: Diagnosis not present

## 2015-06-21 ENCOUNTER — Encounter (HOSPITAL_COMMUNITY)
Admission: RE | Admit: 2015-06-21 | Discharge: 2015-06-21 | Disposition: A | Discharge status: Home / Self Care | Payer: BLUE CROSS/BLUE SHIELD | Source: Ambulatory Visit | Attending: Cardiovascular Disease | Admitting: Cardiovascular Disease

## 2015-06-21 DIAGNOSIS — Z48812 Encounter for surgical aftercare following surgery on the circulatory system: Secondary | ICD-10-CM | POA: Diagnosis not present

## 2015-06-24 ENCOUNTER — Encounter (HOSPITAL_COMMUNITY)
Admission: RE | Admit: 2015-06-24 | Discharge: 2015-06-24 | Disposition: A | Discharge status: Home / Self Care | Payer: BLUE CROSS/BLUE SHIELD | Source: Ambulatory Visit | Attending: Cardiovascular Disease | Admitting: Cardiovascular Disease

## 2015-06-24 DIAGNOSIS — Z48812 Encounter for surgical aftercare following surgery on the circulatory system: Secondary | ICD-10-CM | POA: Insufficient documentation

## 2015-06-26 ENCOUNTER — Encounter (HOSPITAL_COMMUNITY)
Admission: RE | Admit: 2015-06-26 | Discharge: 2015-06-26 | Disposition: A | Discharge status: Home / Self Care | Payer: BLUE CROSS/BLUE SHIELD | Source: Ambulatory Visit | Attending: Cardiovascular Disease | Admitting: Cardiovascular Disease

## 2015-06-26 DIAGNOSIS — Z48812 Encounter for surgical aftercare following surgery on the circulatory system: Secondary | ICD-10-CM | POA: Diagnosis not present

## 2015-06-28 ENCOUNTER — Encounter (HOSPITAL_COMMUNITY)
Admission: RE | Admit: 2015-06-28 | Discharge: 2015-06-28 | Disposition: A | Discharge status: Home / Self Care | Payer: BLUE CROSS/BLUE SHIELD | Source: Ambulatory Visit | Attending: Cardiovascular Disease | Admitting: Cardiovascular Disease

## 2015-06-28 DIAGNOSIS — Z48812 Encounter for surgical aftercare following surgery on the circulatory system: Secondary | ICD-10-CM | POA: Diagnosis not present

## 2015-07-01 ENCOUNTER — Encounter (HOSPITAL_COMMUNITY)
Admission: RE | Admit: 2015-07-01 | Discharge: 2015-07-01 | Disposition: A | Discharge status: Home / Self Care | Payer: BLUE CROSS/BLUE SHIELD | Source: Ambulatory Visit | Attending: Cardiovascular Disease | Admitting: Cardiovascular Disease

## 2015-07-01 DIAGNOSIS — Z48812 Encounter for surgical aftercare following surgery on the circulatory system: Secondary | ICD-10-CM | POA: Diagnosis not present

## 2015-07-03 ENCOUNTER — Encounter (HOSPITAL_COMMUNITY)
Admission: RE | Admit: 2015-07-03 | Discharge: 2015-07-03 | Disposition: A | Discharge status: Home / Self Care | Payer: BLUE CROSS/BLUE SHIELD | Source: Ambulatory Visit | Attending: Cardiovascular Disease | Admitting: Cardiovascular Disease

## 2015-07-03 DIAGNOSIS — Z48812 Encounter for surgical aftercare following surgery on the circulatory system: Secondary | ICD-10-CM | POA: Diagnosis not present

## 2015-07-05 ENCOUNTER — Encounter (HOSPITAL_COMMUNITY)
Admission: RE | Admit: 2015-07-05 | Discharge: 2015-07-05 | Disposition: A | Discharge status: Home / Self Care | Payer: BLUE CROSS/BLUE SHIELD | Source: Ambulatory Visit | Attending: Cardiovascular Disease | Admitting: Cardiovascular Disease

## 2015-07-05 DIAGNOSIS — Z48812 Encounter for surgical aftercare following surgery on the circulatory system: Secondary | ICD-10-CM | POA: Diagnosis not present

## 2015-07-08 ENCOUNTER — Encounter (HOSPITAL_COMMUNITY)
Admission: RE | Admit: 2015-07-08 | Discharge: 2015-07-08 | Disposition: A | Discharge status: Home / Self Care | Payer: BLUE CROSS/BLUE SHIELD | Source: Ambulatory Visit | Attending: Cardiovascular Disease | Admitting: Cardiovascular Disease

## 2015-07-08 DIAGNOSIS — Z48812 Encounter for surgical aftercare following surgery on the circulatory system: Secondary | ICD-10-CM | POA: Diagnosis not present

## 2015-07-10 ENCOUNTER — Encounter (HOSPITAL_COMMUNITY)
Admission: RE | Admit: 2015-07-10 | Discharge: 2015-07-10 | Disposition: A | Discharge status: Home / Self Care | Payer: BLUE CROSS/BLUE SHIELD | Source: Ambulatory Visit | Attending: Cardiovascular Disease | Admitting: Cardiovascular Disease

## 2015-07-10 DIAGNOSIS — Z48812 Encounter for surgical aftercare following surgery on the circulatory system: Secondary | ICD-10-CM | POA: Diagnosis not present

## 2015-07-10 NOTE — Progress Notes (Signed)
Pt graduated from cardiac rehab program today with completion of 36 exercise sessions in Phase II. Pt maintained good attendance and progressed nicely during his participation in rehab as evidenced by increased MET level.   Medication list reconciled. Repeat  PHQ score- 0 .  Pt has made significant lifestyle changes and should be commended for his success. Pt feels he has achieved his goals during cardiac rehab.   Pt plans to continue exercise on his nordic track at home and walking.

## 2015-07-12 ENCOUNTER — Encounter (HOSPITAL_COMMUNITY): Payer: BLUE CROSS/BLUE SHIELD

## 2015-09-10 ENCOUNTER — Other Ambulatory Visit: Payer: Self-pay | Admitting: Family Medicine

## 2015-09-10 NOTE — Telephone Encounter (Signed)
Pt is scheduled for physical Friday 10/10/14.

## 2015-10-11 ENCOUNTER — Ambulatory Visit (INDEPENDENT_AMBULATORY_CARE_PROVIDER_SITE_OTHER): Payer: BLUE CROSS/BLUE SHIELD | Admitting: Family Medicine

## 2015-10-11 ENCOUNTER — Encounter: Payer: Self-pay | Admitting: Family Medicine

## 2015-10-11 VITALS — BP 118/76 | HR 64 | Ht 75.5 in | Wt 199.2 lb

## 2015-10-11 DIAGNOSIS — Z9889 Other specified postprocedural states: Secondary | ICD-10-CM

## 2015-10-11 DIAGNOSIS — Z0000 Encounter for general adult medical examination without abnormal findings: Secondary | ICD-10-CM

## 2015-10-11 DIAGNOSIS — J309 Allergic rhinitis, unspecified: Secondary | ICD-10-CM | POA: Diagnosis not present

## 2015-10-11 DIAGNOSIS — I341 Nonrheumatic mitral (valve) prolapse: Secondary | ICD-10-CM

## 2015-10-11 DIAGNOSIS — E785 Hyperlipidemia, unspecified: Secondary | ICD-10-CM

## 2015-10-11 DIAGNOSIS — N400 Enlarged prostate without lower urinary tract symptoms: Secondary | ICD-10-CM | POA: Diagnosis not present

## 2015-10-11 DIAGNOSIS — N529 Male erectile dysfunction, unspecified: Secondary | ICD-10-CM

## 2015-10-11 DIAGNOSIS — Z1159 Encounter for screening for other viral diseases: Secondary | ICD-10-CM | POA: Diagnosis not present

## 2015-10-11 DIAGNOSIS — Z86010 Personal history of colonic polyps: Secondary | ICD-10-CM

## 2015-10-11 LAB — CBC WITH DIFFERENTIAL/PLATELET
BASOS PCT: 1 % (ref 0–1)
Basophils Absolute: 0.1 10*3/uL (ref 0.0–0.1)
Eosinophils Absolute: 0.3 10*3/uL (ref 0.0–0.7)
Eosinophils Relative: 6 % — ABNORMAL HIGH (ref 0–5)
HEMATOCRIT: 45 % (ref 39.0–52.0)
HEMOGLOBIN: 15.3 g/dL (ref 13.0–17.0)
Lymphocytes Relative: 37 % (ref 12–46)
Lymphs Abs: 2 10*3/uL (ref 0.7–4.0)
MCH: 32.9 pg (ref 26.0–34.0)
MCHC: 34 g/dL (ref 30.0–36.0)
MCV: 96.8 fL (ref 78.0–100.0)
MPV: 10.9 fL (ref 8.6–12.4)
Monocytes Absolute: 0.5 10*3/uL (ref 0.1–1.0)
Monocytes Relative: 9 % (ref 3–12)
NEUTROS ABS: 2.6 10*3/uL (ref 1.7–7.7)
Neutrophils Relative %: 47 % (ref 43–77)
Platelets: 181 10*3/uL (ref 150–400)
RBC: 4.65 MIL/uL (ref 4.22–5.81)
RDW: 13.3 % (ref 11.5–15.5)
WBC: 5.5 10*3/uL (ref 4.0–10.5)

## 2015-10-11 LAB — POCT URINALYSIS DIPSTICK
Bilirubin, UA: NEGATIVE
Blood, UA: NEGATIVE
Glucose, UA: NEGATIVE
Ketones, UA: NEGATIVE
LEUKOCYTES UA: NEGATIVE
NITRITE UA: NEGATIVE
Protein, UA: NEGATIVE
UROBILINOGEN UA: NEGATIVE
pH, UA: 6

## 2015-10-11 LAB — COMPREHENSIVE METABOLIC PANEL
ALBUMIN: 4.3 g/dL (ref 3.6–5.1)
ALT: 17 U/L (ref 9–46)
AST: 18 U/L (ref 10–35)
Alkaline Phosphatase: 70 U/L (ref 40–115)
BILIRUBIN TOTAL: 0.9 mg/dL (ref 0.2–1.2)
BUN: 17 mg/dL (ref 7–25)
CALCIUM: 9.2 mg/dL (ref 8.6–10.3)
CO2: 26 mmol/L (ref 20–31)
CREATININE: 0.92 mg/dL (ref 0.70–1.25)
Chloride: 102 mmol/L (ref 98–110)
GLUCOSE: 84 mg/dL (ref 65–99)
Potassium: 4.1 mmol/L (ref 3.5–5.3)
SODIUM: 139 mmol/L (ref 135–146)
Total Protein: 7 g/dL (ref 6.1–8.1)

## 2015-10-11 LAB — LIPID PANEL
CHOLESTEROL: 175 mg/dL (ref 125–200)
HDL: 42 mg/dL (ref >=40)
LDL CALC: 115 mg/dL (ref <130)
Total CHOL/HDL Ratio: 4.2 Ratio (ref <=5.0)
Triglycerides: 88 mg/dL (ref <150)
VLDL: 18 mg/dL (ref <30)

## 2015-10-11 MED ORDER — FINASTERIDE 5 MG PO TABS: 5.0000 mg | ORAL_TABLET | Freq: Every day | ORAL | Status: DC

## 2015-10-11 MED ORDER — ATORVASTATIN CALCIUM 10 MG PO TABS: 10.0000 mg | ORAL_TABLET | Freq: Every day | ORAL | Status: DC

## 2015-10-11 NOTE — Progress Notes (Signed)
Subjective:    Patient ID: Andrew Ayala, male    DOB: 01/30/1946, 70 y.o.   MRN: [id number]  HPI He is here for complete examination. He has had recent mitral valve repair. He has noted occasional skipping of beats but has had no chest pain, shortness breath, DOE. He does have underlying allergies which are under good control. He does have BPH and presently is on Proscar. He says he has nocturia 3. He has been drinking plenty of fluids during the day and sometimes overdoes it by having to urinate as much is every half-hour. He has a previous history of colonic polyps but is up-to-date on his colonoscopy. He does have ED and does use Levitra. He has had no difficulty with GI issues . He is planning on retiring within the next several months and does have a trip planned to [place]. Family and social history as well as health maintenance and immunizations were reviewed. Does have an advanced directive.  Review of Systems  All other systems reviewed and are negative.      Objective:   Physical Exam BP 118/76 mmHg  Pulse 64  Ht 6' 3.5" (1.918 m)  Wt 199 lb 3.2 oz (90.357 kg)  BMI 24.56 kg/m2  General Appearance:    Alert, cooperative, no distress, appears stated age  Head:    Normocephalic, without obvious abnormality, atraumatic  Eyes:    PERRL, conjunctiva/corneas clear, EOM's intact, fundi    benign  Ears:    Normal TM's and external ear canals  Nose:   Nares normal, mucosa normal, no drainage or sinus   tenderness  Throat:   Lips, mucosa, and tongue normal; teeth and gums normal  Neck:   Supple, no lymphadenopathy;  thyroid:  no   enlargement/tenderness/nodules; no carotid   bruit or JVD  Back:    Spine nontender, no curvature, ROM normal, no CVA     tenderness  Lungs:     Clear to auscultation bilaterally without wheezes, rales or     ronchi; respirations unlabored  Chest Wall:    No tenderness or deformity   Heart:    Regular rate and rhythm, S1 and S2 normal, no murmur, rub   or gallop.Very rare skipped beat with compensatory cause was noted.  Breast Exam:    No chest wall tenderness, masses or gynecomastia  Abdomen:     Soft, non-tender, nondistended, normoactive bowel sounds,    no masses, no hepatosplenomegaly        Extremities:   No clubbing, cyanosis or edema  Pulses:   2+ and symmetric all extremities  Skin:   Skin color, texture, turgor normal, no rashes or lesions  Lymph nodes:   Cervical, supraclavicular, and axillary nodes normal  Neurologic:   CNII-XII intact, normal strength, sensation and gait; reflexes 2+ and symmetric throughout          Psych:   Normal mood, affect, hygiene and grooming.          Assessment & Plan:  Routine general medical examination at a health care facility - Plan: POCT urinalysis dipstick, CBC with Differential/Platelet, Comprehensive metabolic panel, Lipid panel, PSA  Allergic rhinitis, mild  BPH (benign prostatic hyperplasia) - Plan: finasteride (PROSCAR) 5 MG tablet  Erectile dysfunction, unspecified erectile dysfunction type  Hx of colonic polyps  Hyperlipidemia LDL goal <100 - Plan: atorvastatin (LIPITOR) 10 MG tablet  MVP (mitral valve prolapse)  S/P minimally invasive mitral valve repair  Need for hepatitis C screening test - Plan:  Hepatitis C antibody Did recommend he cut back slightly on his hydration especially early in the morning.Also discussed his retirement in regard to keeping himself physically and mentally busy. Recommended continuing with physical activity which he and his wife are apparently doing.

## 2015-10-11 NOTE — Progress Notes (Deleted)
Andrew Ayala is a 70 y.o. male who presents for annual wellness visit and follow-up on chronic medical conditions.  He has the following concerns:   Immunization History  Administered Date(s) Administered  . DT 01/15/1998, 05/27/2007  . Influenza Split 07/14/2001, 09/25/2003, 06/18/2012, 07/05/2013, 06/19/2014  . Influenza, High Dose Seasonal PF 06/12/2015  . PPD Test 09/01/2001  . Pneumococcal Conjugate-13 08/25/2013  . Pneumococcal Polysaccharide-23 05/17/2006  . Tdap 05/27/2007  . Zoster 08/24/2008   Last colonoscopy: 04/04/2012 Last PSA: 2013 Dentist: Dr. Lin Landsman Ophtho: [facility] ophtho  Exercise: 3 days a week.   Other doctors caring for patient include:  Cardiologist- Dr. Johnsie Cancel,  Dermatologist- Lennie Odor at [facility]- Dr. Henrene Pastor @ [facility]  Depression screen:  See questionnaire below.     Depression screen [facility] 2/9 10/11/2015 07/10/2015 04/10/2015 01/22/2015 08/25/2013  Decreased Interest 0 0 0 0 0  Down, Depressed, Hopeless 0 0 0 0 0  PHQ - 2 Score 0 0 0 0 0    Fall Screen: See Questionaire below.   Fall Risk  10/11/2015 01/22/2015 08/25/2013  Falls in the past year? No No No    ADL screen:  See questionnaire below.  Functional Status Survey: Is the patient deaf or have difficulty hearing?: No Does the patient have difficulty seeing, even when wearing glasses/contacts?: No Does the patient have difficulty concentrating, remembering, or making decisions?: No Does the patient have difficulty walking or climbing stairs?: No Does the patient have difficulty dressing or bathing?: No Does the patient have difficulty doing errands alone such as visiting a doctor's office or shopping?: No   End of Life Discussion:  Patient {ACTIONS; HAS/DOES NOT HAVE:19233} a living will and medical power of attorney   Review of Systems  Constitutional: -fever, -chills, -sweats, -unexpected weight change, -anorexia, -fatigue Allergy: -sneezing, -itching,  -congestion Dermatology: denies changing moles, rash, lumps, new worrisome lesions ENT: -runny nose, -ear pain, -sore throat, -hoarseness, -sinus pain, -teeth pain, -tinnitus, -hearing loss, -epistaxis Cardiology:  -chest pain, -palpitations, -edema, -orthopnea, -paroxysmal nocturnal dyspnea Respiratory: -cough, -shortness of breath, -dyspnea on exertion, -wheezing, -hemoptysis Gastroenterology: -abdominal pain, -nausea, -vomiting, -diarrhea, -constipation, -blood in stool, -changes in bowel movement, -dysphagia Hematology: -bleeding or bruising problems Musculoskeletal: -arthralgias, -myalgias, -joint swelling, -back pain, -neck pain, -cramping, -gait changes Ophthalmology: -vision changes, -eye redness, -itching, -discharge Urology: -dysuria, -difficulty urinating, -hematuria, -urinary frequency, -urgency, incontinence Neurology: -headache, -weakness, -tingling, -numbness, -speech abnormality, -memory loss, -falls, -dizziness Psychology:  -depressed mood, -agitation, -sleep problems   PHYSICAL EXAM:  There were no vitals taken for this visit.  General Appearance: Alert, cooperative, no distress, appears stated age Head: Normocephalic, without obvious abnormality, atraumatic Eyes: PERRL, conjunctiva/corneas clear, EOM's intact, fundi benign Ears: Normal TM's and external ear canals Nose: Nares normal, mucosa normal, no drainage or sinus   tenderness Throat: Lips, mucosa, and tongue normal; teeth and gums normal Neck: Supple, no lymphadenopathy, thyroid:no enlargement/tenderness/nodules; no carotid bruit or JVD Back: Spine nontender, no curvature, ROM normal, no CVA tenderness Lungs: Clear to auscultation bilaterally without wheezes, rales or ronchi; respirations unlabored Chest Wall: No tenderness or deformity Heart: Regular rate and rhythm, S1 and S2 normal, no murmur, rub or gallop Breast Exam: No chest wall tenderness, masses or gynecomastia Abdomen: Soft, non-tender, nondistended,  normoactive bowel sounds, no masses, no hepatosplenomegaly Genitalia: Normal male external genitalia without lesions.  Testicles without masses.  No inguinal hernias. Rectal: Normal sphincter tone, no masses or tenderness; guaiac negative stool.  Prostate smooth, no nodules, not enlarged. Extremities:  No clubbing, cyanosis or edema Pulses: 2+ and symmetric all extremities Skin: Skin color, texture, turgor normal, no rashes or lesions Lymph nodes: Cervical, supraclavicular, and axillary nodes normal Neurologic: CNII-XII intact, normal strength, sensation and gait; reflexes 2+ and symmetric throughout   Psych: Normal mood, affect, hygiene and grooming  ASSESSMENT/PLAN:    Discussed PSA screening (risks/benefits), recommended at least 30 minutes of aerobic activity at least 5 days/week; proper sunscreen use reviewed; healthy diet and alcohol recommendations (less than or equal to 2 drinks/day) reviewed; regular seatbelt use; changing batteries in smoke detectors. Immunization recommendations discussed.  Colonoscopy recommendations reviewed.   Medicare Attestation I have personally reviewed: The patient's medical and social history Their use of alcohol, tobacco or illicit drugs Their current medications and supplements The patient's functional ability including ADLs,fall risks, home safety risks, cognitive, and hearing and visual impairment Diet and physical activities Evidence for depression or mood disorders  The patient's weight, height, and BMI have been recorded in the chart.  I have made referrals, counseling, and provided education to the patient based on review of the above and I have provided the patient with a written personalized care plan for preventive services.     Wyatt Haste, MD   10/11/2015

## 2015-10-12 LAB — HEPATITIS C ANTIBODY: HCV Ab: NEGATIVE

## 2015-10-12 LAB — PSA: PSA: 0.14 ng/mL (ref <=4.00)

## 2015-10-14 ENCOUNTER — Telehealth: Payer: Self-pay

## 2015-10-14 NOTE — Telephone Encounter (Signed)
Left message for patient to call office regarding normal lab results. 

## 2015-10-14 NOTE — Telephone Encounter (Signed)
Patient aware of lab results.

## 2015-10-14 NOTE — Telephone Encounter (Signed)
-----   Message from Denita Lung, MD sent at 10/12/2015  3:30 PM EST ----- [place] work looks good

## 2015-10-25 DIAGNOSIS — D1801 Hemangioma of skin and subcutaneous tissue: Secondary | ICD-10-CM | POA: Diagnosis not present

## 2015-10-25 DIAGNOSIS — L821 Other seborrheic keratosis: Secondary | ICD-10-CM | POA: Diagnosis not present

## 2015-10-25 DIAGNOSIS — D225 Melanocytic nevi of trunk: Secondary | ICD-10-CM | POA: Diagnosis not present

## 2015-10-25 DIAGNOSIS — L820 Inflamed seborrheic keratosis: Secondary | ICD-10-CM | POA: Diagnosis not present

## 2015-10-25 DIAGNOSIS — L700 Acne vulgaris: Secondary | ICD-10-CM | POA: Diagnosis not present

## 2015-10-25 DIAGNOSIS — L300 Nummular dermatitis: Secondary | ICD-10-CM | POA: Diagnosis not present

## 2015-10-25 DIAGNOSIS — L812 Freckles: Secondary | ICD-10-CM | POA: Diagnosis not present

## 2015-10-25 DIAGNOSIS — L218 Other seborrheic dermatitis: Secondary | ICD-10-CM | POA: Diagnosis not present

## 2015-11-25 ENCOUNTER — Ambulatory Visit: Payer: Self-pay

## 2015-11-25 DIAGNOSIS — Z9889 Other specified postprocedural states: Secondary | ICD-10-CM

## 2016-03-02 ENCOUNTER — Encounter: Payer: BLUE CROSS/BLUE SHIELD | Admitting: Thoracic Surgery (Cardiothoracic Vascular Surgery)

## 2016-03-09 ENCOUNTER — Other Ambulatory Visit: Payer: Self-pay | Admitting: *Deleted

## 2016-03-09 ENCOUNTER — Encounter: Payer: Self-pay | Admitting: Thoracic Surgery (Cardiothoracic Vascular Surgery)

## 2016-03-09 ENCOUNTER — Ambulatory Visit (INDEPENDENT_AMBULATORY_CARE_PROVIDER_SITE_OTHER): Payer: BLUE CROSS/BLUE SHIELD | Admitting: Thoracic Surgery (Cardiothoracic Vascular Surgery)

## 2016-03-09 VITALS — BP 99/63 | HR 87 | Resp 16 | Ht 75.0 in | Wt 198.0 lb

## 2016-03-09 DIAGNOSIS — Z9889 Other specified postprocedural states: Secondary | ICD-10-CM | POA: Diagnosis not present

## 2016-03-09 DIAGNOSIS — I38 Endocarditis, valve unspecified: Secondary | ICD-10-CM

## 2016-03-09 DIAGNOSIS — I340 Nonrheumatic mitral (valve) insufficiency: Secondary | ICD-10-CM | POA: Diagnosis not present

## 2016-03-09 MED ORDER — AMOXICILLIN 500 MG PO CAPS: 2000.0000 mg | ORAL_CAPSULE | Freq: Once | ORAL | Status: DC

## 2016-03-09 NOTE — Progress Notes (Signed)
[address]       [place] [address]             [phone]     CARDIOTHORACIC SURGERY OFFICE NOTE  Referring Provider is Josue Hector, MD PCP is Wyatt Haste, MD   HPI:  Patient returns to the office for routine follow-up approximately one year status post minimally invasive mitral valve repair on 02/27/2015. His postoperative recovery was uneventful and routine follow-up echocardiogram performed 03/27/2015 revealed normal left ventricular systolic function with ejection fraction estimated 50-55%. The mitral valve repair appeared intact with no residual mitral regurgitation. Peak transvalvular gradient across mitral valve was estimated 6 mmHg.  He was last seen here in our office on 06/03/2015 at which time he was doing well.  Since then the patient has been seen in follow-up by Dr. Johnsie Cancel and Dr. Redmond School.  He returns for office today reporting that he feels quite well. He has no complaints whatsoever. The patient was notably asymptomatic prior to surgery, but overall he thinks he may be somewhat improved in comparison with how he felt prior to surgery. He only gets short of breath with very strenuous exertion. He reports no significant physical limitations. He went to his dentist for routine dental cleaning and asked about antibiotic prophylaxis. The technician in his dentist office informed him that they felt he did not need to have antibiotics.   Current Outpatient Prescriptions  Medication Sig Dispense Refill  . aspirin EC 81 MG tablet Take 81 mg by mouth daily before supper.    Marland Kitchen atorvastatin (LIPITOR) 10 MG tablet Take 1 tablet (10 mg total) by mouth daily. 90 tablet 3  . Eyelid Cleansers (OCUSOFT LID SCRUB EX) Apply topically 2 (two) times daily.     . finasteride (PROSCAR) 5 MG tablet Take 1 tablet (5 mg total) by mouth daily. 90 tablet 3  . fluticasone (VERAMYST) 27.5 MCG/SPRAY nasal spray Place 2 sprays into the nose daily as needed for  rhinitis. Reported on 10/11/2015    . GLUCOSAMINE-CHONDROITIN PO Take 1 tablet by mouth daily. Glucosamine 1500 mg/ chrondroiton 1200 mg    . ketotifen (ZADITOR) 0.025 % ophthalmic solution Apply 1 drop to eye daily as needed (Itchy Eyes).    Marland Kitchen LEVITRA 10 MG tablet TAKE 1 TABLET (10 MG TOTAL) BY MOUTH DAILY AS NEEDED FOR ERECTILE DYSFUNCTION. 10 tablet 3  . Multiple Vitamins-Minerals (MULTIVITAMIN WITH MINERALS) tablet Take 1 tablet by mouth daily.    . Omega-3 Fatty Acids (FISH OIL) 1200 MG CAPS Take 1 capsule by mouth at bedtime.    . Pseudoephedrine HCl (SUDAFED 24 HOUR PO) Take 1 tablet by mouth daily as needed (allergies). Reported on 10/11/2015     No current facility-administered medications for this visit.      Physical Exam:   BP 99/63 mmHg  Pulse 87  Resp 16  Ht 6\' 3"  (1.905 m)  Wt 198 lb (89.812 kg)  BMI 24.75 kg/m2  SpO2 99%  General:  Well appearing  Chest:   Clear to auscultation  CV:   Regular rate and rhythm without murmur  Incisions:  Completely healed  Abdomen:  Soft nontender  Extremities:  Warm and well perfused  Diagnostic Tests:  n/a   Impression:  Patient is doing very well approximately one year status post minimally invasive mitral valve repair.  Plan:  We have not recommended any changes to the patient's current medications.  The patient has been reminded regarding the importance  of dental hygiene and the lifelong need for antibiotic prophylaxis for all dental cleanings and other related invasive procedures.  He is now aware of the fact that he was given incorrect information when he went to his dentist recently. In the future the patient will call and return to see [place] should specific problems or difficulties arise. All of his questions have been answered.  I spent in excess of 15 minutes during the conduct of this office consultation and >50% of this time involved direct face-to-face encounter with the patient for counseling and/or coordination of  their care.    Valentina Gu. Roxy Manns, MD 03/09/2016 2:12 PM

## 2016-03-09 NOTE — Patient Instructions (Signed)
Continue all previous medications without any changes at this time  You may resume normal physical activity without any particular limitations at this time, and return to our office only as needed should any further problems or questions arise.  Endocarditis is a potentially serious infection of heart valves or inside lining of the heart.  It occurs more commonly in patients with diseased heart valves (such as patient's with aortic or mitral valve disease) and in patients who have undergone heart valve repair or replacement.  Certain surgical and dental procedures may put you at risk, such as dental cleaning, other dental procedures, or any surgery involving the respiratory, urinary, gastrointestinal tract, gallbladder or prostate gland.   To minimize your chances for develooping endocarditis, maintain good oral health and seek prompt medical attention for any infections involving the mouth, teeth, gums, skin or urinary tract.    Always notify your doctor or dentist about your underlying heart valve condition before having any invasive procedures. You will need to take antibiotics before certain procedures, including all routine dental cleanings or other dental procedures.  Your cardiologist or dentist should prescribe these antibiotics for you to be taken ahead of time.       

## 2016-03-16 ENCOUNTER — Encounter: Payer: BLUE CROSS/BLUE SHIELD | Admitting: Thoracic Surgery (Cardiothoracic Vascular Surgery)

## 2016-06-11 NOTE — Progress Notes (Signed)
Patient ID: Andrew Ayala, male   DOB: 09/17/1946, 70 y.o.   MRN: [id number]    Cardiology Office Note   Date:  06/18/2016   ID:  Andrew Ayala, DOB 08/26/1946, MRN [id number]  PCP:  Andrew Haste, MD  Cardiologist:   Jenkins Rouge, MD   Chief Complaint  Patient presents with  . Encounter for long-term (current) use of other high-risk med  . Mitral Regurgitation      History of Present Illness: Andrew Ayala is a 70 y.o. male who presents for evaluation of MR.  Long standing history of MVP  Seen by [facility] about 12 years ago and told he was fine.  Despite murmur no f/u echo done since then.  Still working in [employer] but is otherwise sedentary.  No chest pain dyspnea palpitations or syncope CRF;s elevated lipids on statin No hisotry of rheumatic fever, connective tissue disease or CAD.  Had TTE done Echo done 09/12/14 reviewed   Study Conclusions  - Left ventricle: The cavity size was normal. Wall thickness was normal. Systolic function was normal. The estimated ejection fraction was in the range of 55% to 60%. - Mitral valve: Bileaflet prolapse worse in the posterior leaflet with more anteriorly directed MR. Degree of MR varies from parasternal to apical views but overall does appear severe Consider TEE to further evaluate since valve would be repairable - Left atrium: The atrium was mildly dilated. - Atrial septum: No defect or patent foramen ovale was identified.  F/U TEE done by me on 1/13 showed no flail but severe prolapse of middle scallop of P2 with PISA 1.3 and RV of 51cc No CHF, chest pain  Occasional palpitations and PAC;s noted during TEE No history of TIA/CVA  Cath 02/08/15:  Reviewed:  Normal coronary arteries normal EF no right heart done  02/27/15 Surgical repair Roxy Manns:  Minimally-Invasive Mitral Valve Repair Complex valvuloplasty including triangular resection of posterior leaflet Artificial Gore-tex neochord  placement x8 Sorin Memo 3D Ring Annuloplasty (size 104mm, catalog # [id number], serial # [id number])  03/27/15  :  Post op echo reviewed  EF 50-55% no residual MR  Study Conclusions  - Left ventricle: Distal septal and apical hypokinesis. The cavity size was normal. Systolic function was normal. The estimated ejection fraction was in the range of 50% to 55%. Wall motion was normal; there were no regional wall motion abnormalities. - Mitral valve: S/P complex mitral valve repair Normal diastolic gradients and no significant residual MR. - Atrial septum: No defect or patent foramen ovale was identified.  Planning a trip to [place] Therapist, nutritional and has one "spot" they are watching Takes SBE  Past Medical History:  Diagnosis Date  . Acne rosacea   . Allergic rhinitis   . BPH (benign prostatic hyperplasia)   . Heart murmur   . Hyperlipidemia   . MVP (mitral valve prolapse)   . S/P minimally invasive mitral valve repair 02/27/2015   Complex valvuloplasty including triangular resection of flail segment of posterior leaflet, artificial Gore-tex neochord placement x8 and 34 mm Sorin Memo 3D Rechord ring annuloplasty  . Sensory - neural hearing loss   . Severe mitral regurgitation 10/05/2014  . Vasomotor rhinitis     Past Surgical History:  Procedure Laterality Date  . CARDIAC CATHETERIZATION N/A 02/08/2015   Procedure: Left Heart Cath And Coronary Angiography;  Surgeon: Andrew Hector, MD;  Location: [facility];  Service: Cardiovascular;  Laterality: N/A;  . COLONOSCOPY  2007   Dr. Henrene Pastor  .  HAND SURGERY  2001   right  . MITRAL VALVE REPAIR Right 02/27/2015   Procedure: MINIMALLY INVASIVE MITRAL VALVE REPAIR (MVR);  Surgeon: Andrew Alberts, MD;  Location: [facility];  Service: Open Heart Surgery;  Laterality: Right;  . SEPTOPLASTY  1998  . TEE WITHOUT CARDIOVERSION N/A 10/03/2014   Procedure: TRANSESOPHAGEAL ECHOCARDIOGRAM (TEE);  Surgeon: Andrew Hector, MD;  Location: [facility];  Service: Cardiovascular;  Laterality: N/A;  . TEE WITHOUT CARDIOVERSION N/A 02/27/2015   Procedure: TRANSESOPHAGEAL ECHOCARDIOGRAM (TEE);  Surgeon: Andrew Alberts, MD;  Location: [facility];  Service: Open Heart Surgery;  Laterality: N/A;  . undescended testicle  1951  . wart removal  1997   cryo surgery (penis)  . WISDOM TOOTH EXTRACTION  1994     Current Outpatient Prescriptions  Medication Sig Dispense Refill  . amoxicillin (AMOXIL) 500 MG capsule Take 4 capsules (2,000 mg total) by mouth once. 4 capsule 1  . aspirin EC 81 MG tablet Take 81 mg by mouth daily before supper.    Marland Kitchen atorvastatin (LIPITOR) 10 MG tablet Take 1 tablet (10 mg total) by mouth daily. 90 tablet 3  . Eyelid Cleansers (OCUSOFT LID SCRUB EX) Apply topically 2 (two) times daily.     . finasteride (PROSCAR) 5 MG tablet Take 1 tablet (5 mg total) by mouth daily. 90 tablet 3  . fluticasone (VERAMYST) 27.5 MCG/SPRAY nasal spray Place 2 sprays into the nose daily as needed for rhinitis. Reported on 10/11/2015    . GLUCOSAMINE-CHONDROITIN PO Take 1 tablet by mouth daily. Glucosamine 1500 mg/ chrondroiton 1200 mg    . ketotifen (ZADITOR) 0.025 % ophthalmic solution Apply 1 drop to eye daily as needed (Itchy Eyes).    Marland Kitchen LEVITRA 10 MG tablet TAKE 1 TABLET (10 MG TOTAL) BY MOUTH DAILY AS NEEDED FOR ERECTILE DYSFUNCTION. 10 tablet 3  . Multiple Vitamins-Minerals (MULTIVITAMIN WITH MINERALS) tablet Take 1 tablet by mouth daily.    . Omega-3 Fatty Acids (FISH OIL) 1200 MG CAPS Take 1 capsule by mouth at bedtime.    . Pseudoephedrine HCl (SUDAFED 24 HOUR PO) Take 1 tablet by mouth daily as needed (allergies). Reported on 10/11/2015     No current facility-administered medications for this visit.     Allergies:   Review of patient's allergies indicates no known allergies.    Social History:  The patient  reports that he has never smoked. He has never used smokeless tobacco. He reports that he drinks alcohol. He reports that  he does not use drugs.   Family History:  The patient's family history includes Colon cancer (age of onset: 80) in his paternal uncle; Colon cancer (age of onset: 79) in his paternal uncle; Heart attack in his father; Heart failure in his mother; Stroke in his sister.    ROS:  Please see the history of present illness.   Otherwise, review of systems are positive for none.   All other systems are reviewed and negative.    PHYSICAL EXAM: VS:  BP 100/70   Pulse 62   Ht 6\' 3"  (1.905 m)   Wt 91.4 kg (201 lb 6.4 oz)   SpO2 94%   BMI 25.17 kg/m  , BMI Body mass index is 25.17 kg/m. GEN: Well nourished, well developed, in no acute distress HEENT: normal Neck: no JVD, carotid bruits, or masses Cardiac: RRR; no   murmurs, rubs, or gallops,no edema  Minimally invasive scar over right aereolar area Respiratory:  clear to auscultation bilaterally,  normal work of breathing GI: soft, nontender, nondistended, + BS MS: no deformity or atrophy Skin: warm and dry, no rash Neuro:  Strength and sensation are intact Psych: euthymic mood, full affect   EKG:   10/05/14  EKG  SR rate 56 normal  06/18/16 SR rate 62 normal     Recent Labs: 10/11/2015: ALT 17; BUN 17; Creat 0.92; Hemoglobin 15.3; Platelets 181; Potassium 4.1; Sodium 139    Lipid Panel    Component Value Date/Time   CHOL 175 10/11/2015 0001   TRIG 88 10/11/2015 0001   HDL 42 10/11/2015 0001   CHOLHDL 4.2 10/11/2015 0001   VLDL 18 10/11/2015 0001   LDLCALC 115 10/11/2015 0001      Wt Readings from Last 3 Encounters:  06/18/16 91.4 kg (201 lb 6.4 oz)  03/09/16 89.8 kg (198 lb)  10/11/15 90.4 kg (199 lb 3.2 oz)      Other studies Reviewed: Additional studies/ records that were reviewed today include: TEE/TTE., Cath Intraop TEE and OR report    Plan:  MV repair:   Intact no residual leak EF 50-55% post op SBE discussed Chol:  Cholesterol is at goal.  Continue current dose of statin and diet Rx.  No myalgias or side  effects.  F/U  LFT's in 6 months. Lab Results  Component Value Date   [facility] 115 10/11/2015           F/U in a year  Signed, Jenkins Rouge, MD  06/18/2016 10:01 AM    [facility] [address], [place], [place]  [address] Phone: [phone]; Fax: [phone]

## 2016-06-18 ENCOUNTER — Ambulatory Visit (INDEPENDENT_AMBULATORY_CARE_PROVIDER_SITE_OTHER): Payer: Medicare Other | Admitting: Cardiovascular Disease

## 2016-06-18 ENCOUNTER — Encounter (INDEPENDENT_AMBULATORY_CARE_PROVIDER_SITE_OTHER): Payer: Self-pay

## 2016-06-18 ENCOUNTER — Encounter: Payer: Self-pay | Admitting: Cardiovascular Disease

## 2016-06-18 VITALS — BP 100/70 | HR 62 | Ht 75.0 in | Wt 201.4 lb

## 2016-06-18 DIAGNOSIS — I340 Nonrheumatic mitral (valve) insufficiency: Secondary | ICD-10-CM

## 2016-06-18 DIAGNOSIS — Z79899 Other long term (current) drug therapy: Secondary | ICD-10-CM | POA: Diagnosis not present

## 2016-06-18 NOTE — Patient Instructions (Addendum)

## 2016-08-17 IMAGING — CR DG CHEST 2V
2 series · 2 of 2 positions shown · non-contrast
Comparison: CT 02/19/2015

CLINICAL DATA: 68-year-old male with a history of mitral
regurgitation. Upcoming of mitral valve repair.

EXAM:
CHEST - 2 VIEW

[w chest pa]
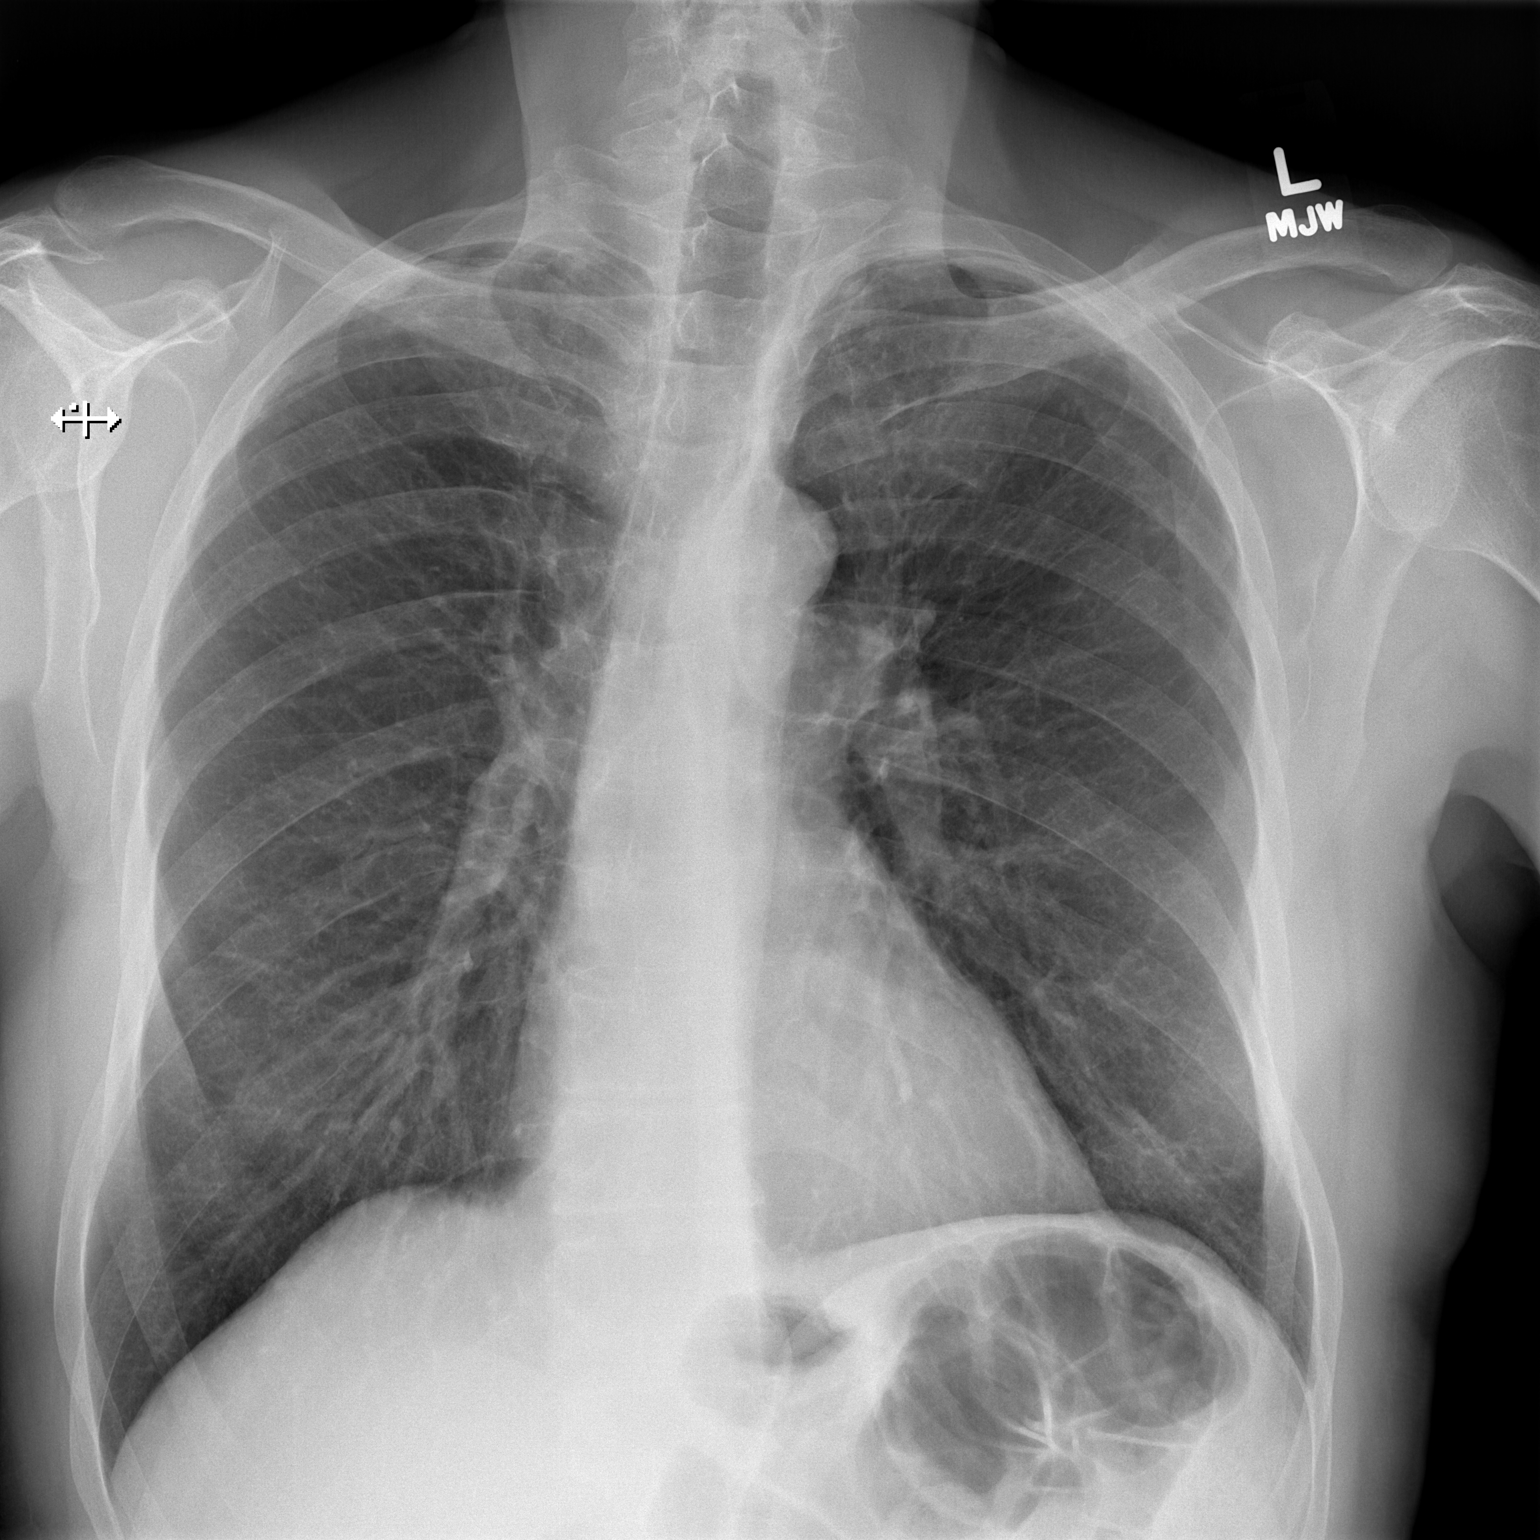

[w chest lat]
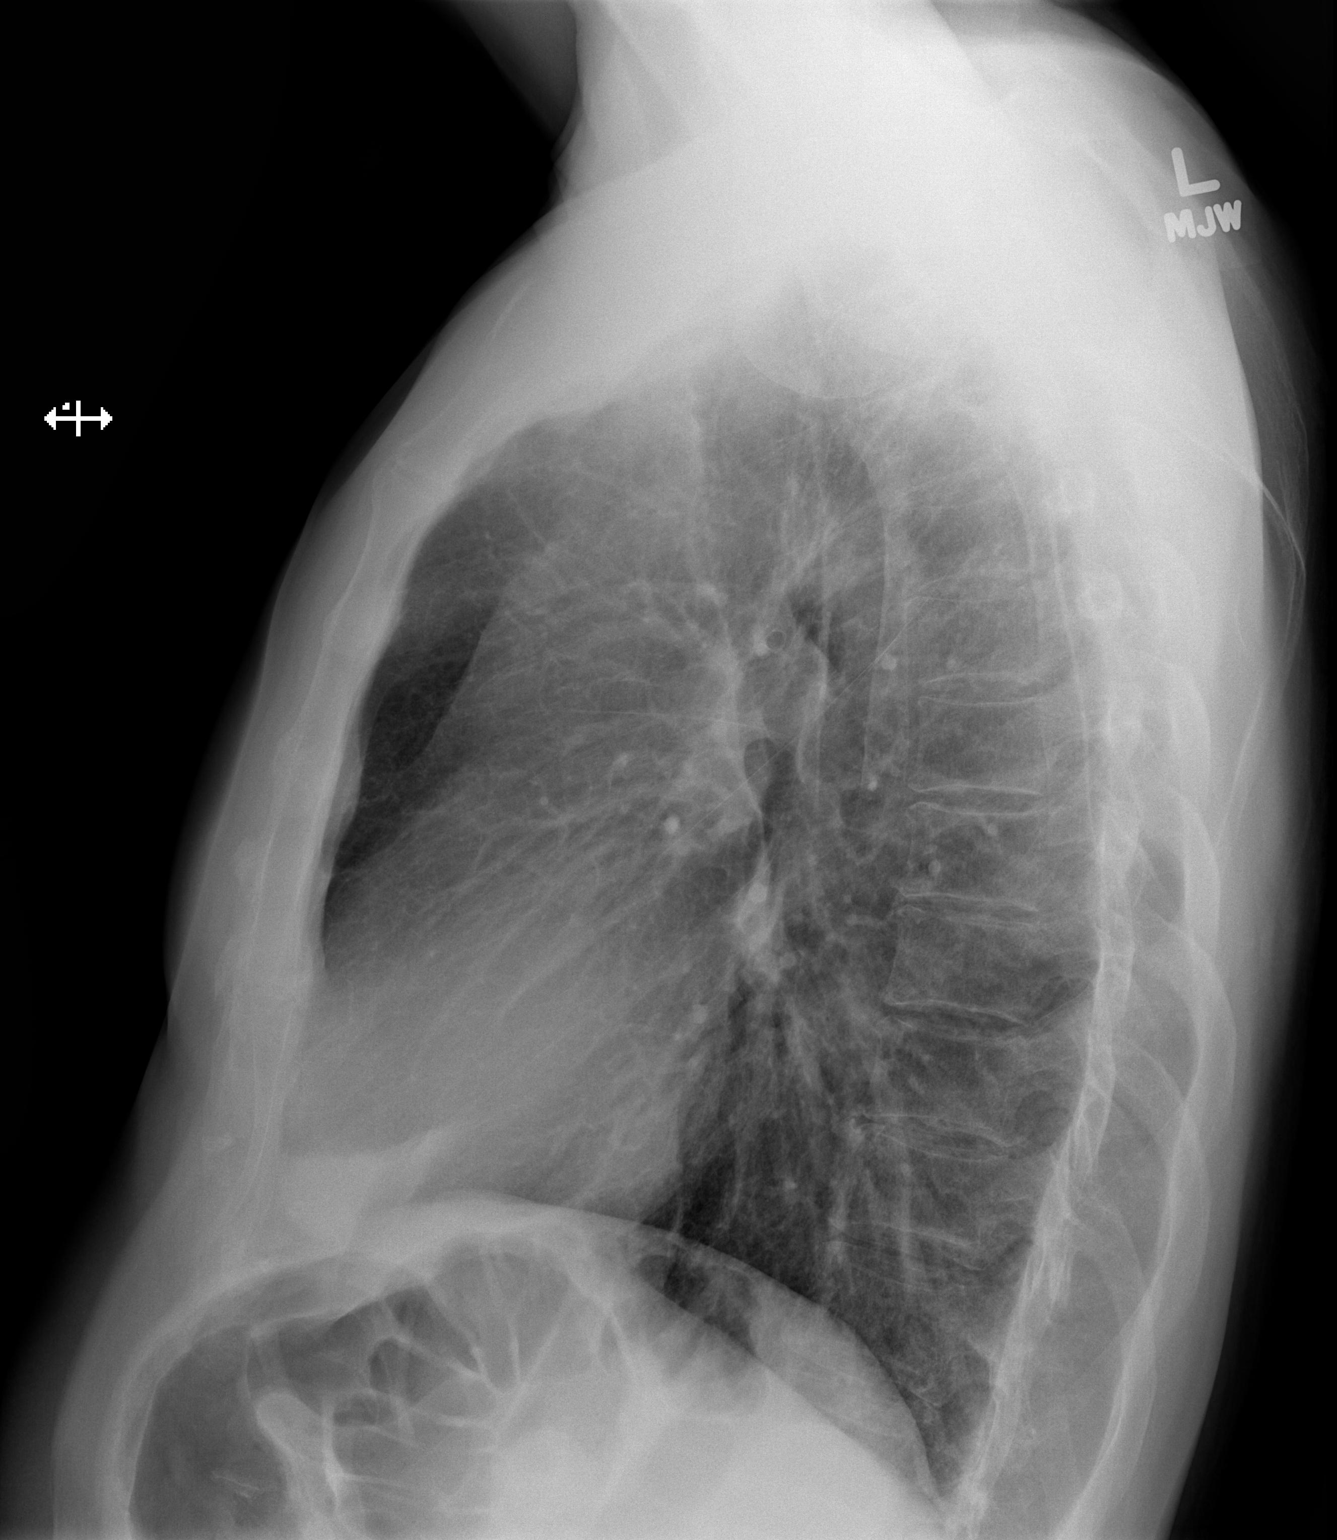

[2 of 2 positions shown; findings below may reference images not displayed]

FINDINGS: Cardiomediastinal silhouette within normal limits in size and
contour. No pneumothorax. No pleural effusion. No confluent airspace
disease.

Coarsened interstitial markings, similar to the scout plain film
from the prior chest CT.

No displaced fracture.

Unremarkable appearance of the upper abdomen.
IMPRESSION: No radiographic evidence of acute cardiopulmonary disease.

## 2016-08-19 IMAGING — CR DG CHEST 1V PORT
1 series · 1 of 1 positions shown · non-contrast
Comparison: Radiograph 02/25/2015

CLINICAL DATA: Status post mitral valve replacement.

EXAM:
PORTABLE CHEST - 1 VIEW

[AP]
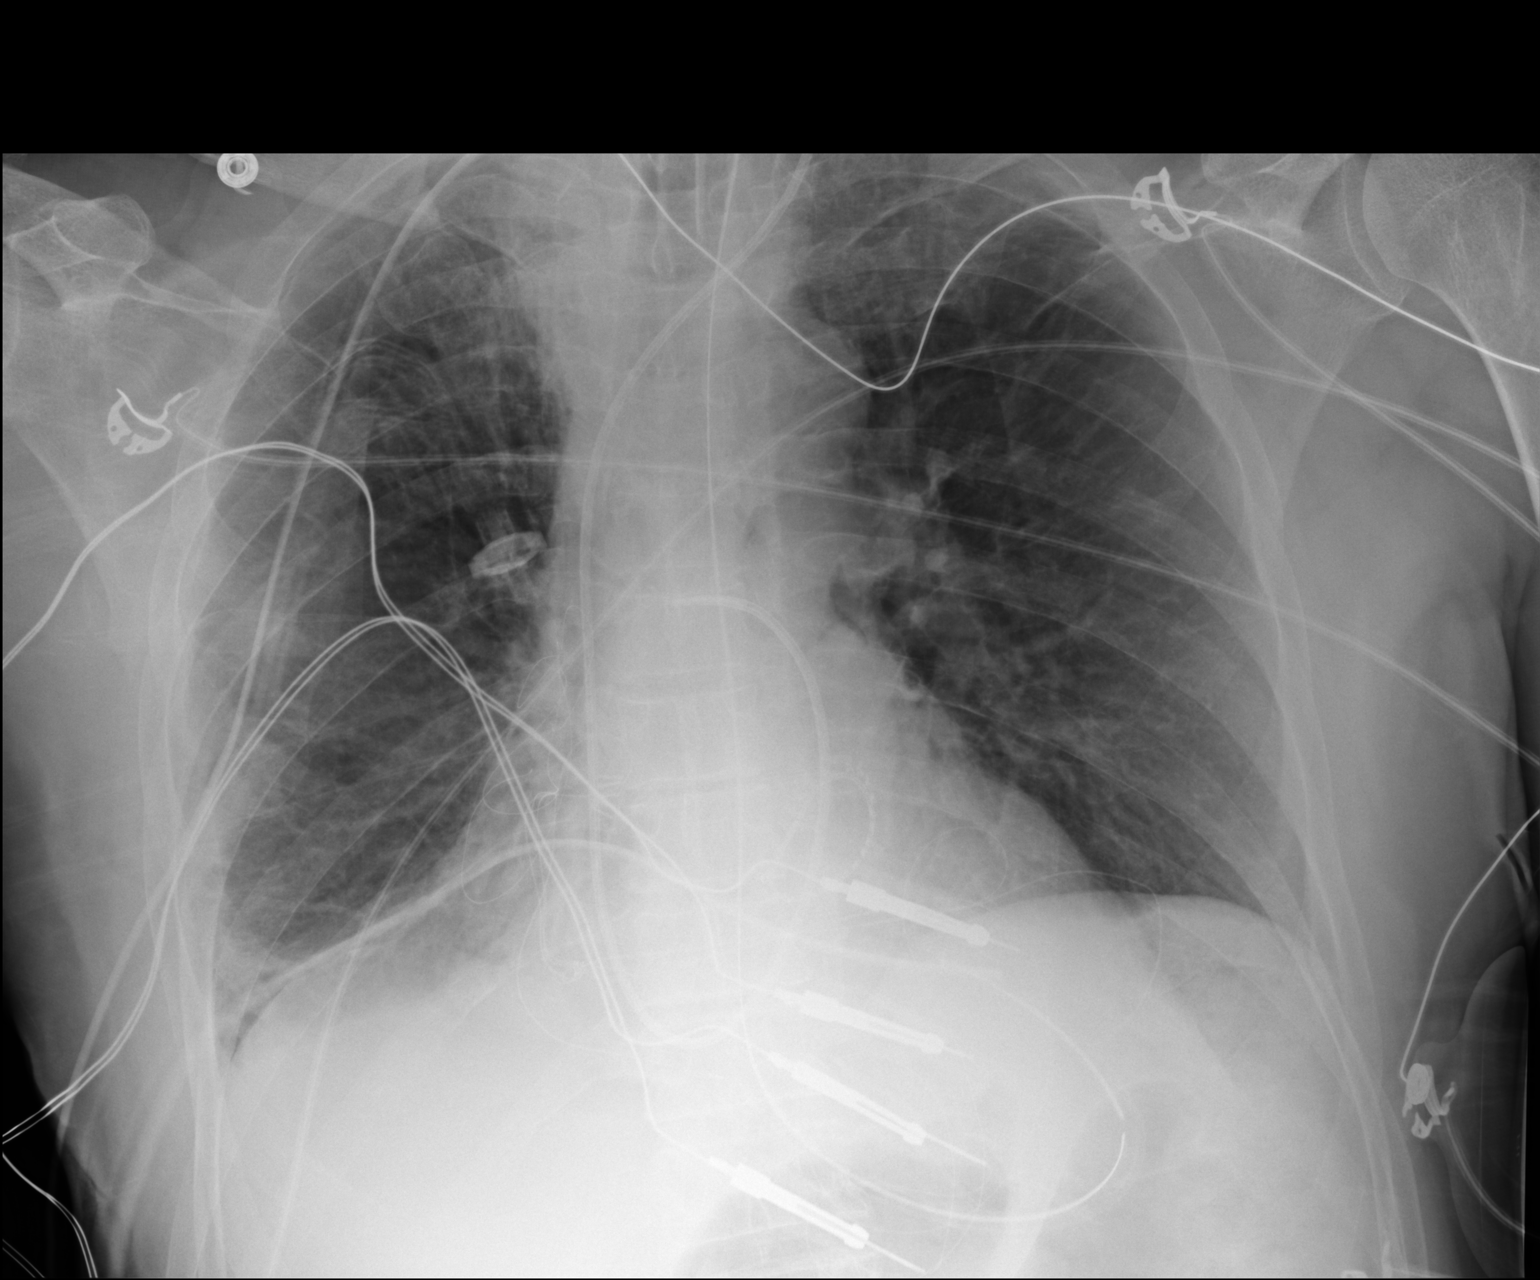

[1 of 1 positions shown; findings below may reference images not displayed]

FINDINGS: The endotracheal tube is 5 cm from the carina. NG tube in stomach.
Swan-Ganz catheter in the proximal right main pulmonary artery.
Normal cardiac silhouette. Chest tube at the right lung apex. There
is mild basilar atelectasis. No pulmonary edema. New mild
interstitial markings. No pneumothorax.
IMPRESSION: 1. Support apparatus appears in proper position.
2. No pulmonary edema or pneumothorax.
3. New interstitial edema.

## 2016-08-20 IMAGING — CR DG CHEST 1V PORT
1 series · 1 of 1 positions shown · non-contrast
Comparison: 02/27/2015 .

CLINICAL DATA: Cardiac valve repair.

EXAM:
PORTABLE CHEST - 1 VIEW

[AP]
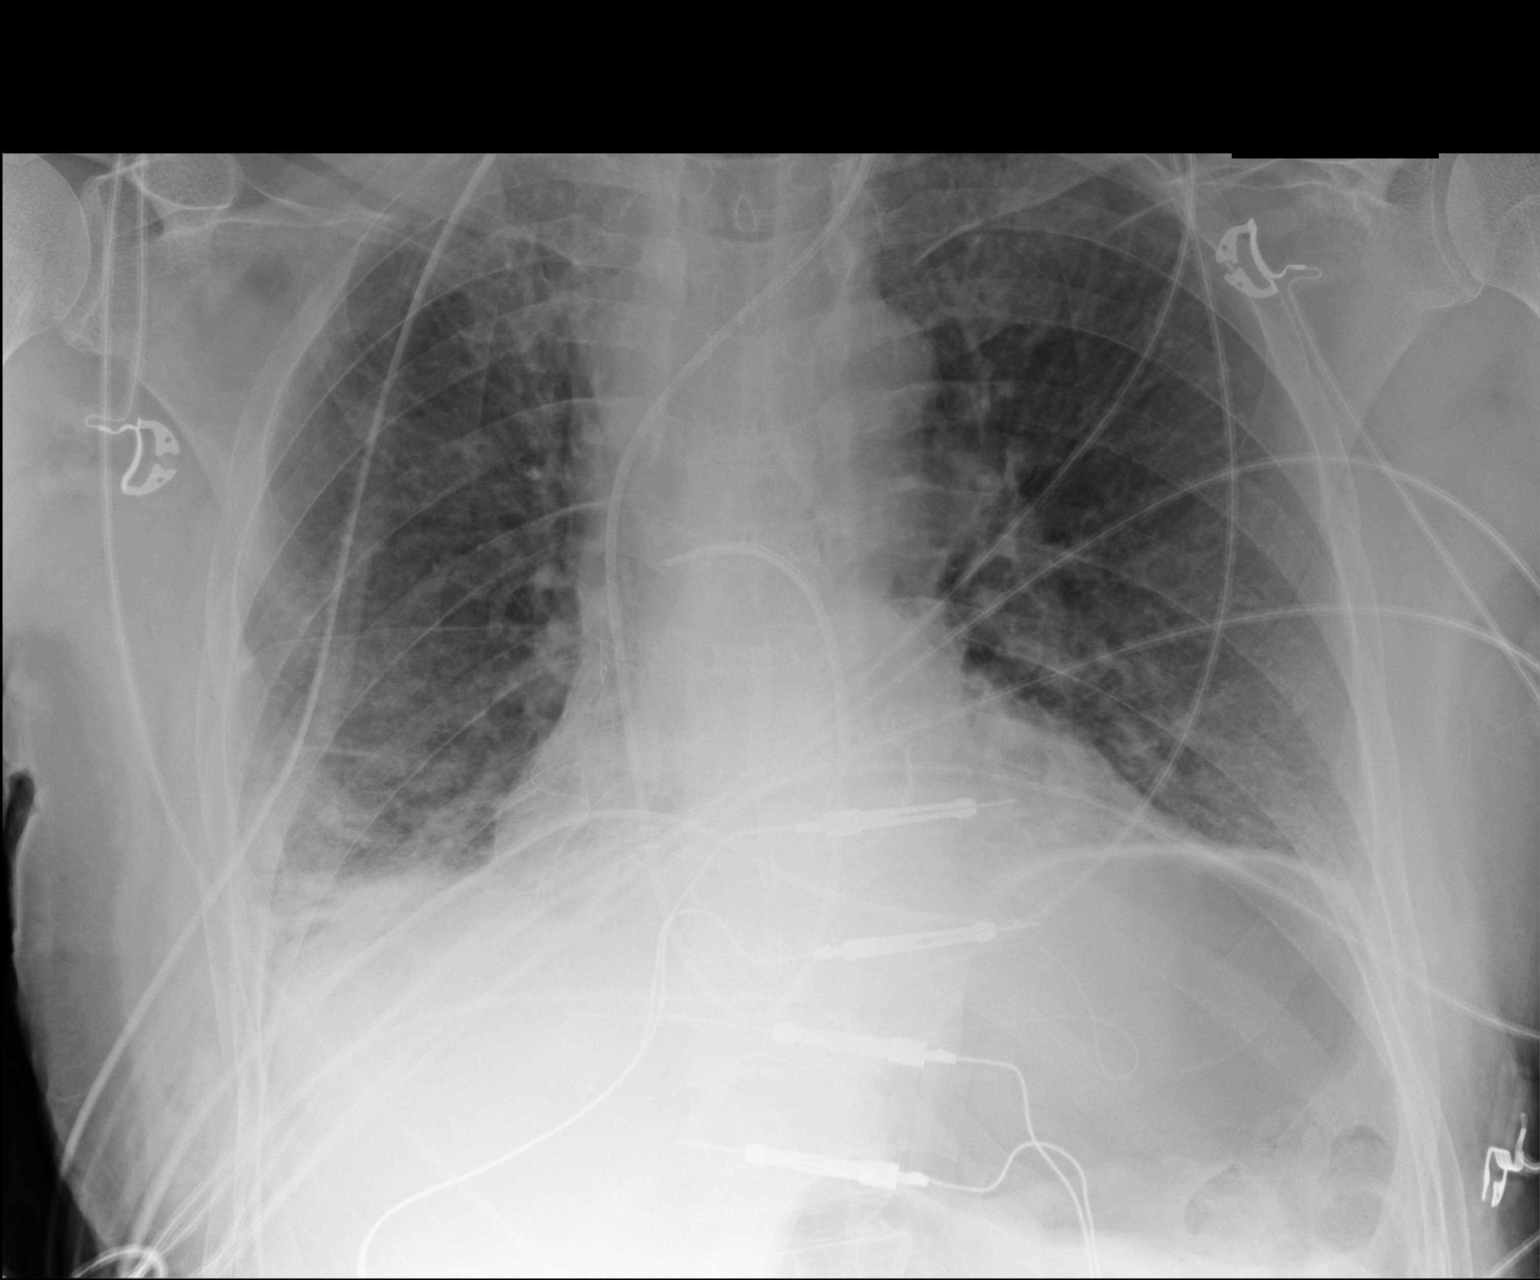

[1 of 1 positions shown; findings below may reference images not displayed]

FINDINGS: Interim extubation and removal of NG tube. Right chest tube in
stable position. Swan-Ganz catheter stable position. Mediastinum
hilar structures are stable. Stable cardiomegaly with prior cardiac
valve replacement. Mild bilateral interstitial prominence and small
right pleural effusion. Mild congestive heart failure cannot be
excluded. No pneumothorax. Mild gastric distention.
IMPRESSION: 1. Interim extubation removal of NG tube. Right chest tube and
Swan-Ganz catheter stable position.
2. Stable cardiomegaly with prior cardiac valve replacement.
Bilateral mild interstitial prominence and small right pleural
effusion noted. Mild congestive heart failure cannot be excluded.
3. Mild gastric distention.

## 2016-08-21 IMAGING — CR DG CHEST 1V PORT
1 series · 1 of 1 positions shown · non-contrast
Comparison: Portable chest x-ray February 28, 2015

CLINICAL DATA: Status post minimally invasive mitral valve repair

EXAM:
PORTABLE CHEST - 1 VIEW

[AP]
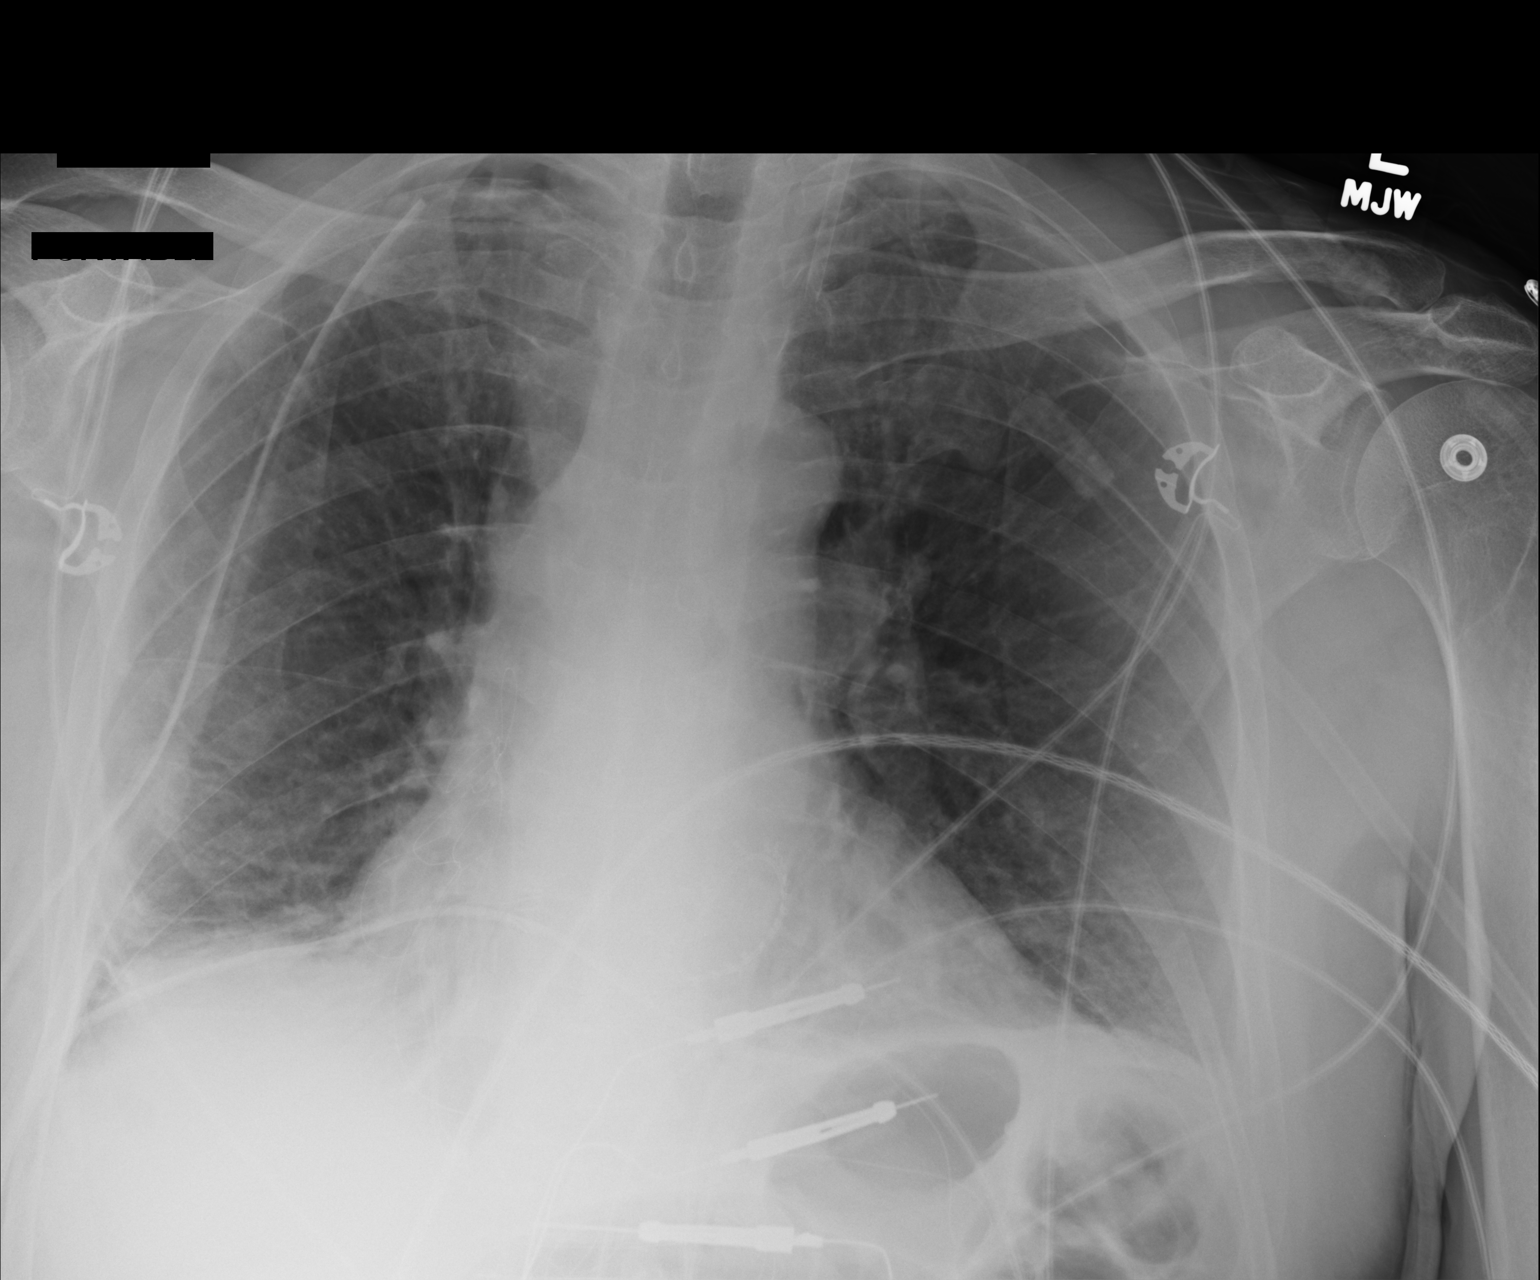

[1 of 1 positions shown; findings below may reference images not displayed]

FINDINGS: The lungs are adequately inflated. There is persistent subsegmental
atelectasis at the right lung base. No pneumothorax is evident. The
chest tube and mediastinal drain are unchanged in position. The
cardiac silhouette is mildly enlarged. The central pulmonary
vascularity is prominent, but the cephalization has decreased. The
radiodense ring of the repaired mitral valve is visible. The left
internal jugular Cordis sheath tip projects over the distal portion
of the left internal jugular vein. The Swan-Ganz catheter has been
removed.
IMPRESSION: 1. Improving pulmonary interstitium consistent with resolving
pulmonary edema.
2. The right chest tube and the mediastinal drain are stable. There
is no pneumothorax or significant pleural effusion. There is
persistent mild right basilar subsegmental atelectasis.

## 2016-11-20 ENCOUNTER — Ambulatory Visit (INDEPENDENT_AMBULATORY_CARE_PROVIDER_SITE_OTHER): Payer: Medicare Other | Admitting: Family Medicine

## 2016-11-20 ENCOUNTER — Encounter: Payer: Self-pay | Admitting: Family Medicine

## 2016-11-20 VITALS — BP 118/70 | HR 57 | Ht 75.0 in | Wt 201.6 lb

## 2016-11-20 DIAGNOSIS — Z9889 Other specified postprocedural states: Secondary | ICD-10-CM | POA: Diagnosis not present

## 2016-11-20 DIAGNOSIS — N529 Male erectile dysfunction, unspecified: Secondary | ICD-10-CM

## 2016-11-20 DIAGNOSIS — Z125 Encounter for screening for malignant neoplasm of prostate: Secondary | ICD-10-CM

## 2016-11-20 DIAGNOSIS — N401 Enlarged prostate with lower urinary tract symptoms: Secondary | ICD-10-CM

## 2016-11-20 DIAGNOSIS — Z86010 Personal history of colonic polyps: Secondary | ICD-10-CM

## 2016-11-20 DIAGNOSIS — J309 Allergic rhinitis, unspecified: Secondary | ICD-10-CM

## 2016-11-20 DIAGNOSIS — E785 Hyperlipidemia, unspecified: Secondary | ICD-10-CM | POA: Diagnosis not present

## 2016-11-20 LAB — LIPID PANEL
CHOLESTEROL: 150 mg/dL (ref <200)
HDL: 37 mg/dL — ABNORMAL LOW (ref >40)
LDL Cholesterol: 86 mg/dL (ref <100)
Total CHOL/HDL Ratio: 4.1 Ratio (ref <5.0)
Triglycerides: 134 mg/dL (ref <150)
VLDL: 27 mg/dL (ref <30)

## 2016-11-20 LAB — CBC WITH DIFFERENTIAL/PLATELET
BASOS PCT: 1 %
Basophils Absolute: 56 cells/uL (ref 0–200)
EOS ABS: 224 {cells}/uL (ref 15–500)
EOS PCT: 4 %
HCT: 44.4 % (ref 38.5–50.0)
Hemoglobin: 15.3 g/dL (ref 13.2–17.1)
Lymphocytes Relative: 33 %
Lymphs Abs: 1848 cells/uL (ref 850–3900)
MCH: 33.3 pg — ABNORMAL HIGH (ref 27.0–33.0)
MCHC: 34.5 g/dL (ref 32.0–36.0)
MCV: 96.7 fL (ref 80.0–100.0)
MONOS PCT: 11 %
MPV: 10.9 fL (ref 7.5–12.5)
Monocytes Absolute: 616 cells/uL (ref 200–950)
NEUTROS ABS: 2856 {cells}/uL (ref 1500–7800)
Neutrophils Relative %: 51 %
PLATELETS: 171 10*3/uL (ref 140–400)
RBC: 4.59 MIL/uL (ref 4.20–5.80)
RDW: 12.8 % (ref 11.0–15.0)
WBC: 5.6 10*3/uL (ref 4.0–10.5)

## 2016-11-20 LAB — COMPREHENSIVE METABOLIC PANEL
ALK PHOS: 64 U/L (ref 40–115)
ALT: 15 U/L (ref 9–46)
AST: 19 U/L (ref 10–35)
Albumin: 4.3 g/dL (ref 3.6–5.1)
BUN: 15 mg/dL (ref 7–25)
CO2: 29 mmol/L (ref 20–31)
CREATININE: 1.06 mg/dL (ref 0.70–1.25)
Calcium: 9 mg/dL (ref 8.6–10.3)
Chloride: 105 mmol/L (ref 98–110)
Glucose, Bld: 87 mg/dL (ref 65–99)
Potassium: 4.4 mmol/L (ref 3.5–5.3)
SODIUM: 140 mmol/L (ref 135–146)
TOTAL PROTEIN: 6.8 g/dL (ref 6.1–8.1)
Total Bilirubin: 0.9 mg/dL (ref 0.2–1.2)

## 2016-11-20 LAB — PSA: PSA: 0.1 ng/mL (ref <=4.0)

## 2016-11-20 MED ORDER — ATORVASTATIN CALCIUM 10 MG PO TABS: 10.0000 mg | ORAL_TABLET | Freq: Every day | ORAL | 3 refills | Status: DC

## 2016-11-20 MED ORDER — FINASTERIDE 5 MG PO TABS: 5.0000 mg | ORAL_TABLET | Freq: Every day | ORAL | 3 refills | Status: DC

## 2016-11-20 MED ORDER — TADALAFIL 5 MG PO TABS: 5.0000 mg | ORAL_TABLET | Freq: Every day | ORAL | 11 refills | Status: DC | PRN

## 2016-11-20 NOTE — Progress Notes (Signed)
Subjective:   HPI  Andrew Ayala is a 71 y.o. male who presents for a complete physical.  Medical care team includes:  Dr.Bell dentist  Dr.Lupton Derm.  Dr.Oman opth  Dr.Nishen cardio.  Dr.Perry GI     Preventative care: Last ophthalmology visit:4 years Last dental visit: 1/18 Last colonoscopy:04/04/12 Last prostate exam: 16 Last EKG:06/18/16 Last labs:10/11/15  Prior vaccinations: TD or Tdap:05/27/07 Influenza: 06/18/16 Pneumococcal:23:05/17/06 13:08/25/13 Shingles/Zostavax: 08/24/08  Advanced directive:Yes Concerns:He does have a lesion behind his left ear that he has concerns about. He also has had difficulty with nocturia x3 and in the past had  been placed on terazosin. He continues on finasteride. He also uses Levitra for erectile dysfunction and states that this is becoming difficult to use due to interfering with the 2 planned sexual activity. He gave his record also indicates he is in need of a colonoscopy. He plans to have this done after he returns from a trip to [place] this spring. He does have underlying allergies and is having some difficulty with nasal congestion. He is not using his steroid inhaler regularly. Continues on Lipitor without trouble. He has a history of MVP but has had a mitral valve repair. He is doing quite nicely after this.  Reviewed their medical, surgical, family, social, medication, and allergy history and updated chart as appropriate.    Review of Systems Negative except as above    Objective:   Physical Exam   General appearance: alert, no distress, WD/WN,  Skin:Slightly shiny pearly lesion noted behind the left ear. HEENT: normocephalic, conjunctiva/corneas normal, sclerae anicteric, PERRLA, EOMi, nares patent, no discharge or erythema, pharynx normal Oral cavity: MMM, tongue normal, teeth normal Neck: supple, no lymphadenopathy, no thyromegaly, no masses, normal ROM Chest: non tender, normal shape and expansion Heart: RRR,  normal S1, S2, no murmurs Lungs: CTA bilaterally, no wheezes, rhonchi, or rales Abdomen: +bs, soft, non tender, non distended, no masses, no hepatomegaly, no splenomegaly, no bruits Back: non tender, normal ROM, no scoliosis Musculoskeletal: upper extremities non tender, no obvious deformity, normal ROM throughout, lower extremities non tender, no obvious deformity, normal ROM throughout Extremities: no edema, no cyanosis, no clubbing Pulses: 2+ symmetric, upper and lower extremities, normal cap refill Neurological: alert, oriented x 3, CN2-12 intact, strength normal upper extremities and lower extremities, sensation normal throughout, DTRs 2+ throughout, no cerebellar signs, gait normal Psychiatric: normal affect, behavior normal, pleasant     Assessment and Plan :   S/P minimally invasive mitral valve repair - Plan: CBC with Differential/Platelet, Comprehensive metabolic panel, Lipid panel  Benign prostatic hyperplasia with lower urinary tract symptoms, symptom details unspecified - Plan: tadalafil (CIALIS) 5 MG tablet, finasteride (PROSCAR) 5 MG tablet  Hyperlipidemia LDL goal <100 - Plan: Lipid panel, atorvastatin (LIPITOR) 10 MG tablet  Allergic rhinitis, mild - Plan: CBC with Differential/Platelet, Comprehensive metabolic panel  Hx of colonic polyps - Plan: CBC with Differential/Platelet, Comprehensive metabolic panel  Erectile dysfunction, unspecified erectile dysfunction type - Plan: CBC with Differential/Platelet, Comprehensive metabolic panel  Screening for prostate cancer - Plan: PSA He will stop taking the Levitra and switch to Cialis. We will check to see if his insurance will cover this. Recommend he start using his nasal steroid spray again. He will set up an appointment to see his gastroenterologist when he returns from [place]. Also explained that erectile dysfunction might be helped by the Cialis that he will be using for his BPH. He will continue on his other medications.  He will follow-up with  dermatology concerning the skin lesion. At the end of the encounter he then mentioned clicks sensation in his left hip and knee. He has been walking his new dog recently. I explained that since this just started a day or 2 ago, we will watch this    Physical exam - discussed healthy lifestyle, diet, exercise, preventative care, vaccinations, and addressed their concerns.    Follow-up as needed

## 2016-11-20 NOTE — Patient Instructions (Signed)
Try either Flonase or Rhinocort

## 2016-11-27 ENCOUNTER — Telehealth: Payer: Self-pay | Admitting: Family Medicine

## 2016-11-27 MED ORDER — SILDENAFIL CITRATE 20 MG PO TABS: ORAL_TABLET | ORAL | 5 refills | Status: DC

## 2016-11-27 NOTE — Telephone Encounter (Signed)
Pt left message and requests change to Sildenafil 20mg  #30 with [facility] goldcard [facility] cost is only $24.25

## 2016-12-10 ENCOUNTER — Telehealth: Payer: Self-pay | Admitting: Family Medicine

## 2016-12-10 MED ORDER — TAMSULOSIN HCL 0.4 MG PO CAPS: 0.4000 mg | ORAL_CAPSULE | Freq: Every day | ORAL | 3 refills | Status: DC

## 2016-12-10 NOTE — Telephone Encounter (Signed)
I will call in tamsulosin. Apparently his insurance will not cover for Cialis.

## 2016-12-10 NOTE — Telephone Encounter (Signed)
Pt called back after calling his insurance company and was wants to try Tamsulosin, he wants Rx sent in for 90 days for $18 to [facility]

## 2016-12-10 NOTE — Telephone Encounter (Signed)
Pt called & wants to get something for frequent urination during the night to [facility].  States Cialis was too expensive.  I asked him to call his insurance company & find out what is on his formulary and call back.

## 2017-01-07 DIAGNOSIS — L821 Other seborrheic keratosis: Secondary | ICD-10-CM | POA: Diagnosis not present

## 2017-01-07 DIAGNOSIS — L570 Actinic keratosis: Secondary | ICD-10-CM | POA: Diagnosis not present

## 2017-01-07 DIAGNOSIS — L218 Other seborrheic dermatitis: Secondary | ICD-10-CM | POA: Diagnosis not present

## 2017-01-07 DIAGNOSIS — L309 Dermatitis, unspecified: Secondary | ICD-10-CM | POA: Diagnosis not present

## 2017-01-25 ENCOUNTER — Encounter: Payer: Self-pay | Admitting: Internal Medicine

## 2017-02-25 DIAGNOSIS — L570 Actinic keratosis: Secondary | ICD-10-CM | POA: Diagnosis not present

## 2017-02-25 DIAGNOSIS — L700 Acne vulgaris: Secondary | ICD-10-CM | POA: Diagnosis not present

## 2017-02-25 DIAGNOSIS — L820 Inflamed seborrheic keratosis: Secondary | ICD-10-CM | POA: Diagnosis not present

## 2017-03-04 ENCOUNTER — Other Ambulatory Visit: Payer: Self-pay | Admitting: *Deleted

## 2017-03-04 DIAGNOSIS — I38 Endocarditis, valve unspecified: Secondary | ICD-10-CM

## 2017-03-04 MED ORDER — AMOXICILLIN 500 MG PO CAPS: ORAL_CAPSULE | ORAL | 1 refills | Status: DC

## 2017-03-04 MED ORDER — AMOXICILLIN 500 MG PO CAPS: 2000.0000 mg | ORAL_CAPSULE | Freq: Once | ORAL | 1 refills | Status: DC

## 2017-03-31 ENCOUNTER — Other Ambulatory Visit: Payer: Self-pay | Admitting: Family Medicine

## 2017-03-31 NOTE — Telephone Encounter (Signed)
Is this okay to refill? 

## 2017-04-23 ENCOUNTER — Encounter: Payer: Self-pay | Admitting: Internal Medicine

## 2017-05-12 DIAGNOSIS — Z23 Encounter for immunization: Secondary | ICD-10-CM | POA: Diagnosis not present

## 2017-06-25 ENCOUNTER — Ambulatory Visit (AMBULATORY_SURGERY_CENTER): Payer: Self-pay | Admitting: *Deleted

## 2017-06-25 VITALS — Ht 75.5 in | Wt 202.4 lb

## 2017-06-25 DIAGNOSIS — Z86010 Personal history of colonic polyps: Secondary | ICD-10-CM

## 2017-06-25 MED ORDER — NA SULFATE-K SULFATE-MG SULF 17.5-3.13-1.6 GM/177ML PO SOLN: 1.0000 [IU] | Freq: Once | ORAL | 0 refills | Status: AC

## 2017-06-25 NOTE — Progress Notes (Signed)
No egg or soy allergy known to patient  No issues with past sedation with any surgeries  or procedures, no intubation problems  No diet pills per patient No home 02 use per patient  No blood thinners per patient  Pt denies issues with constipation  No A fib or A flutter  EMMI video sent to pt's e mail  Pt. Declined has had x 3 times

## 2017-06-30 ENCOUNTER — Telehealth: Payer: Self-pay | Admitting: Internal Medicine

## 2017-06-30 NOTE — Telephone Encounter (Signed)
Pt states that his prep is $91.00.   He has medicare A/B/C/D.   He wants the generic suprep.   Informed him that there is no generic suprep, pharmacy told him there were alternatives. Informed these are similar to golytelty  128 ounces and he would have to drink 64 oz night before and morning of  with 16 oz water following each or we could do miralax with different instrcutions or sample suprep.  He will get sample of suprep today   Lelan Pons PV   Samples of this drug were given to the patient, quantity suprep 1 kit , Lot Number [id number]  Exp 7-20, use as directed

## 2017-07-02 NOTE — Progress Notes (Signed)
Patient ID: Andrew Ayala, male   DOB: [birth year], 71 y.o.   MRN: [id number]    Cardiology Office Note   Date:  07/06/2017   ID:  Andrew Ayala, DOB 12/18/1945, MRN [id number]  PCP:  Denita Lung, MD  Cardiologist:   Jenkins Rouge, MD   No chief complaint on file.     History of Present Illness: Andrew Ayala is a 71 y.o. male who presents for f/u MV repair 2013 transferred care from [facility]  TTE showed bi-leaflet Prolapse with severe MR. Cath 02/08/15 normal cors and right heart pressures severe MR  02/27/15 Surgical repair Roxy Manns:  Minimally-Invasive Mitral Valve Repair Complex valvuloplasty including triangular resection of posterior leaflet Artificial Gore-tex neochord placement x8 Sorin Memo 3D Ring Annuloplasty (size 65mm, catalog # [id number], serial # [id number])  03/27/15  :  Post op echo reviewed  EF 50-55% no residual MR  Study Conclusions  - Left ventricle: Distal septal and apical hypokinesis. The cavity size was normal. Systolic function was normal. The estimated ejection fraction was in the range of 50% to 55%. Wall motion was normal; there were no regional wall motion abnormalities. - Mitral valve: S/P complex mitral valve repair Normal diastolic gradients and no significant residual MR. - Atrial septum: No defect or patent foramen ovale was identified.  Had a nice trip to [place] Sees dentist regularly  Has a sister with two adopted ADHD kids. Another sister who had a stroke Son is a Teacher, music in [place] and his male partner are thinking of adopting  Past Medical History:  Diagnosis Date  . Acne rosacea   . Allergic rhinitis   . Allergy   . BPH (benign prostatic hyperplasia)   . GERD (gastroesophageal reflux disease)    no meds/some reflux/takes tums  . Heart murmur   . Hyperlipidemia   . MVP (mitral valve prolapse)    repaired  . S/P minimally invasive mitral valve repair 02/27/2015   Complex valvuloplasty including  triangular resection of flail segment of posterior leaflet, artificial Gore-tex neochord placement x8 and 34 mm Sorin Memo 3D Rechord ring annuloplasty  . Sensory - neural hearing loss   . Severe mitral regurgitation 10/05/2014  . Vasomotor rhinitis     Past Surgical History:  Procedure Laterality Date  . CARDIAC CATHETERIZATION N/A 02/08/2015   Procedure: Left Heart Cath And Coronary Angiography;  Surgeon: Josue Hector, MD;  Location: [facility];  Service: Cardiovascular;  Laterality: N/A;  . COLONOSCOPY  2007   Dr. Henrene Pastor  . HAND SURGERY  2001   right  . MITRAL VALVE REPAIR Right 02/27/2015   Procedure: MINIMALLY INVASIVE MITRAL VALVE REPAIR (MVR);  Surgeon: Rexene Alberts, MD;  Location: [facility];  Service: Open Heart Surgery;  Laterality: Right;  . POLYPECTOMY    . SEPTOPLASTY  1998  . TEE WITHOUT CARDIOVERSION N/A 10/03/2014   Procedure: TRANSESOPHAGEAL ECHOCARDIOGRAM (TEE);  Surgeon: Josue Hector, MD;  Location: [facility];  Service: Cardiovascular;  Laterality: N/A;  . TEE WITHOUT CARDIOVERSION N/A 02/27/2015   Procedure: TRANSESOPHAGEAL ECHOCARDIOGRAM (TEE);  Surgeon: Rexene Alberts, MD;  Location: [facility];  Service: Open Heart Surgery;  Laterality: N/A;  . undescended testicle  1951  . wart removal  1997   cryo surgery (penis)  . WISDOM TOOTH EXTRACTION  1994     Current Outpatient Prescriptions  Medication Sig Dispense Refill  . amoxicillin (AMOXIL) 500 MG capsule One hour before any dental or invasive procedures 4 capsule 1  . aspirin  EC 81 MG tablet Take 81 mg by mouth daily before supper.    Marland Kitchen atorvastatin (LIPITOR) 10 MG tablet Take 1 tablet (10 mg total) by mouth daily. 90 tablet 3  . Eyelid Cleansers (OCUSOFT LID SCRUB EX) Apply topically 2 (two) times daily.     . finasteride (PROSCAR) 5 MG tablet Take 1 tablet (5 mg total) by mouth daily. 90 tablet 3  . FLUZONE HIGH-DOSE 0.5 ML injection Inject as directed once.  0  . GLUCOSAMINE-CHONDROITIN PO Take 1 tablet  by mouth daily. Glucosamine 1500 mg/ chrondroiton 1200 mg    . Multiple Vitamins-Minerals (MULTIVITAMIN WITH MINERALS) tablet Take 1 tablet by mouth daily.    . Omega-3 Fatty Acids (FISH OIL) 1200 MG CAPS Take 1 capsule by mouth at bedtime.    . Pseudoephedrine HCl (SUDAFED 24 HOUR PO) Take 1 tablet by mouth daily as needed (allergies). Reported on 10/11/2015    . sildenafil (REVATIO) 20 MG tablet One to five pills as needed 30 tablet 5  . tamsulosin (FLOMAX) 0.4 MG CAPS capsule TAKE 1 CAPSULE (0.4 MG TOTAL) BY MOUTH DAILY. 90 capsule 3  . Zoster Vaccine Adjuvanted [facility]) injection Inject as directed once.  0   No current facility-administered medications for this visit.     Allergies:   Patient has no known allergies.    Social History:  The patient  reports that he has quit smoking. He has never used smokeless tobacco. He reports that he drinks alcohol. He reports that he does not use drugs.   Family History:  The patient's family history includes Colon cancer (age of onset: 44) in his paternal uncle; Colon cancer (age of onset: 38) in his paternal uncle; Heart attack in his father; Heart failure in his mother; Pancreatic cancer in his paternal grandfather and paternal grandmother; Stroke in his sister.    ROS:  Please see the history of present illness.   Otherwise, review of systems are positive for none.   All other systems are reviewed and negative.    PHYSICAL EXAM: VS:  BP (!) 86/60   Pulse 62   Ht 6' 3.5" (1.918 m)   Wt 202 lb (91.6 kg)   SpO2 98%   BMI 24.91 kg/m  , BMI Body mass index is 24.91 kg/m. Affect appropriate Healthy:  appears stated age 51: normal Neck supple with no adenopathy JVP normal no bruits no thyromegaly Lungs clear with no wheezing and good diaphragmatic motion Heart:  S1/S2 no murmur, no rub, gallop or click post mini right thoracotomy PMI normal Abdomen: benighn, BS positve, no tenderness, no AAA no bruit.  No HSM or HJR Distal pulses  intact with no bruits No edema Neuro non-focal Skin warm and dry No muscular weakness   EKG:   10/05/14  EKG  SR rate 56 normal  06/18/16 SR rate 62 normal  07/06/17 SR PVC rate 62    Recent Labs: 11/20/2016: ALT 15; BUN 15; Creat 1.06; Hemoglobin 15.3; Platelets 171; Potassium 4.4; Sodium 140    Lipid Panel    Component Value Date/Time   CHOL 150 11/20/2016 1048   TRIG 134 11/20/2016 1048   HDL 37 (L) 11/20/2016 1048   CHOLHDL 4.1 11/20/2016 1048   VLDL 27 11/20/2016 1048   LDLCALC 86 11/20/2016 1048      Wt Readings from Last 3 Encounters:  07/06/17 202 lb (91.6 kg)  06/25/17 202 lb 6.4 oz (91.8 kg)  11/20/16 201 lb 9.6 oz (91.4 kg)  Other studies Reviewed: Additional studies/ records that were reviewed today include: TEE/TTE., Cath Intraop TEE and OR report    Plan:  MV repair:   Intact no residual leak EF 50-55% post op SBE discussed Chol:  Cholesterol is at goal.  Continue current dose of statin and diet Rx.  No myalgias or side effects.  F/U  LFT's in 6 months. Lab Results  Component Value Date   LDLCALC 86 11/20/2016           F/U in a year  Signed, Jenkins Rouge, MD  07/06/2017 10:07 AM    [address], [place], [place]  [address] Phone: [phone]; Fax: [phone]

## 2017-07-06 ENCOUNTER — Ambulatory Visit (INDEPENDENT_AMBULATORY_CARE_PROVIDER_SITE_OTHER): Payer: Medicare Other | Admitting: Cardiovascular Disease

## 2017-07-06 ENCOUNTER — Encounter: Payer: Self-pay | Admitting: Cardiovascular Disease

## 2017-07-06 VITALS — BP 86/60 | HR 62 | Ht 75.5 in | Wt 202.0 lb

## 2017-07-06 DIAGNOSIS — E785 Hyperlipidemia, unspecified: Secondary | ICD-10-CM | POA: Diagnosis not present

## 2017-07-06 DIAGNOSIS — Z9889 Other specified postprocedural states: Secondary | ICD-10-CM | POA: Diagnosis not present

## 2017-07-06 NOTE — Patient Instructions (Addendum)

## 2017-07-09 ENCOUNTER — Encounter: Payer: Self-pay | Admitting: Internal Medicine

## 2017-07-09 ENCOUNTER — Ambulatory Visit (AMBULATORY_SURGERY_CENTER): Payer: Medicare Other | Admitting: Internal Medicine

## 2017-07-09 VITALS — BP 103/55 | HR 50 | Temp 98.0°F | Resp 12 | Ht 75.5 in | Wt 202.0 lb

## 2017-07-09 DIAGNOSIS — Z86010 Personal history of colonic polyps: Secondary | ICD-10-CM

## 2017-07-09 DIAGNOSIS — D122 Benign neoplasm of ascending colon: Secondary | ICD-10-CM

## 2017-07-09 DIAGNOSIS — I341 Nonrheumatic mitral (valve) prolapse: Secondary | ICD-10-CM | POA: Diagnosis not present

## 2017-07-09 DIAGNOSIS — D120 Benign neoplasm of cecum: Secondary | ICD-10-CM

## 2017-07-09 MED ORDER — SODIUM CHLORIDE 0.9 % IV SOLN: 500.0000 mL | INTRAVENOUS | Status: DC

## 2017-07-09 NOTE — Progress Notes (Signed)
Called to room to assist during endoscopic procedure.  Patient ID and intended procedure confirmed with present staff. Received instructions for my participation in the procedure from the performing physician.  

## 2017-07-09 NOTE — Patient Instructions (Signed)
YOU HAD AN ENDOSCOPIC PROCEDURE TODAY AT THE  ENDOSCOPY CENTER:   Refer to the procedure report that was given to you for any specific questions about what was found during the examination.  If the procedure report does not answer your questions, please call your gastroenterologist to clarify.  If you requested that your care partner not be given the details of your procedure findings, then the procedure report has been included in a sealed envelope for you to review at your convenience later.  YOU SHOULD EXPECT: Some feelings of bloating in the abdomen. Passage of more gas than usual.  Walking can help get rid of the air that was put into your GI tract during the procedure and reduce the bloating. If you had a lower endoscopy (such as a colonoscopy or flexible sigmoidoscopy) you may notice spotting of blood in your stool or on the toilet paper. If you underwent a bowel prep for your procedure, you may not have a normal bowel movement for a few days.  Please Note:  You might notice some irritation and congestion in your nose or some drainage.  This is from the oxygen used during your procedure.  There is no need for concern and it should clear up in a day or so.  SYMPTOMS TO REPORT IMMEDIATELY:   Following lower endoscopy (colonoscopy or flexible sigmoidoscopy):  Excessive amounts of blood in the stool  Significant tenderness or worsening of abdominal pains  Swelling of the abdomen that is new, acute  Fever of 100F or higher  For urgent or emergent issues, a gastroenterologist can be reached at any hour by calling [phone].   DIET:  We do recommend a small meal at first, but then you may proceed to your regular diet.  Drink plenty of fluids but you should avoid alcoholic beverages for 24 hours.  ACTIVITY:  You should plan to take it easy for the rest of today and you should NOT DRIVE or use heavy machinery until tomorrow (because of the sedation medicines used during the test).     FOLLOW UP: Our staff will call the number listed on your records the next business day following your procedure to check on you and address any questions or concerns that you may have regarding the information given to you following your procedure. If we do not reach you, we will leave a message.  However, if you are feeling well and you are not experiencing any problems, there is no need to return our call.  We will assume that you have returned to your regular daily activities without incident.  If any biopsies were taken you will be contacted by phone or by letter within the next 1-3 weeks.  Please call us at [phone] if you have not heard about the biopsies in 3 weeks.   Await for biopsy results to determine next repeat Colonoscopy screening Polyps (handout given) Hemorrhoids (handout given)   SIGNATURES/CONFIDENTIALITY: You and/or your care partner have signed paperwork which will be entered into your electronic medical record.  These signatures attest to the fact that that the information above on your After Visit Summary has been reviewed and is understood.  Full responsibility of the confidentiality of this discharge information lies with you and/or your care-partner. 

## 2017-07-09 NOTE — Progress Notes (Signed)
To recovery, report to RN, VSS. 

## 2017-07-09 NOTE — Op Note (Signed)
[facility] Patient Name: Andrew Ayala Procedure Date: 07/09/2017 9:58 AM MRN: [id number] Endoscopist: Docia Chuck. Henrene Pastor , MD Age: 71 Referring MD:  Date of Birth: [birth year] Gender: Male Account #: [id number] Procedure:                Colonoscopy, with cold snare polypectomy x 2 Indications:              High risk colon cancer surveillance: Personal                            history of multiple (3 or more) adenomas. Previous                            examinations 2007 and 2013 Medicines:                Monitored Anesthesia Care Procedure:                Pre-Anesthesia Assessment:                           - Prior to the procedure, a History and Physical                            was performed, and patient medications and                            allergies were reviewed. The patient's tolerance of                            previous anesthesia was also reviewed. The risks                            and benefits of the procedure and the sedation                            options and risks were discussed with the patient.                            All questions were answered, and informed consent                            was obtained. Prior Anticoagulants: The patient has                            taken no previous anticoagulant or antiplatelet                            agents. ASA Grade Assessment: II - A patient with                            mild systemic disease. After reviewing the risks                            and benefits, the patient was deemed in  satisfactory condition to undergo the procedure.                           After obtaining informed consent, the colonoscope                            was passed under direct vision. Throughout the                            procedure, the patient's blood pressure, pulse, and                            oxygen saturations were monitored continuously. The   Colonoscope was introduced through the anus and                            advanced to the the cecum, identified by                            appendiceal orifice and ileocecal valve. The                            ileocecal valve, appendiceal orifice, and rectum                            were photographed. The quality of the bowel                            preparation was excellent. The colonoscopy was                            performed without difficulty. The patient tolerated                            the procedure well. The bowel preparation used was                            SUPREP. Scope In: 10:10:13 AM Scope Out: 10:32:57 AM Scope Withdrawal Time: 0 hours 16 minutes 4 seconds  Total Procedure Duration: 0 hours 22 minutes 44 seconds  Findings:                 Two polyps were found in the ascending colon and                            cecum. The polyps were 1 to 2 mm in size. These                            polyps were removed with a cold snare. Resection                            and retrieval were complete.                           Internal hemorrhoids were found during  retroflexion. The hemorrhoids were small.                           The exam was otherwise without abnormality on                            direct and retroflexion views. Complications:            No immediate complications. Estimated blood loss:                            None. Estimated Blood Loss:     Estimated blood loss: none. Impression:               - Two 1 to 2 mm polyps in the ascending colon and                            in the cecum, removed with a cold snare. Resected                            and retrieved.                           - Internal hemorrhoids.                           - The examination was otherwise normal on direct                            and retroflexion views. Recommendation:           - Repeat colonoscopy in 5 years for surveillance.                            - Patient has a contact number available for                            emergencies. The signs and symptoms of potential                            delayed complications were discussed with the                            patient. Return to normal activities tomorrow.                            Written discharge instructions were provided to the                            patient.                           - Resume previous diet.                           - Continue present medications.                           -  Await pathology results. Docia Chuck. Henrene Pastor, MD 07/09/2017 10:37:09 AM This report has been signed electronically.

## 2017-07-12 ENCOUNTER — Telehealth: Payer: Self-pay | Admitting: *Deleted

## 2017-07-12 NOTE — Telephone Encounter (Signed)
Message left

## 2017-07-13 ENCOUNTER — Encounter: Payer: Self-pay | Admitting: Internal Medicine

## 2017-08-25 ENCOUNTER — Telehealth: Payer: Self-pay | Admitting: Family Medicine

## 2017-08-25 MED ORDER — VARDENAFIL HCL 10 MG PO TABS: 10.0000 mg | ORAL_TABLET | Freq: Every day | ORAL | 5 refills | Status: DC | PRN

## 2017-08-25 NOTE — Telephone Encounter (Signed)
Pt wants refill Levitra 10 mg [facility] [address] said it works better than Sildenafil.  Pt know he has to pay out of pocket.

## 2017-10-30 DIAGNOSIS — J029 Acute pharyngitis, unspecified: Secondary | ICD-10-CM | POA: Diagnosis not present

## 2017-11-24 DIAGNOSIS — L821 Other seborrheic keratosis: Secondary | ICD-10-CM | POA: Diagnosis not present

## 2017-11-24 DIAGNOSIS — L570 Actinic keratosis: Secondary | ICD-10-CM | POA: Diagnosis not present

## 2017-11-24 DIAGNOSIS — D225 Melanocytic nevi of trunk: Secondary | ICD-10-CM | POA: Diagnosis not present

## 2017-11-24 DIAGNOSIS — D1801 Hemangioma of skin and subcutaneous tissue: Secondary | ICD-10-CM | POA: Diagnosis not present

## 2017-11-24 DIAGNOSIS — D485 Neoplasm of uncertain behavior of skin: Secondary | ICD-10-CM | POA: Diagnosis not present

## 2017-11-30 DIAGNOSIS — H5213 Myopia, bilateral: Secondary | ICD-10-CM | POA: Diagnosis not present

## 2017-11-30 DIAGNOSIS — H2513 Age-related nuclear cataract, bilateral: Secondary | ICD-10-CM | POA: Diagnosis not present

## 2017-12-16 ENCOUNTER — Ambulatory Visit (INDEPENDENT_AMBULATORY_CARE_PROVIDER_SITE_OTHER): Payer: Medicare Other | Admitting: Family Medicine

## 2017-12-16 VITALS — BP 100/70 | HR 60 | Ht 74.5 in | Wt 197.8 lb

## 2017-12-16 DIAGNOSIS — Z9889 Other specified postprocedural states: Secondary | ICD-10-CM

## 2017-12-16 DIAGNOSIS — N529 Male erectile dysfunction, unspecified: Secondary | ICD-10-CM

## 2017-12-16 DIAGNOSIS — Z136 Encounter for screening for cardiovascular disorders: Secondary | ICD-10-CM | POA: Diagnosis not present

## 2017-12-16 DIAGNOSIS — E785 Hyperlipidemia, unspecified: Secondary | ICD-10-CM

## 2017-12-16 DIAGNOSIS — Z86010 Personal history of colonic polyps: Secondary | ICD-10-CM

## 2017-12-16 DIAGNOSIS — J309 Allergic rhinitis, unspecified: Secondary | ICD-10-CM

## 2017-12-16 DIAGNOSIS — N401 Enlarged prostate with lower urinary tract symptoms: Secondary | ICD-10-CM

## 2017-12-16 DIAGNOSIS — Z8249 Family history of ischemic heart disease and other diseases of the circulatory system: Secondary | ICD-10-CM

## 2017-12-16 MED ORDER — TADALAFIL 5 MG PO TABS: 5.0000 mg | ORAL_TABLET | Freq: Every day | ORAL | 11 refills | Status: DC | PRN

## 2017-12-16 MED ORDER — ATORVASTATIN CALCIUM 10 MG PO TABS: 10.0000 mg | ORAL_TABLET | Freq: Every day | ORAL | 3 refills | Status: DC

## 2017-12-16 NOTE — Progress Notes (Signed)
Andrew Ayala is a 72 y.o. male who presents for annual wellness visit and follow-up on chronic medical conditions.  He has the following concerns: He does have a history of BPH and presently is on Flomax as well as finasteride.  He still sometimes gets up anywhere between 2-4 times per night.  He also has had difficulty with erectile dysfunction and has tried various medications for that.  Continues on atorvastatin and is having no difficulty with that.  His allergies seem to be under good control.  He is followed regularly by cardiology and has had a mitral repair.  He also has a history of colonic polyps.  He has not had screening for aneurysm.  Also has a family history of heart disease.   Immunizations and Health Maintenance Immunization History  Administered Date(s) Administered  . DT 01/15/1998, 05/27/2007  . Influenza Split 07/14/2001, 09/25/2003, 06/18/2012, 07/05/2013, 06/19/2014  . Influenza, High Dose Seasonal PF 06/12/2015  . Influenza-Unspecified 06/06/2016  . PPD Test 09/01/2001  . Pneumococcal Conjugate-13 08/25/2013  . Pneumococcal Polysaccharide-23 05/17/2006  . Tdap 05/27/2007  . Zoster 08/24/2008   Health Maintenance Due  Topic Date Due  . PNA vac Low Risk Adult (2 of 2 - PPSV23) 08/25/2014  . INFLUENZA VACCINE  04/21/2017  . TETANUS/TDAP  05/26/2017    Last colonoscopy: 2018 Last PSA: 2018 Dentist: every 6 months Ophtho: Dr. Valetta Close Exercise: 3x a week at the gyn  Other doctors caring for patient include: Dr. Henrene Pastor.- GI Dr. Johnsie Cancel- Cardiology [facility]  Advanced Directives:no info given    Depression screen:  See questionnaire below.     Depression screen [facility] 2/9 12/16/2017 11/20/2016 10/11/2015 07/10/2015 04/10/2015  Decreased Interest 0 0 0 0 0  Down, Depressed, Hopeless 0 0 0 0 0  PHQ - 2 Score 0 0 0 0 0    Fall Screen: See Questionaire below.   Fall Risk  12/16/2017 11/20/2016 10/11/2015 01/22/2015 08/25/2013  Falls in the past year? No No No No No     ADL screen:  See questionnaire below.  Functional Status Survey: Is the patient deaf or have difficulty hearing?: No Does the patient have difficulty seeing, even when wearing glasses/contacts?: No Does the patient have difficulty concentrating, remembering, or making decisions?: No Does the patient have difficulty walking or climbing stairs?: No Does the patient have difficulty dressing or bathing?: No Does the patient have difficulty doing errands alone such as visiting a doctor's office or shopping?: No   Review of Systems  Constitutional: -, -unexpected weight change, -anorexia, -fatigue Allergy: -sneezing, -itching, -congestion Dermatology: denies changing moles, rash, lumps ENT: -runny nose, -ear pain, -sore throat,  Cardiology:  -chest pain, -palpitations, -orthopnea, Respiratory: -cough, -shortness of breath, -dyspnea on exertion, -wheezing,  Gastroenterology: -abdominal pain, -nausea, -vomiting, -diarrhea, -constipation, -dysphagia Hematology: -bleeding or bruising problems Musculoskeletal: -arthralgias, -myalgias, -joint swelling, -back pain, - Ophthalmology: -vision changes,  Urology: -dysuria, -difficulty urinating,  -urinary frequency, -urgency, incontinence Neurology: -, -numbness, , -memory loss, -falls, -dizziness    PHYSICAL EXAM:  BP 100/70   Pulse 60   Ht 6' 2.5" (1.892 m)   Wt 197 lb 12.8 oz (89.7 kg)   BMI 25.06 kg/m   General Appearance: Alert, cooperative, no distress, appears stated age Head: Normocephalic, without obvious abnormality, atraumatic Eyes: PERRL, conjunctiva/corneas clear, EOM's intact, fundi benign Ears: Normal TM's and external ear canals Nose: Nares normal, mucosa normal, no drainage or sinus   tenderness Throat: Lips, mucosa, and tongue normal; teeth and gums normal  Neck: Supple, no lymphadenopathy, thyroid:no enlargement/tenderness/nodules; no carotid bruit or JVD Lungs: Clear to auscultation bilaterally without wheezes, rales  or ronchi; respirations unlabored Heart: Regular rate and rhythm, S1 and S2 normal, no murmur, rub or gallop Abdomen: Soft, non-tender, nondistended, normoactive bowel sounds, no masses, no hepatosplenomegaly Extremities: No clubbing, cyanosis or edema Pulses: 2+ and symmetric all extremities Skin: Skin color, texture, turgor normal, no rashes or lesions Lymph nodes: Cervical, supraclavicular, and axillary nodes normal Neurologic: CNII-XII intact, normal strength, sensation and gait; reflexes 2+ and symmetric throughout   Psych: Normal mood, affect, hygiene and grooming  ASSESSMENT/PLAN: Benign prostatic hyperplasia with lower urinary tract symptoms, symptom details unspecified - Plan: tadalafil (CIALIS) 5 MG tablet  Hyperlipidemia LDL goal <100 - Plan: Lipid panel, atorvastatin (LIPITOR) 10 MG tablet  Allergic rhinitis, mild  S/P minimally invasive mitral valve repair  Hx of colonic polyps  Erectile dysfunction, unspecified erectile dysfunction type - Plan: CBC with Differential/Platelet, Comprehensive metabolic panel, Lipid panel  Screening for AAA (abdominal aortic aneurysm) - Plan: US ABDOMINAL AORTA SCREENING AAA  Family history of heart disease - Plan: CBC with Differential/Platelet, Comprehensive metabolic panel, Lipid panel  Overall he is doing a good job of taking care of himself he will continue on his present medication regimen.  I will try to get him Cialis since he has BPH as well as ED.      Medicare Attestation I have personally reviewed: The patient's medical and social history Their use of alcohol, tobacco or illicit drugs Their current medications and supplements The patient's functional ability including ADLs,fall risks, home safety risks, cognitive, and hearing and visual impairment Diet and physical activities Evidence for depression or mood disorders  The patient's weight, height, and BMI have been recorded in the chart.  I have made referrals,  counseling, and provided education to the patient based on review of the above and I have provided the patient with a written personalized care plan for preventive services.     Jill Alexanders, MD   12/16/2017

## 2017-12-17 LAB — COMPREHENSIVE METABOLIC PANEL
A/G RATIO: 1.6 (ref 1.2–2.2)
ALBUMIN: 4.2 g/dL (ref 3.5–4.8)
ALK PHOS: 79 IU/L (ref 39–117)
ALT: 15 IU/L (ref 0–44)
AST: 19 IU/L (ref 0–40)
BILIRUBIN TOTAL: 0.9 mg/dL (ref 0.0–1.2)
BUN / CREAT RATIO: 11 (ref 10–24)
BUN: 13 mg/dL (ref 8–27)
CHLORIDE: 101 mmol/L (ref 96–106)
CO2: 23 mmol/L (ref 20–29)
Calcium: 9.1 mg/dL (ref 8.6–10.2)
Creatinine, Ser: 1.18 mg/dL (ref 0.76–1.27)
GFR calc Af Amer: 72 mL/min/{1.73_m2} (ref >59)
GFR calc non Af Amer: 62 mL/min/{1.73_m2} (ref >59)
GLOBULIN, TOTAL: 2.6 g/dL (ref 1.5–4.5)
Glucose: 86 mg/dL (ref 65–99)
POTASSIUM: 4.7 mmol/L (ref 3.5–5.2)
SODIUM: 137 mmol/L (ref 134–144)
TOTAL PROTEIN: 6.8 g/dL (ref 6.0–8.5)

## 2017-12-17 LAB — CBC WITH DIFFERENTIAL/PLATELET
BASOS: 1 %
Basophils Absolute: 0 10*3/uL (ref 0.0–0.2)
EOS (ABSOLUTE): 0.3 10*3/uL (ref 0.0–0.4)
Eos: 5 %
HEMOGLOBIN: 15.5 g/dL (ref 13.0–17.7)
Hematocrit: 46.5 % (ref 37.5–51.0)
IMMATURE GRANS (ABS): 0 10*3/uL (ref 0.0–0.1)
Immature Granulocytes: 0 %
LYMPHS ABS: 1.7 10*3/uL (ref 0.7–3.1)
Lymphs: 31 %
MCH: 33 pg (ref 26.6–33.0)
MCHC: 33.3 g/dL (ref 31.5–35.7)
MCV: 99 fL — AB (ref 79–97)
MONOCYTES: 13 %
Monocytes Absolute: 0.7 10*3/uL (ref 0.1–0.9)
NEUTROS ABS: 2.7 10*3/uL (ref 1.4–7.0)
Neutrophils: 50 %
Platelets: 175 10*3/uL (ref 150–379)
RBC: 4.7 x10E6/uL (ref 4.14–5.80)
RDW: 13.2 % (ref 12.3–15.4)
WBC: 5.4 10*3/uL (ref 3.4–10.8)

## 2017-12-17 LAB — LIPID PANEL
CHOLESTEROL TOTAL: 155 mg/dL (ref 100–199)
Chol/HDL Ratio: 3.8 ratio (ref 0.0–5.0)
HDL: 41 mg/dL (ref >39)
LDL Calculated: 93 mg/dL (ref 0–99)
Triglycerides: 104 mg/dL (ref 0–149)
VLDL CHOLESTEROL CAL: 21 mg/dL (ref 5–40)

## 2017-12-20 ENCOUNTER — Telehealth: Payer: Self-pay | Admitting: Family Medicine

## 2017-12-20 NOTE — Telephone Encounter (Signed)
Pt left message that Tadalafil requiring P.A., states he takes this for BPH P.A. TADALAFIL initiated

## 2017-12-22 ENCOUNTER — Other Ambulatory Visit: Payer: Self-pay | Admitting: Family Medicine

## 2017-12-22 ENCOUNTER — Ambulatory Visit (HOSPITAL_COMMUNITY)
Admission: RE | Admit: 2017-12-22 | Discharge: 2017-12-22 | Disposition: A | Discharge status: Home / Self Care | Payer: Medicare Other | Source: Ambulatory Visit | Attending: Family Medicine | Admitting: Family Medicine

## 2017-12-22 DIAGNOSIS — Z136 Encounter for screening for cardiovascular disorders: Secondary | ICD-10-CM | POA: Insufficient documentation

## 2017-12-22 DIAGNOSIS — Z87891 Personal history of nicotine dependence: Secondary | ICD-10-CM | POA: Insufficient documentation

## 2017-12-22 NOTE — Telephone Encounter (Signed)
Is this ok to refill?  

## 2017-12-23 ENCOUNTER — Telehealth: Payer: Self-pay | Admitting: Family Medicine

## 2017-12-23 ENCOUNTER — Other Ambulatory Visit: Payer: Self-pay | Admitting: Family Medicine

## 2017-12-23 NOTE — Telephone Encounter (Signed)
New Message  Pt verbalized inquiring about having his tdap.  Please f/u with pt

## 2017-12-23 NOTE — Telephone Encounter (Signed)
P.A. Approved, called pharmacy and even with approval the cost is still $225 for Tadalafil.  Called pt and he wants to go back to the Sildenafil because it is cheaper.  [facility].   Told him option of Medisuite #90 for $95 and he may want to go with that if [facility] is more expensive.   Is it ok to switch pt back to Sildenafil 20mg  #90?

## 2017-12-23 NOTE — Telephone Encounter (Signed)
Pt says that he has had the TD in 2018 not the T DAP and will have pharmacy send over for showing the info. Thanks [employer]

## 2017-12-23 NOTE — Telephone Encounter (Signed)
Let me know what he decides.  I have been denying getting a refill on his other ED meds until I know more

## 2017-12-23 NOTE — Telephone Encounter (Signed)
[facility] [address] [facility] Please advise . [place]

## 2017-12-29 MED ORDER — SILDENAFIL CITRATE 20 MG PO TABS: ORAL_TABLET | ORAL | 1 refills | Status: DC

## 2017-12-29 NOTE — Telephone Encounter (Signed)
Pt wants to get the Sildenafil #100 20mg  from [facility] [address] they have [employer] discount card and cash price will be $50.  [facility]

## 2017-12-29 NOTE — Addendum Note (Signed)
Addended by: Denita Lung on: 12/29/2017 03:55 PM   Modules accepted: Orders

## 2017-12-29 NOTE — Telephone Encounter (Signed)
Pt called back about Td and states [facility] and they can't print it out again, put we can check on the [facility] was given 12/02/16.  Called [facility] pharmcist and verified pt was given Tdap on 12/02/16.  A

## 2018-01-30 ENCOUNTER — Other Ambulatory Visit: Payer: Self-pay | Admitting: Family Medicine

## 2018-01-30 DIAGNOSIS — N401 Enlarged prostate with lower urinary tract symptoms: Secondary | ICD-10-CM

## 2018-02-23 ENCOUNTER — Other Ambulatory Visit: Payer: Self-pay | Admitting: Thoracic Surgery (Cardiothoracic Vascular Surgery)

## 2018-02-23 DIAGNOSIS — I38 Endocarditis, valve unspecified: Secondary | ICD-10-CM

## 2018-02-28 ENCOUNTER — Telehealth: Payer: Self-pay

## 2018-02-28 NOTE — Telephone Encounter (Signed)
Mr. Shelton contacted the office for a refill of his Amoxicillin for dental procedures.  He is s/p MVR 2016.  He has been released from our care.  I advised the patient to contact his Cardiologist or PCP regarding medication refills in the future.  He acknowledged receipt.

## 2018-03-29 ENCOUNTER — Other Ambulatory Visit: Payer: Self-pay | Admitting: Family Medicine

## 2018-05-27 DIAGNOSIS — Z23 Encounter for immunization: Secondary | ICD-10-CM | POA: Diagnosis not present

## 2018-06-01 ENCOUNTER — Ambulatory Visit (INDEPENDENT_AMBULATORY_CARE_PROVIDER_SITE_OTHER): Payer: Medicare Other | Admitting: Family Medicine

## 2018-06-01 ENCOUNTER — Encounter: Payer: Self-pay | Admitting: Family Medicine

## 2018-06-01 VITALS — BP 102/60 | HR 63 | Temp 97.6°F | Wt 197.4 lb

## 2018-06-01 DIAGNOSIS — J309 Allergic rhinitis, unspecified: Secondary | ICD-10-CM | POA: Diagnosis not present

## 2018-06-01 DIAGNOSIS — J029 Acute pharyngitis, unspecified: Secondary | ICD-10-CM | POA: Diagnosis not present

## 2018-06-01 LAB — POCT RAPID STREP A (OFFICE): RAPID STREP A SCREEN: NEGATIVE

## 2018-06-01 NOTE — Progress Notes (Signed)
   Subjective:    Patient ID: Andrew Ayala, male    DOB: [birth year], 72 y.o.   MRN: [id number]  HPI 5 days ago he was doing some housecleaning and did notice a lot of dust in the air.  Shortly after that he developed some sneezing, postnasal drainage, ear congestion.  He did use Sudafed as well as some irritant with some relief of the symptoms however this morning he noted a sore throat and some swollen lymph nodes in his neck and is concerned about strep.  He does have a previous history of allergies but rarely treats it.   Review of Systems     Objective:   Physical Exam Alert and in no distress. Tympanic membranes and canals are normal. Pharyngeal area is normal. Neck is supple with shotty adenopathy no thyromegaly. Cardiac exam shows a regular sinus rhythm without murmurs or gallops. Lungs are clear to auscultation. Strep screen is negative       Assessment & Plan:  Sore throat - Plan: Rapid Strep A  Allergic rhinitis, unspecified seasonality, unspecified trigger Recommend continue on allergy medications and symptomatic care for the sore throat.

## 2018-06-30 DIAGNOSIS — L814 Other melanin hyperpigmentation: Secondary | ICD-10-CM | POA: Diagnosis not present

## 2018-06-30 DIAGNOSIS — L658 Other specified nonscarring hair loss: Secondary | ICD-10-CM | POA: Diagnosis not present

## 2018-06-30 DIAGNOSIS — D225 Melanocytic nevi of trunk: Secondary | ICD-10-CM | POA: Diagnosis not present

## 2018-07-06 DIAGNOSIS — L659 Nonscarring hair loss, unspecified: Secondary | ICD-10-CM | POA: Diagnosis not present

## 2018-07-15 ENCOUNTER — Encounter: Payer: Self-pay | Admitting: Family Medicine

## 2018-07-15 ENCOUNTER — Ambulatory Visit (INDEPENDENT_AMBULATORY_CARE_PROVIDER_SITE_OTHER): Payer: Medicare Other | Admitting: Family Medicine

## 2018-07-15 VITALS — BP 110/68 | HR 64 | Temp 97.7°F | Resp 16 | Wt 198.2 lb

## 2018-07-15 DIAGNOSIS — H8110 Benign paroxysmal vertigo, unspecified ear: Secondary | ICD-10-CM

## 2018-07-15 DIAGNOSIS — H9311 Tinnitus, right ear: Secondary | ICD-10-CM | POA: Diagnosis not present

## 2018-07-15 DIAGNOSIS — R42 Dizziness and giddiness: Secondary | ICD-10-CM | POA: Diagnosis not present

## 2018-07-15 DIAGNOSIS — R2681 Unsteadiness on feet: Secondary | ICD-10-CM | POA: Diagnosis not present

## 2018-07-15 MED ORDER — MECLIZINE HCL 25 MG PO TABS: 25.0000 mg | ORAL_TABLET | Freq: Two times a day (BID) | ORAL | 0 refills | Status: DC | PRN

## 2018-07-15 NOTE — Patient Instructions (Signed)

## 2018-07-15 NOTE — Progress Notes (Signed)
Subjective:  Andrew Ayala is a 72 y.o. male who presents for a 5 day history of tinnitus on the right. He also reports having vertigo type dizziness when he layed down last night. He reports listing to the right and having nausea and vomiting. Reports dizziness has improved but still has brief episodes with certain movements lasting a few seconds. No longer having N/V.  No weakness, numbness or tingling.   History of tinnitus and vertigo. Triggers are usually related to allergies. States he was working outside last week in hay, weeds and painting his house. He has also noticed nasal congestion and his right ear feeling clogged. He used peroxide and OTC ear flush which helped some.   Staying well hydrated. Normal urination yesterday.   Denies fever, chills, headache, vision changes, chest pain, palpitations, shortness of breath, abdominal pain.   Treatment to date: expired Dramamine that helped.  No other aggravating or relieving factors.  No other c/o.  ROS as in subjective.   Objective: Vitals:   07/15/18 0925  BP: 110/68  Pulse: 64  Resp: 16  Temp: 97.7 F (36.5 C)  SpO2: 98%    General appearance: Alert, WD/WN, no distress, well appearing                             Skin: warm, no rash or pallor                           Head: no sinus tenderness                            Eyes: conjunctiva normal, corneas clear, PERRLA, EOMs intact                            Ears: pearly TMs, external ear canals normal, decreased gross hearing on right                           Nose: septum deviated, turbinates swollen, without erythema or discharge             Mouth/throat: MMM, tongue normal, mild pharyngeal erythema, uvula rises midline                            Neck: supple, no adenopathy, no thyromegaly, nontender, normal ROM                          Heart: RRR, normal S1, S2, no murmurs                         Lungs: CTA bilaterally, no wheezes, rales, or rhonchi   Extremities:  without edema, pulses intact.    Neuro: normal peripheral vision testing, no facial asymmetry, no pronator drift, normal finger to nose and heel to shin.    Negative Romberg. DTRs normal and symmetric. CNs intact.       Assessment: Benign paroxysmal positional vertigo, unspecified laterality - Plan: meclizine (ANTIVERT) 25 MG tablet  Tinnitus of right ear    Plan: No acute distress. No sign of an acute neurological event with a normal neuro exam. Able to reproduce dizziness with laying down and sitting up but resolved within seconds.  Will send him to vestibular rehab now at [facility] to see if this helps. Meclizine ordered and advised this may cause sedation.  Stay well hydrated and change positions slowly.  If tinnitus persists or if dizziness returns or worsens and not resolving with medication or PT he will follow up. Call 911 or go to the ED if any stroke like symptoms.

## 2018-07-27 ENCOUNTER — Telehealth: Payer: Self-pay | Admitting: Family Medicine

## 2018-07-27 NOTE — Telephone Encounter (Signed)
   Please call re ringing in ear  He took meclizine for 1 week, seemed to get better. Stopped meds, ringing came back. He started back taking meclizine but has not cleared it up this time. He has also tried sudafed. Ringing not quite as loud but still there. Said you mentioned ENT referral  His wife recommended Dr. Ernesto Rutherford, if that was ok with you  Please call

## 2018-07-27 NOTE — Telephone Encounter (Signed)
Ok to refer him to ENT. He could come in to see Dr. Redmond School if he would like.

## 2018-07-28 NOTE — Telephone Encounter (Signed)
Pt was notified and I will send referral over to Dr. Ernesto Rutherford

## 2018-08-02 DIAGNOSIS — J301 Allergic rhinitis due to pollen: Secondary | ICD-10-CM | POA: Diagnosis not present

## 2018-08-02 DIAGNOSIS — H90A22 Sensorineural hearing loss, unilateral, left ear, with restricted hearing on the contralateral side: Secondary | ICD-10-CM | POA: Diagnosis not present

## 2018-08-02 DIAGNOSIS — J040 Acute laryngitis: Secondary | ICD-10-CM | POA: Diagnosis not present

## 2018-08-02 DIAGNOSIS — H6521 Chronic serous otitis media, right ear: Secondary | ICD-10-CM | POA: Diagnosis not present

## 2018-08-02 DIAGNOSIS — H903 Sensorineural hearing loss, bilateral: Secondary | ICD-10-CM | POA: Diagnosis not present

## 2018-08-02 DIAGNOSIS — H8101 Meniere's disease, right ear: Secondary | ICD-10-CM | POA: Diagnosis not present

## 2018-08-02 DIAGNOSIS — J322 Chronic ethmoidal sinusitis: Secondary | ICD-10-CM | POA: Diagnosis not present

## 2018-08-02 DIAGNOSIS — J3089 Other allergic rhinitis: Secondary | ICD-10-CM | POA: Diagnosis not present

## 2018-08-02 DIAGNOSIS — H90A31 Mixed conductive and sensorineural hearing loss, unilateral, right ear with restricted hearing on the contralateral side: Secondary | ICD-10-CM | POA: Diagnosis not present

## 2018-08-02 DIAGNOSIS — J320 Chronic maxillary sinusitis: Secondary | ICD-10-CM | POA: Diagnosis not present

## 2018-08-05 NOTE — Progress Notes (Signed)
Patient ID: Andrew Ayala, male   DOB: 11/10/1945, 72 y.o.   MRN: [id number]    Cardiology Office Note   Date:  08/11/2018   ID:  Andrew Ayala, DOB Sep 19, 1946, MRN [id number]  PCP:  Denita Lung, MD  Cardiologist:   Jenkins Rouge, MD   No chief complaint on file.     History of Present Illness: Andrew Ayala is a 72 y.o. male who presents for f/u MV repair 2013 transferred care from [facility]  TTE showed bi-leaflet Prolapse with severe MR. Cath 02/08/15 normal cors and right heart pressures severe MR  02/27/15 Surgical repair Roxy Manns:  Minimally-Invasive Mitral Valve Repair Complex valvuloplasty including triangular resection of posterior leaflet Artificial Gore-tex neochord placement x8 Sorin Memo 3D Ring Annuloplasty (size 66mm, catalog # [id number], serial # [id number])  03/27/15  :  Post op echo reviewed  EF 50-55% no residual MR  Study Conclusions  - Left ventricle: Distal septal and apical hypokinesis. The cavity size was normal. Systolic function was normal. The estimated ejection fraction was in the range of 50% to 55%. Wall motion was normal; there were no regional wall motion abnormalities. - Mitral valve: S/P complex mitral valve repair Normal diastolic gradients and no significant residual MR. - Atrial septum: No defect or patent foramen ovale was identified.  Son is a Teacher, music in [place] and his male partner are thinking of adopting  Having issues with vertigo recently Seeing Dr Pixie Casino taking Meclizine   Past Medical History:  Diagnosis Date  . Acne rosacea   . Allergic rhinitis   . Allergy   . BPH (benign prostatic hyperplasia)   . GERD (gastroesophageal reflux disease)    no meds/some reflux/takes tums  . Heart murmur   . Hyperlipidemia   . MVP (mitral valve prolapse)    repaired  . S/P minimally invasive mitral valve repair 02/27/2015   Complex valvuloplasty including triangular resection of flail segment of posterior  leaflet, artificial Gore-tex neochord placement x8 and 34 mm Sorin Memo 3D Rechord ring annuloplasty  . Sensory - neural hearing loss   . Severe mitral regurgitation 10/05/2014  . Vasomotor rhinitis     Past Surgical History:  Procedure Laterality Date  . CARDIAC CATHETERIZATION N/A 02/08/2015   Procedure: Left Heart Cath And Coronary Angiography;  Surgeon: Josue Hector, MD;  Location: [place] [facility];  Service: Cardiovascular;  Laterality: N/A;  . COLONOSCOPY  2007   Dr. Henrene Pastor  . HAND SURGERY  2001   right  . MITRAL VALVE REPAIR Right 02/27/2015   Procedure: MINIMALLY INVASIVE MITRAL VALVE REPAIR (MVR);  Surgeon: Rexene Alberts, MD;  Location: [place];  Service: Open Heart Surgery;  Laterality: Right;  . POLYPECTOMY    . SEPTOPLASTY  1998  . TEE WITHOUT CARDIOVERSION N/A 10/03/2014   Procedure: TRANSESOPHAGEAL ECHOCARDIOGRAM (TEE);  Surgeon: Josue Hector, MD;  Location: [place];  Service: Cardiovascular;  Laterality: N/A;  . TEE WITHOUT CARDIOVERSION N/A 02/27/2015   Procedure: TRANSESOPHAGEAL ECHOCARDIOGRAM (TEE);  Surgeon: Rexene Alberts, MD;  Location: [place];  Service: Open Heart Surgery;  Laterality: N/A;  . undescended testicle  1951  . wart removal  1997   cryo surgery (penis)  . WISDOM TOOTH EXTRACTION  1994     Current Outpatient Medications  Medication Sig Dispense Refill  . aspirin EC 81 MG tablet Take 81 mg by mouth daily before supper.    Marland Kitchen atorvastatin (LIPITOR) 10 MG tablet Take 1 tablet (10 mg total) by mouth daily.  90 tablet 3  . cefUROXime (CEFTIN) 250 MG tablet Take 250 mg by mouth 2 (two) times daily with a meal.    . chlorthalidone (HYGROTON) 25 MG tablet Take 25 mg by mouth daily.    . Eyelid Cleansers (OCUSOFT LID SCRUB EX) Apply topically 2 (two) times daily.     . fexofenadine (ALLEGRA) 180 MG tablet Take 180 mg by mouth daily.    . finasteride (PROSCAR) 5 MG tablet TAKE 1 TABLET (5 MG TOTAL) BY MOUTH DAILY. 90 tablet 2  . GLUCOSAMINE-CHONDROITIN PO  Take 1 tablet by mouth daily. Glucosamine 1500 mg/ chrondroiton 1200 mg    . lactobacillus acidophilus & bulgar (LACTINEX) chewable tablet Chew 1 tablet by mouth 3 (three) times daily with meals.    . meclizine (ANTIVERT) 25 MG tablet Take 1 tablet (25 mg total) by mouth 2 (two) times daily as needed for dizziness. 30 tablet 0  . Multiple Vitamins-Minerals (MULTIVITAMIN WITH MINERALS) tablet Take 1 tablet by mouth daily.    . Omega-3 Fatty Acids (FISH OIL) 1200 MG CAPS Take 1 capsule by mouth at bedtime.    . potassium chloride (K-DUR) 10 MEQ tablet Take 10 mEq by mouth every other day.    . sildenafil (REVATIO) 20 MG tablet Take 1 to 5 pills as needed for erection. 100 tablet 1  . tamsulosin (FLOMAX) 0.4 MG CAPS capsule TAKE 1 CAPSULE BY MOUTH EVERY DAY 90 capsule 3   No current facility-administered medications for this visit.     Allergies:   Patient has no known allergies.    Social History:  The patient  reports that he has quit smoking. He has never used smokeless tobacco. He reports that he drinks alcohol. He reports that he does not use drugs.   Family History:  The patient's family history includes Colon cancer (age of onset: 28) in his paternal uncle; Colon cancer (age of onset: 30) in his paternal uncle; Heart attack in his father; Heart failure in his mother; Pancreatic cancer in his paternal grandfather and paternal grandmother; Stroke in his sister.    ROS:  Please see the history of present illness.   Otherwise, review of systems are positive for none.   All other systems are reviewed and negative.    PHYSICAL EXAM: VS:  BP 90/60   Pulse 68   Ht 6' 2.5" (1.892 m)   Wt 196 lb 8 oz (89.1 kg)   SpO2 98%   BMI 24.89 kg/m  , BMI Body mass index is 24.89 kg/m. Affect appropriate Healthy:  appears stated age 72: normal Neck supple with no adenopathy JVP normal no bruits no thyromegaly Lungs clear with no wheezing and good diaphragmatic motion Heart:  S1/S2 no murmur,  no rub, gallop or click post mini right thoracotomy PMI normal Abdomen: benighn, BS positve, no tenderness, no AAA no bruit.  No HSM or HJR Distal pulses intact with no bruits No edema Neuro non-focal Skin warm and dry No muscular weakness   EKG:   10/05/14  EKG  SR rate 56 normal  06/18/16 SR rate 62 normal  07/06/17 SR PVC rate 62 08/11/18  SR rate 62  Normal    Recent Labs: 12/16/2017: ALT 15; BUN 13; Creatinine, Ser 1.18; Hemoglobin 15.5; Platelets 175; Potassium 4.7; Sodium 137    Lipid Panel    Component Value Date/Time   CHOL 155 12/16/2017 1146   TRIG 104 12/16/2017 1146   HDL 41 12/16/2017 1146   CHOLHDL 3.8 12/16/2017 1146  CHOLHDL 4.1 11/20/2016 1048   VLDL 27 11/20/2016 1048   LDLCALC 93 12/16/2017 1146      Wt Readings from Last 3 Encounters:  08/11/18 196 lb 8 oz (89.1 kg)  07/15/18 198 lb 3.2 oz (89.9 kg)  06/01/18 197 lb 6.4 oz (89.5 kg)      Other studies Reviewed: Additional studies/ records that were reviewed today include: TEE/TTE., Cath Intraop TEE and OR report    Plan:  MV repair:   No MR on exam has been 4.5 years since TTE will update SBE prophylaxis   Chol:  Cholesterol is at goal.  Continue current dose of statin and diet Rx.  No myalgias or side effects.  F/U  LFT's in 6 months. Lab Results  Component Value Date   LDLCALC 93 12/16/2017          Meniere's  :  F/u [place] Seems to be improving Discussed benefit of vestibular PT. Also  Discussed need for MRI and carotid duplex if symptoms worsen    F/U in a year  Signed, Jenkins Rouge, MD  08/11/2018 10:08 AM    [facility] [address], [place], [place]  [address] Phone: [phone]; Fax: [phone]

## 2018-08-09 DIAGNOSIS — H8113 Benign paroxysmal vertigo, bilateral: Secondary | ICD-10-CM | POA: Diagnosis not present

## 2018-08-09 DIAGNOSIS — H9311 Tinnitus, right ear: Secondary | ICD-10-CM | POA: Diagnosis not present

## 2018-08-09 DIAGNOSIS — H903 Sensorineural hearing loss, bilateral: Secondary | ICD-10-CM | POA: Diagnosis not present

## 2018-08-11 ENCOUNTER — Ambulatory Visit (INDEPENDENT_AMBULATORY_CARE_PROVIDER_SITE_OTHER): Payer: Medicare Other | Admitting: Cardiovascular Disease

## 2018-08-11 ENCOUNTER — Encounter: Payer: Self-pay | Admitting: Cardiovascular Disease

## 2018-08-11 VITALS — BP 90/60 | HR 68 | Ht 74.5 in | Wt 196.5 lb

## 2018-08-11 DIAGNOSIS — E785 Hyperlipidemia, unspecified: Secondary | ICD-10-CM

## 2018-08-11 DIAGNOSIS — Z9889 Other specified postprocedural states: Secondary | ICD-10-CM | POA: Diagnosis not present

## 2018-08-11 DIAGNOSIS — I059 Rheumatic mitral valve disease, unspecified: Secondary | ICD-10-CM | POA: Diagnosis not present

## 2018-08-11 NOTE — Patient Instructions (Addendum)
Medication Instructions:   If you need a refill on your cardiac medications before your next appointment, please call your pharmacy.   Lab work:  If you have labs (blood work) drawn today and your tests are completely normal, you will receive your results only by: . MyChart Message (if you have MyChart) OR . A paper copy in the mail If you have any lab test that is abnormal or we need to change your treatment, we will call you to review the results.  Testing/Procedures: Your physician has requested that you have an echocardiogram. Echocardiography is a painless test that uses sound waves to create images of your heart. It provides your doctor with information about the size and shape of your heart and how well your heart's chambers and valves are working. This procedure takes approximately one hour. There are no restrictions for this procedure.  Follow-Up: At [facility], you and your health needs are our priority.  As part of our continuing mission to provide you with exceptional heart care, we have created designated Provider Care Teams.  These Care Teams include your primary Cardiologist (physician) and Advanced Practice Providers (APPs -  Physician Assistants and Nurse Practitioners) who all work together to provide you with the care you need, when you need it. You will need a follow up appointment in 1 years.  Please call our office 2 months in advance to schedule this appointment.  You may see Dr. Nishan or one of the following Advanced Practice Providers on your designated Care Team:   Lori Gerhardt, NP Laura Ingold, NP . Jill McDaniel, NP    

## 2018-08-30 DIAGNOSIS — H9313 Tinnitus, bilateral: Secondary | ICD-10-CM | POA: Diagnosis not present

## 2018-08-30 DIAGNOSIS — H902 Conductive hearing loss, unspecified: Secondary | ICD-10-CM | POA: Diagnosis not present

## 2018-08-30 DIAGNOSIS — H8113 Benign paroxysmal vertigo, bilateral: Secondary | ICD-10-CM | POA: Diagnosis not present

## 2018-08-30 DIAGNOSIS — H903 Sensorineural hearing loss, bilateral: Secondary | ICD-10-CM | POA: Diagnosis not present

## 2018-09-01 ENCOUNTER — Other Ambulatory Visit: Payer: Self-pay

## 2018-09-01 ENCOUNTER — Ambulatory Visit (HOSPITAL_COMMUNITY): Payer: Medicare Other | Attending: Cardiovascular Disease

## 2018-09-01 DIAGNOSIS — Z9889 Other specified postprocedural states: Secondary | ICD-10-CM | POA: Insufficient documentation

## 2018-09-01 DIAGNOSIS — I059 Rheumatic mitral valve disease, unspecified: Secondary | ICD-10-CM | POA: Diagnosis not present

## 2018-10-27 DIAGNOSIS — J301 Allergic rhinitis due to pollen: Secondary | ICD-10-CM | POA: Diagnosis not present

## 2018-10-27 DIAGNOSIS — J322 Chronic ethmoidal sinusitis: Secondary | ICD-10-CM | POA: Diagnosis not present

## 2018-10-27 DIAGNOSIS — J320 Chronic maxillary sinusitis: Secondary | ICD-10-CM | POA: Diagnosis not present

## 2018-10-27 DIAGNOSIS — H8103 Meniere's disease, bilateral: Secondary | ICD-10-CM | POA: Diagnosis not present

## 2018-10-28 ENCOUNTER — Other Ambulatory Visit: Payer: Self-pay | Admitting: Family Medicine

## 2018-10-28 DIAGNOSIS — N401 Enlarged prostate with lower urinary tract symptoms: Secondary | ICD-10-CM

## 2018-10-31 DIAGNOSIS — D225 Melanocytic nevi of trunk: Secondary | ICD-10-CM | POA: Diagnosis not present

## 2018-10-31 DIAGNOSIS — L814 Other melanin hyperpigmentation: Secondary | ICD-10-CM | POA: Diagnosis not present

## 2018-10-31 DIAGNOSIS — L570 Actinic keratosis: Secondary | ICD-10-CM | POA: Diagnosis not present

## 2018-10-31 DIAGNOSIS — L821 Other seborrheic keratosis: Secondary | ICD-10-CM | POA: Diagnosis not present

## 2018-10-31 DIAGNOSIS — L648 Other androgenic alopecia: Secondary | ICD-10-CM | POA: Diagnosis not present

## 2018-10-31 DIAGNOSIS — D1801 Hemangioma of skin and subcutaneous tissue: Secondary | ICD-10-CM | POA: Diagnosis not present

## 2018-11-10 DIAGNOSIS — H9311 Tinnitus, right ear: Secondary | ICD-10-CM | POA: Diagnosis not present

## 2018-11-10 DIAGNOSIS — J301 Allergic rhinitis due to pollen: Secondary | ICD-10-CM | POA: Diagnosis not present

## 2018-11-10 DIAGNOSIS — H8111 Benign paroxysmal vertigo, right ear: Secondary | ICD-10-CM | POA: Diagnosis not present

## 2018-11-10 DIAGNOSIS — H903 Sensorineural hearing loss, bilateral: Secondary | ICD-10-CM | POA: Diagnosis not present

## 2018-12-15 ENCOUNTER — Telehealth: Payer: Self-pay

## 2018-12-15 NOTE — Telephone Encounter (Signed)
Called pt about Tuesday appt to see if we could do a webex visit by downloading

## 2018-12-20 ENCOUNTER — Encounter: Payer: Self-pay | Admitting: Family Medicine

## 2018-12-20 ENCOUNTER — Ambulatory Visit (INDEPENDENT_AMBULATORY_CARE_PROVIDER_SITE_OTHER): Payer: Medicare Other | Admitting: Family Medicine

## 2018-12-20 ENCOUNTER — Other Ambulatory Visit: Payer: Self-pay

## 2018-12-20 DIAGNOSIS — Z0000 Encounter for general adult medical examination without abnormal findings: Secondary | ICD-10-CM

## 2018-12-20 DIAGNOSIS — N401 Enlarged prostate with lower urinary tract symptoms: Secondary | ICD-10-CM

## 2018-12-20 MED ORDER — TAMSULOSIN HCL 0.4 MG PO CAPS: 0.8000 mg | ORAL_CAPSULE | Freq: Every day | ORAL | 3 refills | Status: DC

## 2018-12-20 NOTE — Progress Notes (Addendum)
Andrew Ayala is a 73 y.o. male who presents for annual wellness visit. Documentation for virtual telephone encounter.     We continued and completed visit with audio only.  The patient was located at home. The provider was located in the office. The patient did consent to this visit and is aware of possible charges through their insurance for this visit.  The other persons participating in this telemedicine service were none. This virtual service is not related to other E/M service within previous 7 days.   Immunizations and Health Maintenance Immunization History  Administered Date(s) Administered  . DT 01/15/1998, 05/27/2007  . Influenza Split 07/14/2001, 09/25/2003, 06/18/2012, 07/05/2013, 06/19/2014  . Influenza, High Dose Seasonal PF 06/12/2015, 05/12/2017  . Influenza-Unspecified 06/06/2016  . PPD Test 09/01/2001  . Pneumococcal Conjugate-13 08/25/2013  . Pneumococcal Polysaccharide-23 05/17/2006  . Tdap 05/27/2007, 12/02/2016  . Zoster 08/24/2008  . Zoster Recombinat (Shingrix) 06/25/2017, 10/25/2017   Health Maintenance Due  Topic Date Due  . INFLUENZA VACCINE  04/21/2018    Last colonoscopy:07-09-2017 Last PSA:11-20-2016 Dentist:Dec 2019 Ophtho: two years ago Exercise: gym , walking and staying active in home  Other doctors caring for patient include: Dr. Johnsie Cancel Cardio, Dr. Henrene Pastor GI, Dr. Ernesto Rutherford ENT  Advanced Directives: Does Patient Have a Medical Advance Directive?: Yes Type of Advance Directive: [place] will Does patient want to make changes to medical advance directive?: No - Patient declined  Depression screen:  See questionnaire below.     Depression screen [facility] 2/9 12/20/2018 12/16/2017 11/20/2016 10/11/2015 07/10/2015  Decreased Interest 0 0 0 0 0  Down, Depressed, Hopeless 0 0 0 0 0  PHQ - 2 Score 0 0 0 0 0    Fall Screen: See Questionaire below.   Fall Risk  12/20/2018 12/16/2017 11/20/2016 10/11/2015 01/22/2015  Falls in  the past year? 0 No No No No    ADL screen:  See questionnaire below.  Functional Status Survey: Is the patient deaf or have difficulty hearing?: No Does the patient have difficulty seeing, even when wearing glasses/contacts?: No Does the patient have difficulty concentrating, remembering, or making decisions?: No Does the patient have difficulty walking or climbing stairs?: No Does the patient have difficulty dressing or bathing?: No Does the patient have difficulty doing errands alone such as visiting a doctor's office or shopping?: No   Review of Systems N/A   PHYSICAL EXAM:  There were no vitals taken for this visit. Also discussed the use of finasteride as well as Flomax.  He is still getting up in the middle night quite frequently to urinate.  Recommend that he double the dose of the Flomax.  Also discussed continuing on sildenafil to help with his ED which he plans to do.  Recommend he follow-up in several months after the pandemic has quieted down.  He was comfortable with that.   healthy diet and alcohol recommendations (less than or equal to 2 drinks/day) reviewed; regular seatbelt use; changing batteries in smoke detectors. Immunization recommendations discussed.  Colonoscopy recommendations reviewed.   Medicare Attestation I have personally reviewed: The patient's medical and social history Their use of alcohol, tobacco or illicit drugs Their current medications and supplements The patient's functional ability including ADLs,fall risks, home safety risks, cognitive, and hearing and visual impairment Diet and physical activities Evidence for depression or mood disorders    I have made referrals, counseling, and provided education to the patient based on review of the above and I have provided the patient  with a written personalized care plan for preventive services.     Jill Alexanders, MD   12/20/2018

## 2018-12-21 NOTE — Patient Instructions (Signed)
  Andrew Ayala , Thank you for taking time to come for your Medicare Wellness Visit. I appreciate your ongoing commitment to your health goals. Please review the following plan we discussed and let me know if I can assist you in the future.   These are the goals we discussed: Goals   None     This is a list of the screening recommended for you and due dates:  Health Maintenance  Topic Date Due  . Flu Shot  04/22/2019  . Colon Cancer Screening  07/09/2022  . Tetanus Vaccine  12/03/2026  .  Hepatitis C: One time screening is recommended by Center for Disease Control  (CDC) for  adults born from [birth year] through 1964.   Completed  . Pneumonia vaccines  Discontinued

## 2019-01-02 ENCOUNTER — Telehealth: Payer: Self-pay | Admitting: Family Medicine

## 2019-01-02 MED ORDER — POTASSIUM CHLORIDE ER 10 MEQ PO TBCR: 10.0000 meq | EXTENDED_RELEASE_TABLET | ORAL | 3 refills | Status: DC

## 2019-01-02 NOTE — Telephone Encounter (Signed)
Pt called and is requesting a refill on his Klor-con m 10 (potassium Chloride) he has been getting it from Dr Ernesto Rutherford ENT, which has retired and his practiced closed the end of march , pt has been diagnosed with Mineres which he states fluids builds up behind his ears and makes his ears ring pt states he called there office and they informed him to call his PCP.pt uses [id number] - [address], [place]

## 2019-01-02 NOTE — Addendum Note (Signed)
Addended by: Denita Lung on: 01/02/2019 02:02 PM   Modules accepted: Orders

## 2019-01-09 DIAGNOSIS — H00014 Hordeolum externum left upper eyelid: Secondary | ICD-10-CM | POA: Diagnosis not present

## 2019-01-23 DIAGNOSIS — H8103 Meniere's disease, bilateral: Secondary | ICD-10-CM | POA: Diagnosis not present

## 2019-01-31 ENCOUNTER — Other Ambulatory Visit: Payer: Self-pay | Admitting: Family Medicine

## 2019-01-31 DIAGNOSIS — E785 Hyperlipidemia, unspecified: Secondary | ICD-10-CM

## 2019-06-20 DIAGNOSIS — Z23 Encounter for immunization: Secondary | ICD-10-CM | POA: Diagnosis not present

## 2019-07-18 ENCOUNTER — Encounter: Payer: Self-pay | Admitting: Cardiovascular Disease

## 2019-07-31 ENCOUNTER — Other Ambulatory Visit: Payer: Self-pay | Admitting: Family Medicine

## 2019-07-31 DIAGNOSIS — N401 Enlarged prostate with lower urinary tract symptoms: Secondary | ICD-10-CM

## 2019-08-10 ENCOUNTER — Telehealth: Payer: Self-pay | Admitting: Family Medicine

## 2019-08-10 NOTE — Telephone Encounter (Signed)
Pt was advised [facility] 

## 2019-08-10 NOTE — Telephone Encounter (Signed)
Pt called and is requesting some advice pt is having guest come over for thanksgiving and including him and his wife a total of 8 his son Is coming from [place] and he tested negative, and he wants to know if he should get tested before having guest over, pt can be reached at [phone]

## 2019-08-10 NOTE — Telephone Encounter (Signed)
Let him know that the recommendation is to not  have people over for Thanksgiving.  Testing is helpful but it only speaks to that one particular time and does not really clear the guest completely as he can be negative one day and positive the next.

## 2019-08-23 ENCOUNTER — Ambulatory Visit: Payer: Medicare Other | Admitting: Cardiovascular Disease

## 2019-08-23 NOTE — Progress Notes (Signed)
Patient ID: Andrew Ayala, male   DOB: 1946-04-16, 73 y.o.   MRN: [id number]    Cardiology Office Note   Date:  09/01/2019   ID:  Andrew Ayala, DOB 01/19/1946, MRN [id number]  PCP:  Denita Lung, MD  Cardiologist:   Jenkins Rouge, MD   No chief complaint on file.     History of Present Illness: Andrew Ayala is a 73 y.o. male who presents for f/u MV repair 2013 transferred care from [facility]  TTE showed bi-leaflet Prolapse with severe MR. Cath 02/08/15 normal cors and right heart pressures severe MR  02/27/15 Surgical repair Roxy Manns:  Minimally-Invasive Mitral Valve Repair Complex valvuloplasty including triangular resection of posterior leaflet Artificial Gore-tex neochord placement x8 Sorin Memo 3D Ring Annuloplasty (size 84mm, catalog # [id number], serial # [id number])  Most recent echo 09/01/18 with no residual MR normal EF 60-65% no LAE and peak diastolic gradient Through valve only 3 mmHg   Son is a psychiatrist in [place] and his male partner are thinking of adopting  Having issues with vertigo recently Seeing Dr Pixie Casino taking Meclizine and diuretic Frequent nightly urination and Flomax increased by Dr Redmond School in March 2020  No cardiac complaints  Wife active does yoga and swims at [facility]  Past Medical History:  Diagnosis Date  . Acne rosacea   . Allergic rhinitis   . Allergy   . BPH (benign prostatic hyperplasia)   . GERD (gastroesophageal reflux disease)    no meds/some reflux/takes tums  . Heart murmur   . Hyperlipidemia   . MVP (mitral valve prolapse)    repaired  . S/P minimally invasive mitral valve repair 02/27/2015   Complex valvuloplasty including triangular resection of flail segment of posterior leaflet, artificial Gore-tex neochord placement x8 and 34 mm Sorin Memo 3D Rechord ring annuloplasty  . Sensory - neural hearing loss   . Severe mitral regurgitation 10/05/2014  . Vasomotor rhinitis     Past Surgical History:   Procedure Laterality Date  . CARDIAC CATHETERIZATION N/A 02/08/2015   Procedure: Left Heart Cath And Coronary Angiography;  Surgeon: Josue Hector, MD;  Location: [place] [facility];  Service: Cardiovascular;  Laterality: N/A;  . COLONOSCOPY  2007   Dr. Henrene Pastor  . HAND SURGERY  2001   right  . MITRAL VALVE REPAIR Right 02/27/2015   Procedure: MINIMALLY INVASIVE MITRAL VALVE REPAIR (MVR);  Surgeon: Rexene Alberts, MD;  Location: [place] [facility];  Service: Open Heart Surgery;  Laterality: Right;  . POLYPECTOMY    . SEPTOPLASTY  1998  . TEE WITHOUT CARDIOVERSION N/A 10/03/2014   Procedure: TRANSESOPHAGEAL ECHOCARDIOGRAM (TEE);  Surgeon: Josue Hector, MD;  Location: [place];  Service: Cardiovascular;  Laterality: N/A;  . TEE WITHOUT CARDIOVERSION N/A 02/27/2015   Procedure: TRANSESOPHAGEAL ECHOCARDIOGRAM (TEE);  Surgeon: Rexene Alberts, MD;  Location: [facility];  Service: Open Heart Surgery;  Laterality: N/A;  . undescended testicle  1951  . wart removal  1997   cryo surgery (penis)  . WISDOM TOOTH EXTRACTION  1994     Current Outpatient Medications  Medication Sig Dispense Refill  . aspirin EC 81 MG tablet Take 81 mg by mouth daily before supper.    Marland Kitchen atorvastatin (LIPITOR) 10 MG tablet TAKE 1 TABLET BY MOUTH EVERY DAY 90 tablet 3  . cefUROXime (CEFTIN) 250 MG tablet Take 250 mg by mouth 2 (two) times daily with a meal.    . chlorthalidone (HYGROTON) 25 MG tablet Take 25 mg by mouth every  3 (three) days.     . Eyelid Cleansers (OCUSOFT LID SCRUB EX) Apply topically 2 (two) times daily.     . fexofenadine (ALLEGRA) 180 MG tablet Take 180 mg by mouth daily.    . finasteride (PROSCAR) 5 MG tablet TAKE 1 TABLET BY MOUTH EVERY DAY 90 tablet 2  . GLUCOSAMINE-CHONDROITIN PO Take 1 tablet by mouth daily. Glucosamine 1500 mg/ chrondroiton 1200 mg    . lactobacillus acidophilus & bulgar (LACTINEX) chewable tablet Chew 1 tablet by mouth 3 (three) times daily with meals.    . meclizine (ANTIVERT) 25 MG tablet  Take 1 tablet (25 mg total) by mouth 2 (two) times daily as needed for dizziness. 30 tablet 0  . Multiple Vitamins-Minerals (MULTIVITAMIN WITH MINERALS) tablet Take 1 tablet by mouth daily.    . Omega-3 Fatty Acids (FISH OIL) 1200 MG CAPS Take 1 capsule by mouth at bedtime.    . potassium chloride (K-DUR) 10 MEQ tablet Take 1 tablet (10 mEq total) by mouth every other day. (Patient taking differently: Take 10 mEq by mouth every 3 (three) days. ) 90 tablet 3  . sildenafil (REVATIO) 20 MG tablet Take 1 to 5 pills as needed for erection. 100 tablet 1  . tamsulosin (FLOMAX) 0.4 MG CAPS capsule Take 2 capsules (0.8 mg total) by mouth daily. 180 capsule 3   No current facility-administered medications for this visit.    Allergies:   Patient has no known allergies.    Social History:  The patient  reports that he has quit smoking. He has never used smokeless tobacco. He reports current alcohol use. He reports that he does not use drugs.   Family History:  The patient's family history includes Colon cancer (age of onset: 5) in his paternal uncle; Colon cancer (age of onset: 87) in his paternal uncle; Heart attack in his father; Heart failure in his mother; Pancreatic cancer in his paternal grandfather and paternal grandmother; Stroke in his sister.    ROS:  Please see the history of present illness.   Otherwise, review of systems are positive for none.   All other systems are reviewed and negative.    PHYSICAL EXAM: VS:  BP 100/60   Pulse (!) 59   Ht 6' 2.5" (1.892 m)   Wt 192 lb 6.4 oz (87.3 kg)   SpO2 98%   BMI 24.37 kg/m  , BMI Body mass index is 24.37 kg/m. Affect appropriate Healthy:  appears stated age 7: normal Neck supple with no adenopathy JVP normal no bruits no thyromegaly Lungs clear with no wheezing and good diaphragmatic motion Heart:  S1/S2 no murmur, no rub, gallop or click post mini right thoracotomy PMI normal Abdomen: benighn, BS positve, no tenderness, no AAA  no bruit.  No HSM or HJR Distal pulses intact with no bruits No edema Neuro non-focal Skin warm and dry No muscular weakness   EKG:   10/05/14  EKG  SR rate 56 normal  06/18/16 SR rate 62 normal  07/06/17 SR PVC rate 62 08/11/18  SR rate 62  Normal    Recent Labs: No results found for requested labs within last 8760 hours.    Lipid Panel    Component Value Date/Time   CHOL 155 12/16/2017 1146   TRIG 104 12/16/2017 1146   HDL 41 12/16/2017 1146   CHOLHDL 3.8 12/16/2017 1146   CHOLHDL 4.1 11/20/2016 1048   VLDL 27 11/20/2016 1048   LDLCALC 93 12/16/2017 1146  Wt Readings from Last 3 Encounters:  09/01/19 192 lb 6.4 oz (87.3 kg)  08/11/18 196 lb 8 oz (89.1 kg)  07/15/18 198 lb 3.2 oz (89.9 kg)      Other studies Reviewed: Additional studies/ records that were reviewed today include: TEE/TTE., Cath Intraop TEE and OR report    Plan:   MV repair:   No MR on exam  Echo 09/01/18 with no residual MR SBE prophylaxis   Chol:  Cholesterol is at goal.  Continue current dose of statin and diet Rx.  No myalgias or side effects.  F/U  LFT's in 6 months. Lab Results  Component Value Date   LDLCALC 93 12/16/2017          Meniere's  :  F/u [place] Seems to be improving on chronic diuretic   Urology:  On flomax/proscar  for hesitancy and frequent urination at night f/u with primary for PSA    F/U in a year  Signed, Jenkins Rouge, MD  09/01/2019 8:23 AM    [facility] [place], [place], [place]  [address] Phone: [phone]; Fax: [phone]

## 2019-09-01 ENCOUNTER — Ambulatory Visit (INDEPENDENT_AMBULATORY_CARE_PROVIDER_SITE_OTHER): Payer: Medicare Other | Admitting: Cardiovascular Disease

## 2019-09-01 ENCOUNTER — Encounter: Payer: Self-pay | Admitting: Cardiovascular Disease

## 2019-09-01 ENCOUNTER — Other Ambulatory Visit: Payer: Self-pay

## 2019-09-01 VITALS — BP 100/60 | HR 59 | Ht 74.5 in | Wt 192.4 lb

## 2019-09-01 DIAGNOSIS — Z09 Encounter for follow-up examination after completed treatment for conditions other than malignant neoplasm: Secondary | ICD-10-CM | POA: Diagnosis not present

## 2019-09-01 DIAGNOSIS — Z9889 Other specified postprocedural states: Secondary | ICD-10-CM | POA: Diagnosis not present

## 2019-09-01 NOTE — Patient Instructions (Signed)
Medication Instructions:   *If you need a refill on your cardiac medications before your next appointment, please call your pharmacy*  Lab Work:  If you have labs (blood work) drawn today and your tests are completely normal, you will receive your results only by: Marland Kitchen MyChart Message (if you have MyChart) OR . A paper copy in the mail If you have any lab test that is abnormal or we need to change your treatment, we will call you to review the results.  Testing/Procedures: None ordered today.  Follow-Up: At [facility], you and your health needs are our priority.  As part of our continuing mission to provide you with exceptional heart care, we have created designated Provider Care Teams.  These Care Teams include your primary Cardiologist (physician) and Advanced Practice Providers (APPs -  Physician Assistants and Nurse Practitioners) who all work together to provide you with the care you need, when you need it.  Your next appointment:   1 year(s)  The format for your next appointment:   In Person  Provider:   You may see Jenkins Rouge, MD or one of the following Advanced Practice Providers on your designated Care Team:    Truitt Merle, NP  Cecilie Kicks, NP  Kathyrn Drown, NP

## 2019-10-30 DIAGNOSIS — L814 Other melanin hyperpigmentation: Secondary | ICD-10-CM | POA: Diagnosis not present

## 2019-10-30 DIAGNOSIS — L821 Other seborrheic keratosis: Secondary | ICD-10-CM | POA: Diagnosis not present

## 2019-10-30 DIAGNOSIS — L820 Inflamed seborrheic keratosis: Secondary | ICD-10-CM | POA: Diagnosis not present

## 2019-10-30 DIAGNOSIS — L309 Dermatitis, unspecified: Secondary | ICD-10-CM | POA: Diagnosis not present

## 2019-10-30 DIAGNOSIS — D485 Neoplasm of uncertain behavior of skin: Secondary | ICD-10-CM | POA: Diagnosis not present

## 2019-10-30 DIAGNOSIS — D229 Melanocytic nevi, unspecified: Secondary | ICD-10-CM | POA: Diagnosis not present

## 2019-11-30 ENCOUNTER — Encounter: Payer: Self-pay | Admitting: Family Medicine

## 2019-11-30 ENCOUNTER — Ambulatory Visit (INDEPENDENT_AMBULATORY_CARE_PROVIDER_SITE_OTHER): Payer: Medicare HMO | Admitting: Family Medicine

## 2019-11-30 ENCOUNTER — Other Ambulatory Visit: Payer: Self-pay

## 2019-11-30 VITALS — BP 92/62 | HR 65 | Temp 97.1°F | Wt 195.4 lb

## 2019-11-30 DIAGNOSIS — M720 Palmar fascial fibromatosis [Dupuytren]: Secondary | ICD-10-CM | POA: Diagnosis not present

## 2019-11-30 DIAGNOSIS — S8012XA Contusion of left lower leg, initial encounter: Secondary | ICD-10-CM

## 2019-11-30 NOTE — Progress Notes (Signed)
   Subjective:    Patient ID: Andrew Ayala, male    DOB: March 16, 1946, 74 y.o.   MRN: [id number]  HPI He is here for evaluation of knots in his left hand that seem to be getting larger.  He has had previous difficulty with Dupuytren's contracture on the right and did have surgery on it.  He also had some trauma to the left medial calf area and does note a lesion down there.   Review of Systems     Objective:   Physical Exam Exam of the palm of the left hand does show wound movable lesions somewhat connected to the underlying flexor tendon.  Exam of the left calf area does show evidence of recent trauma with some ecchymosis and proximal to that some swelling which is 1 x 2 cm, round smooth and movable.       Assessment & Plan:  Dupuytren's contracture of hand  Contusion of left calf, initial encounter Discussed options with him in regard to mechanical difficulty/pain/interfering with ADLs.  Recommend avoiding any procedure until he has difficulty with him.  He was comfortable with that. Explained that the contusion and subsequent proximal swelling probably related and should go away with time.  He was comfortable with that.

## 2019-12-16 ENCOUNTER — Other Ambulatory Visit: Payer: Self-pay | Admitting: Family Medicine

## 2019-12-16 DIAGNOSIS — N401 Enlarged prostate with lower urinary tract symptoms: Secondary | ICD-10-CM

## 2019-12-25 ENCOUNTER — Telehealth: Payer: Self-pay

## 2019-12-25 NOTE — Telephone Encounter (Signed)
Patient lmom wanting to know if it would be ok for him to stop taking Tamsulosin? He stated he feels better not taking it as he has been off of it for 3 days now. He's unsure whether he will experience any side affects to stopping it. Please advise whether it is ok for him to remain off this medication.

## 2019-12-25 NOTE — Telephone Encounter (Signed)
He can go ahead and stop it.  There should be no withdrawal

## 2019-12-26 NOTE — Telephone Encounter (Signed)
Lmom letting patient know per provider he could stop the medication an shouldn't have any withdrawals. Patient was asked to call the office should he start experiencing changes.

## 2019-12-26 NOTE — Telephone Encounter (Signed)
Pt was advised [facility] 

## 2020-01-22 ENCOUNTER — Other Ambulatory Visit: Payer: Self-pay | Admitting: Family Medicine

## 2020-01-22 DIAGNOSIS — E785 Hyperlipidemia, unspecified: Secondary | ICD-10-CM

## 2020-01-24 ENCOUNTER — Encounter: Payer: Self-pay | Admitting: Family Medicine

## 2020-01-24 ENCOUNTER — Other Ambulatory Visit: Payer: Self-pay

## 2020-01-24 ENCOUNTER — Ambulatory Visit (INDEPENDENT_AMBULATORY_CARE_PROVIDER_SITE_OTHER): Payer: Medicare HMO | Admitting: Family Medicine

## 2020-01-24 VITALS — BP 112/70 | HR 60 | Temp 97.8°F | Ht 74.5 in | Wt 195.2 lb

## 2020-01-24 DIAGNOSIS — H8109 Meniere's disease, unspecified ear: Secondary | ICD-10-CM

## 2020-01-24 DIAGNOSIS — Z86010 Personal history of colonic polyps: Secondary | ICD-10-CM

## 2020-01-24 DIAGNOSIS — N401 Enlarged prostate with lower urinary tract symptoms: Secondary | ICD-10-CM

## 2020-01-24 DIAGNOSIS — J309 Allergic rhinitis, unspecified: Secondary | ICD-10-CM

## 2020-01-24 DIAGNOSIS — E785 Hyperlipidemia, unspecified: Secondary | ICD-10-CM

## 2020-01-24 DIAGNOSIS — N529 Male erectile dysfunction, unspecified: Secondary | ICD-10-CM

## 2020-01-24 DIAGNOSIS — Z8249 Family history of ischemic heart disease and other diseases of the circulatory system: Secondary | ICD-10-CM | POA: Diagnosis not present

## 2020-01-24 DIAGNOSIS — Z9889 Other specified postprocedural states: Secondary | ICD-10-CM

## 2020-01-24 NOTE — Progress Notes (Signed)
Andrew Ayala is a 74 y.o. male who presents for annual wellness visit and follow-up on chronic medical conditions.  He has a history of Mnire's disease which seems to be under good control using chlorthalidone 3 times per week.  He has tried Flomax and is also on finasteride for his BPH and nocturia symptoms and unfortunately still having difficulty with nocturia.  He is no longer taking Flomax.  He has had his Covid vaccines.  Does have a family history of heart disease and presently is taking omega-3 and atorvastatin.  He does have a history of colonic polyps and is scheduled for routine follow-up on that.  There has been some difficulty with erectile dysfunction however recently his sexual activity has diminished due to his wife having difficulty with vaginal atrophy.  He does have a history of mitral valve repair but has had no difficulty with chest pain, shortness of breath, syncopal episodes.   Immunizations and Health Maintenance Immunization History  Administered Date(s) Administered  . DT (Pediatric) 01/15/1998, 05/27/2007  . Influenza Split 07/14/2001, 09/25/2003, 06/18/2012, 07/05/2013, 06/19/2014  . Influenza, High Dose Seasonal PF 06/12/2015, 05/12/2017  . Influenza-Unspecified 06/06/2016  . PPD Test 09/01/2001  . Pneumococcal Conjugate-13 08/25/2013  . Pneumococcal Polysaccharide-23 05/17/2006  . Tdap 05/27/2007, 12/02/2016  . Zoster 08/24/2008  . Zoster Recombinat (Shingrix) 06/25/2017, 10/25/2017   Health Maintenance Due  Topic Date Due  . COVID-19 Vaccine (1) Never done    Last colonoscopy: 2017 Last PSA: 3/18 Dentist: Dr. Gloriann Loan Ophtho: Dr. Elmer Sow Exercise: going to the gym   Other doctors caring for patient include: Dermatologists [place]   Advanced Directives: Yes pt. Has both living will     Depression screen:  See questionnaire below.     Depression screen [facility] 2/9 01/24/2020 12/20/2018 12/16/2017 11/20/2016 10/11/2015  Decreased Interest 0 0 0 0 0  Down,  Depressed, Hopeless 0 0 0 0 0  PHQ - 2 Score 0 0 0 0 0    Fall Screen: See Questionaire below.   Fall Risk  01/24/2020 12/20/2018 12/16/2017 11/20/2016 10/11/2015  Falls in the past year? 0 0 No No No  Number falls in past yr: 0 - - - -  Injury with Fall? 0 - - - -    ADL screen:  See questionnaire below.  Functional Status Survey: Is the patient deaf or have difficulty hearing?: No Does the patient have difficulty seeing, even when wearing glasses/contacts?: No Does the patient have difficulty concentrating, remembering, or making decisions?: No Does the patient have difficulty walking or climbing stairs?: No Does the patient have difficulty dressing or bathing?: No Does the patient have difficulty doing errands alone such as visiting a doctor's office or shopping?: No   Review of Systems  Constitutional: -, -unexpected weight change, -anorexia, -fatigue Allergy: -sneezing, -itching, -congestion Dermatology: denies changing moles, rash, lumps ENT: -runny nose, -ear pain, -sore throat,  Cardiology:  -chest pain, -palpitations, -orthopnea, Respiratory: -cough, -shortness of breath, -dyspnea on exertion, -wheezing,  Gastroenterology: -abdominal pain, -nausea, -vomiting, -diarrhea, -constipation, -dysphagia Hematology: -bleeding or bruising problems Musculoskeletal: -arthralgias, -myalgias, -joint swelling, -back pain, - Ophthalmology: -vision changes,  Urology: -dysuria, -difficulty urinating,  -urinary frequency, -urgency, incontinence Neurology: -, -numbness, , -memory loss, -falls, -dizziness    PHYSICAL EXAM:  BP 112/70   Pulse 60   Temp 97.8 F (36.6 C)   Ht 6' 2.5" (1.892 m)   Wt 195 lb 3.2 oz (88.5 kg)   BMI 24.73 kg/m   General Appearance: Alert, cooperative,  no distress, appears stated age Head: Normocephalic, without obvious abnormality, atraumatic Eyes: PERRL, conjunctiva/corneas clear, EOM's intact, Ears: Normal TM's and external ear canals Nose: Nares normal,  mucosa normal, no drainage or sinus   tenderness Throat: Lips, mucosa, and tongue normal; teeth and gums normal Neck: Supple, no lymphadenopathy, thyroid:no enlargement/tenderness/nodules; no carotid bruit or JVD Lungs: Clear to auscultation bilaterally without wheezes, rales or ronchi; respirations unlabored Heart: Regular rate and rhythm, S1 and S2 normal, no murmur, rub or gallop Abdomen: Soft, non-tender, nondistended, normoactive bowel sounds, no masses, no hepatosplenomegaly Extremities: No clubbing, cyanosis or edema Pulses: 2+ and symmetric all extremities Skin: Skin color, texture, turgor normal, no rashes or lesions Lymph nodes: Cervical, supraclavicular, and axillary nodes normal Neurologic: CNII-XII intact, normal strength, sensation and gait; reflexes 2+ and symmetric throughout   Psych: Normal mood, affect, hygiene and grooming  ASSESSMENT/PLAN: Benign prostatic hyperplasia with lower urinary tract symptoms, symptom details unspecified  Allergic rhinitis, mild  Erectile dysfunction, unspecified erectile dysfunction type  S/P minimally invasive mitral valve repair - Plan: CBC with Differential/Platelet, Comprehensive metabolic panel  Hyperlipidemia LDL goal <100 - Plan: Lipid panel  Hx of colonic polyps  Family history of heart disease - Plan: CBC with Differential/Platelet, Comprehensive metabolic panel, Lipid panel  Meniere's disease, unspecified laterality At this point he will cut back on fluids at night and if his urinary symptoms get worse, he will contact me concerning other options.  He will continue on his other medications.  Follow-up with cardiology regularly.     healthy diet and alcohol recommendations (less than or equal to 2 drinks/day) reviewed; regular seatbelt use; changing batteries in smoke detectors. Immunization recommendations discussed.  Colonoscopy recommendations reviewed.   Medicare Attestation I have personally reviewed: The patient's  medical and social history Their use of alcohol, tobacco or illicit drugs Their current medications and supplements The patient's functional ability including ADLs,fall risks, home safety risks, cognitive, and hearing and visual impairment Diet and physical activities Evidence for depression or mood disorders  The patient's weight, height, and BMI have been recorded in the chart.  I have made referrals, counseling, and provided education to the patient based on review of the above and I have provided the patient with a written personalized care plan for preventive services.     Jill Alexanders, MD   01/24/2020    Andrew Ayala is a 74 y.o. male who presents for annual wellness visit and follow-up on chronic medical conditions.  He has the following concerns:   Immunizations and Health Maintenance Immunization History  Administered Date(s) Administered  . DT (Pediatric) 01/15/1998, 05/27/2007  . Influenza Split 07/14/2001, 09/25/2003, 06/18/2012, 07/05/2013, 06/19/2014  . Influenza, High Dose Seasonal PF 06/12/2015, 05/12/2017  . Influenza-Unspecified 06/06/2016  . PPD Test 09/01/2001  . Pneumococcal Conjugate-13 08/25/2013  . Pneumococcal Polysaccharide-23 05/17/2006  . Tdap 05/27/2007, 12/02/2016  . Zoster 08/24/2008  . Zoster Recombinat (Shingrix) 06/25/2017, 10/25/2017   Health Maintenance Due  Topic Date Due  . COVID-19 Vaccine (1) Never done    Last colonoscopy: Last PSA: Dentist: Ophtho: Exercise:  Other doctors caring for patient include:  Advanced Directives:    Depression screen:  See questionnaire below.     Depression screen [facility] 2/9 01/24/2020 12/20/2018 12/16/2017 11/20/2016 10/11/2015  Decreased Interest 0 0 0 0 0  Down, Depressed, Hopeless 0 0 0 0 0  PHQ - 2 Score 0 0 0 0 0    Fall Screen: See Questionaire below.   Fall Risk  01/24/2020 12/20/2018  12/16/2017 11/20/2016 10/11/2015  Falls in the past year? 0 0 No No No  Number falls in past yr: 0 - - - -  Injury  with Fall? 0 - - - -    ADL screen:  See questionnaire below.  Functional Status Survey: Is the patient deaf or have difficulty hearing?: No Does the patient have difficulty seeing, even when wearing glasses/contacts?: No Does the patient have difficulty concentrating, remembering, or making decisions?: No Does the patient have difficulty walking or climbing stairs?: No Does the patient have difficulty dressing or bathing?: No Does the patient have difficulty doing errands alone such as visiting a doctor's office or shopping?: No   Review of Systems  Constitutional: -, -unexpected weight change, -anorexia, -fatigue Allergy: -sneezing, -itching, -congestion Dermatology: denies changing moles, rash, lumps ENT: -runny nose, -ear pain, -sore throat,  Cardiology:  -chest pain, -palpitations, -orthopnea, Respiratory: -cough, -shortness of breath, -dyspnea on exertion, -wheezing,  Gastroenterology: -abdominal pain, -nausea, -vomiting, -diarrhea, -constipation, -dysphagia Hematology: -bleeding or bruising problems Musculoskeletal: -arthralgias, -myalgias, -joint swelling, -back pain, - Ophthalmology: -vision changes,  Urology: -dysuria, -difficulty urinating,  -urinary frequency, -urgency, incontinence Neurology: -, -numbness, , -memory loss, -falls, -dizziness    PHYSICAL EXAM:  BP 112/70   Pulse 60   Temp 97.8 F (36.6 C)   Ht 6' 2.5" (1.892 m)   Wt 195 lb 3.2 oz (88.5 kg)   BMI 24.73 kg/m   General Appearance: Alert, cooperative, no distress, appears stated age Head: Normocephalic, without obvious abnormality, atraumatic Eyes: PERRL, conjunctiva/corneas clear, EOM's intact, fundi benign Ears: Normal TM's and external ear canals Nose: Nares normal, mucosa normal, no drainage or sinus   tenderness Throat: Lips, mucosa, and tongue normal; teeth and gums normal Neck: Supple, no lymphadenopathy, thyroid:no enlargement/tenderness/nodules; no carotid bruit or JVD Lungs: Clear to  auscultation bilaterally without wheezes, rales or ronchi; respirations unlabored Heart: Regular rate and rhythm, S1 and S2 normal, no murmur, rub or gallop Abdomen: Soft, non-tender, nondistended, normoactive bowel sounds, no masses, no hepatosplenomegaly Extremities: No clubbing, cyanosis or edema Pulses: 2+ and symmetric all extremities Skin: Skin color, texture, turgor normal, no rashes or lesions Lymph nodes: Cervical, supraclavicular, and axillary nodes normal Neurologic: CNII-XII intact, normal strength, sensation and gait; reflexes 2+ and symmetric throughout   Psych: Normal mood, affect, hygiene and grooming  ASSESSMENT/PLAN:    Discussed PSA screening (risks/benefits), recommended at least 30 minutes of aerobic activity at least 5 days/week; proper sunscreen use reviewed; healthy diet and alcohol recommendations (less than or equal to 2 drinks/day) reviewed; regular seatbelt use; changing batteries in smoke detectors. Immunization recommendations discussed.  Colonoscopy recommendations reviewed.   Medicare Attestation I have personally reviewed: The patient's medical and social history Their use of alcohol, tobacco or illicit drugs Their current medications and supplements The patient's functional ability including ADLs,fall risks, home safety risks, cognitive, and hearing and visual impairment Diet and physical activities Evidence for depression or mood disorders  The patient's weight, height, and BMI have been recorded in the chart.  I have made referrals, counseling, and provided education to the patient based on review of the above and I have provided the patient with a written personalized care plan for preventive services.     Jill Alexanders, MD   01/24/2020

## 2020-01-25 LAB — COMPREHENSIVE METABOLIC PANEL
ALT: 14 IU/L (ref 0–44)
AST: 17 IU/L (ref 0–40)
Albumin/Globulin Ratio: 2.3 — ABNORMAL HIGH (ref 1.2–2.2)
Albumin: 4.6 g/dL (ref 3.7–4.7)
Alkaline Phosphatase: 73 IU/L (ref 39–117)
BUN/Creatinine Ratio: 15 (ref 10–24)
BUN: 17 mg/dL (ref 8–27)
Bilirubin Total: 0.7 mg/dL (ref 0.0–1.2)
CO2: 26 mmol/L (ref 20–29)
Calcium: 9.3 mg/dL (ref 8.6–10.2)
Chloride: 103 mmol/L (ref 96–106)
Creatinine, Ser: 1.17 mg/dL (ref 0.76–1.27)
GFR calc Af Amer: 72 mL/min/{1.73_m2} (ref >59)
GFR calc non Af Amer: 62 mL/min/{1.73_m2} (ref >59)
Globulin, Total: 2 g/dL (ref 1.5–4.5)
Glucose: 97 mg/dL (ref 65–99)
Potassium: 4.1 mmol/L (ref 3.5–5.2)
Sodium: 141 mmol/L (ref 134–144)
Total Protein: 6.6 g/dL (ref 6.0–8.5)

## 2020-01-25 LAB — CBC WITH DIFFERENTIAL/PLATELET
Basophils Absolute: 0.1 10*3/uL (ref 0.0–0.2)
Basos: 1 %
EOS (ABSOLUTE): 0.5 10*3/uL — ABNORMAL HIGH (ref 0.0–0.4)
Eos: 7 %
Hematocrit: 43.4 % (ref 37.5–51.0)
Hemoglobin: 15.2 g/dL (ref 13.0–17.7)
Immature Grans (Abs): 0 10*3/uL (ref 0.0–0.1)
Immature Granulocytes: 0 %
Lymphocytes Absolute: 1.9 10*3/uL (ref 0.7–3.1)
Lymphs: 27 %
MCH: 33.3 pg — ABNORMAL HIGH (ref 26.6–33.0)
MCHC: 35 g/dL (ref 31.5–35.7)
MCV: 95 fL (ref 79–97)
Monocytes Absolute: 0.7 10*3/uL (ref 0.1–0.9)
Monocytes: 11 %
Neutrophils Absolute: 3.6 10*3/uL (ref 1.4–7.0)
Neutrophils: 54 %
Platelets: 161 10*3/uL (ref 150–450)
RBC: 4.57 x10E6/uL (ref 4.14–5.80)
RDW: 12.3 % (ref 11.6–15.4)
WBC: 6.8 10*3/uL (ref 3.4–10.8)

## 2020-01-25 LAB — LIPID PANEL
Chol/HDL Ratio: 4.2 ratio (ref 0.0–5.0)
Cholesterol, Total: 174 mg/dL (ref 100–199)
HDL: 41 mg/dL (ref >39)
LDL Chol Calc (NIH): 103 mg/dL — ABNORMAL HIGH (ref 0–99)
Triglycerides: 170 mg/dL — ABNORMAL HIGH (ref 0–149)
VLDL Cholesterol Cal: 30 mg/dL (ref 5–40)

## 2020-02-06 ENCOUNTER — Other Ambulatory Visit: Payer: Self-pay | Admitting: Family Medicine

## 2020-02-06 ENCOUNTER — Telehealth: Payer: Self-pay | Admitting: Family Medicine

## 2020-02-06 DIAGNOSIS — N401 Enlarged prostate with lower urinary tract symptoms: Secondary | ICD-10-CM

## 2020-02-06 MED ORDER — SILDENAFIL CITRATE 20 MG PO TABS: ORAL_TABLET | ORAL | 1 refills | Status: DC

## 2020-02-06 NOTE — Telephone Encounter (Signed)
Pt called and is requesting a refill on sidenafil please send to [id number] - [place], [place]

## 2020-02-23 ENCOUNTER — Other Ambulatory Visit: Payer: Self-pay | Admitting: Family Medicine

## 2020-02-26 ENCOUNTER — Ambulatory Visit: Payer: Medicare HMO | Admitting: Family Medicine

## 2020-02-26 ENCOUNTER — Other Ambulatory Visit: Payer: Self-pay

## 2020-02-26 ENCOUNTER — Ambulatory Visit (INDEPENDENT_AMBULATORY_CARE_PROVIDER_SITE_OTHER): Payer: Medicare HMO | Admitting: Family Medicine

## 2020-02-26 ENCOUNTER — Encounter: Payer: Self-pay | Admitting: Family Medicine

## 2020-02-26 VITALS — BP 106/68 | HR 65 | Temp 97.7°F | Wt 194.4 lb

## 2020-02-26 DIAGNOSIS — L989 Disorder of the skin and subcutaneous tissue, unspecified: Secondary | ICD-10-CM

## 2020-02-26 DIAGNOSIS — M720 Palmar fascial fibromatosis [Dupuytren]: Secondary | ICD-10-CM

## 2020-02-26 NOTE — Progress Notes (Signed)
° °  Subjective:    Patient ID: Andrew Ayala, male    DOB: [birth year], 74 y.o.   MRN: [id number]  HPI He does have a lesion on the dorsal surface of his left hand that is not healing and now has some surrounding slight erythema.  It is slightly tender to touch.  He also has contractures over the palmar surface of that hand that are now starting to give him some difficulty.   Review of Systems     Objective:   Physical Exam Exam of the left hand does show several palpable lesions in the palm of the hand that are firm and movable.  He also has a round 1 cm lesion that is dried in the middle with some surrounding brownish erythema.       Assessment & Plan:  Skin lesion  Dupuytren's contracture of hand I will refer to dermatology for removal of the skin lesion.  He does have an appointment for set up for June 28. I will refer him to orthopedics when he is ready to have the contracture worked on.  He is comfortable with that.

## 2020-03-18 DIAGNOSIS — L570 Actinic keratosis: Secondary | ICD-10-CM | POA: Diagnosis not present

## 2020-03-18 DIAGNOSIS — L218 Other seborrheic dermatitis: Secondary | ICD-10-CM | POA: Diagnosis not present

## 2020-03-18 DIAGNOSIS — L2084 Intrinsic (allergic) eczema: Secondary | ICD-10-CM | POA: Diagnosis not present

## 2020-03-18 DIAGNOSIS — L239 Allergic contact dermatitis, unspecified cause: Secondary | ICD-10-CM | POA: Diagnosis not present

## 2020-03-19 ENCOUNTER — Telehealth: Payer: Self-pay | Admitting: Family Medicine

## 2020-03-19 NOTE — Telephone Encounter (Signed)
Pt called and said a friend of his was prescribed Pramipexole .5mg  and said it has helped with her restless leg syndrome. Pt wanted to see if he could get a prescription for this to try for his restless leg. [facility] [address]

## 2020-03-19 NOTE — Telephone Encounter (Signed)
appt

## 2020-03-21 ENCOUNTER — Other Ambulatory Visit: Payer: Self-pay

## 2020-03-21 ENCOUNTER — Ambulatory Visit (INDEPENDENT_AMBULATORY_CARE_PROVIDER_SITE_OTHER): Payer: Medicare HMO | Admitting: Family Medicine

## 2020-03-21 ENCOUNTER — Encounter: Payer: Self-pay | Admitting: Family Medicine

## 2020-03-21 VITALS — BP 91/53 | HR 58 | Temp 98.2°F | Wt 196.6 lb

## 2020-03-21 DIAGNOSIS — G2581 Restless legs syndrome: Secondary | ICD-10-CM

## 2020-03-21 DIAGNOSIS — I959 Hypotension, unspecified: Secondary | ICD-10-CM

## 2020-03-21 DIAGNOSIS — M720 Palmar fascial fibromatosis [Dupuytren]: Secondary | ICD-10-CM | POA: Diagnosis not present

## 2020-03-21 DIAGNOSIS — Z1329 Encounter for screening for other suspected endocrine disorder: Secondary | ICD-10-CM | POA: Diagnosis not present

## 2020-03-21 NOTE — Progress Notes (Addendum)
   Subjective:    Patient ID: Andrew Ayala, male    DOB: [birth year], 74 y.o.   MRN: [id number]  HPI He is here for consult concerning multiple issues.  He states that he has a several year history of a twitching sensation in his legs as well as in his back.  This sometimes wakes him up but it also wakes his wife up.  It is intermittent in nature.  He has concerns over whether he has RLS.  Presently he is on no blood pressure medications but does use hypertonic on an as-needed basis for his Mnire's disease.  He also notes that the Dupuytren's contractures starting to cause more difficulty with fully extending his fingers.  He is having no difficulty with dizziness, syncopal episodes, heart rate changes, chest pain.  He states that his blood pressure usually runs fairly low.   Review of Systems     Objective:   Physical Exam Alert and in no distress.  Blood pressure is recorded.  Cardiac exam shows regular rhythm without murmurs or gallops.  Exam of his left hand does show lack of full extension of his fifth digit.  Round smooth nontender lesions are noted in the palm of the hand. Blood pressure is recorded.      Assessment & Plan:  Hypotension, unspecified hypotension type - Plan: TSH  Dupuytren's contracture of hand  Restless legs - Plan: Iron, Ferritin, TSH Recommend that he call his cardiologist to follow-up sooner if then several months from now due to his low blood pressure.  He presently is having no symptoms but is concerning that it is this low. I then discussed the Dupuytren's contracture and explained that since he is functionally not having any difficulty, intervention at this point would not be recommended.  He was comfortable with that. I will do some blood screening to look for underlying causes for the restless leg and may then consider placing him on an RLS medication.  He was comfortable with that.  Over 30 minutes spent discussing all these issues with him. 7/2 the  blood work is negative.  I will try him on Mirapex.

## 2020-03-22 LAB — FERRITIN: Ferritin: 174 ng/mL (ref 30–400)

## 2020-03-22 LAB — IRON: Iron: 110 ug/dL (ref 38–169)

## 2020-03-22 LAB — TSH: TSH: 2.3 u[IU]/mL (ref 0.450–4.500)

## 2020-03-22 MED ORDER — PRAMIPEXOLE DIHYDROCHLORIDE 0.125 MG PO TABS
0.1250 mg | ORAL_TABLET | Freq: Every evening | ORAL | 1 refills | Status: DC
Start: 2020-03-22 — End: 2020-05-20

## 2020-03-22 NOTE — Progress Notes (Signed)
Pt. Aware of labs and has been scheduled for a virtual f/u.

## 2020-03-22 NOTE — Addendum Note (Signed)
Addended by: Denita Lung on: 03/22/2020 11:25 AM   Modules accepted: Orders

## 2020-04-08 ENCOUNTER — Encounter: Payer: Self-pay | Admitting: Family Medicine

## 2020-04-08 ENCOUNTER — Other Ambulatory Visit: Payer: Self-pay

## 2020-04-08 ENCOUNTER — Telehealth (INDEPENDENT_AMBULATORY_CARE_PROVIDER_SITE_OTHER): Payer: Medicare HMO | Admitting: Family Medicine

## 2020-04-08 VITALS — BP 88/49 | Wt 196.0 lb

## 2020-04-08 DIAGNOSIS — Z9889 Other specified postprocedural states: Secondary | ICD-10-CM | POA: Diagnosis not present

## 2020-04-08 DIAGNOSIS — I959 Hypotension, unspecified: Secondary | ICD-10-CM | POA: Diagnosis not present

## 2020-04-08 NOTE — Progress Notes (Signed)
   Subjective:    Patient ID: Andrew Ayala, male    DOB: 1945/12/25, 74 y.o.   MRN: [id number]  HPI I connected with  Abelino Tippin on 04/08/20 by a video enabled telemedicine application and verified that I am speaking with the correct person using two identifiers. I discussed the limitations of evaluation and management by telemedicine. The patient expressed understanding and agreed to proceed. He was at home by himself and here in my office. He has been checking his blood pressure at home and states that his readings are roughly in the 85/45 range.  He does not complain of weakness, dizziness, falls, chest pain, palpitations.  He states that he has noted this since he has had the mitral valve surgery. He did change his appointment with Dr. Johnsie Cancel to August 19. Review of Systems     Objective:   Physical Exam Alert and in no distress otherwise not examined      Assessment & Plan:  S/P minimally invasive mitral valve repair  Hypotension, unspecified hypotension type He is to come in here with his blood pressure cuff to ensure how accurate it is but it seems like it is fairly accurate.  I will send a note to Dr. Nolon Lennert concerning possibly seeing him sooner.

## 2020-04-10 ENCOUNTER — Telehealth: Payer: Self-pay

## 2020-04-10 ENCOUNTER — Other Ambulatory Visit: Payer: Medicare HMO

## 2020-04-10 ENCOUNTER — Other Ambulatory Visit: Payer: Self-pay

## 2020-04-10 NOTE — Telephone Encounter (Signed)
Let him know that I discussed this with the cardiologist and lets have him stop the Flomax.  That can have an effect on his blood pressure.  Have him keep in touch with [place] on how his bladder function is doing stopping that medicine.

## 2020-04-10 NOTE — Telephone Encounter (Signed)
Pt b/p is low at 86/54 and his machine 87/51 please advise [facility]

## 2020-04-11 NOTE — Telephone Encounter (Signed)
Pt was advised [facility] 

## 2020-05-03 NOTE — Progress Notes (Signed)
Patient ID: Andrew Ayala, male   DOB: [birth year], 74 y.o.   MRN: [id number]    Cardiology Office Note   Date:  05/09/2020   ID:  Andrew Ayala, DOB [birth year], MRN [id number]  PCP:  Denita Lung, MD  Cardiologist:   Jenkins Rouge, MD   No chief complaint on file.     History of Present Illness: Andrew Ayala is a 74 y.o. male who presents for f/u MV repair 2013 transferred care from [facility]  TTE showed bi-leaflet Prolapse with severe MR. Cath 02/08/15 normal cors and right heart pressures severe MR  02/27/15 Surgical repair Roxy Manns:  Minimally-Invasive Mitral Valve Repair Complex valvuloplasty including triangular resection of posterior leaflet Artificial Gore-tex neochord placement x8 Sorin Memo 3D Ring Annuloplasty (size 105mm, catalog # [id number], serial # [id number])  Most recent echo 09/01/18 with no residual MR normal EF 60-65% no LAE and peak diastolic gradient Through valve only 3 mmHg   Son is a psychiatrist in [place] and his male partner are thinking of adopting  Has had vertigo  Seeing Dr Pixie Casino taking Meclizine and diuretic Frequent nightly urination and Flomax increased by Dr Redmond School in March 2020  No cardiac complaints  Wife active does yoga and swims at [employer]  BP has been running low TSH normal Hct 43.4 BUN 17 Cr 1.2   Feels better off Flomax. Discussed limiting diuretic use for his vertigo   He has had COVID vaccine   Past Medical History:  Diagnosis Date   Acne rosacea    Allergic rhinitis    Allergy    BPH (benign prostatic hyperplasia)    GERD (gastroesophageal reflux disease)    no meds/some reflux/takes tums   Heart murmur    Hyperlipidemia    MVP (mitral valve prolapse)    repaired   S/P minimally invasive mitral valve repair 02/27/2015   Complex valvuloplasty including triangular resection of flail segment of posterior leaflet, artificial Gore-tex neochord placement x8 and 34 mm Sorin Memo 3D Rechord  ring annuloplasty   Sensory - neural hearing loss    Severe mitral regurgitation 10/05/2014   Vasomotor rhinitis     Past Surgical History:  Procedure Laterality Date   CARDIAC CATHETERIZATION N/A 02/08/2015   Procedure: Left Heart Cath And Coronary Angiography;  Surgeon: Josue Hector, MD;  Location: [facility];  Service: Cardiovascular;  Laterality: N/A;   COLONOSCOPY  2007   Dr. Henrene Pastor   HAND SURGERY  2001   right   MITRAL VALVE REPAIR Right 02/27/2015   Procedure: MINIMALLY INVASIVE MITRAL VALVE REPAIR (MVR);  Surgeon: Rexene Alberts, MD;  Location: [facility];  Service: Open Heart Surgery;  Laterality: Right;   POLYPECTOMY     SEPTOPLASTY  1998   TEE WITHOUT CARDIOVERSION N/A 10/03/2014   Procedure: TRANSESOPHAGEAL ECHOCARDIOGRAM (TEE);  Surgeon: Josue Hector, MD;  Location: [facility];  Service: Cardiovascular;  Laterality: N/A;   TEE WITHOUT CARDIOVERSION N/A 02/27/2015   Procedure: TRANSESOPHAGEAL ECHOCARDIOGRAM (TEE);  Surgeon: Rexene Alberts, MD;  Location: [facility];  Service: Open Heart Surgery;  Laterality: N/A;   undescended testicle  1951   wart removal  1997   cryo surgery (penis)   WISDOM TOOTH EXTRACTION  1994     Current Outpatient Medications  Medication Sig Dispense Refill   AMOXICILLIN PO Take by mouth.     aspirin EC 81 MG tablet Take 81 mg by mouth daily before supper.     atorvastatin (LIPITOR) 10 MG tablet TAKE  1 TABLET BY MOUTH EVERY DAY 90 tablet 3   cefUROXime (CEFTIN) 250 MG tablet Take 250 mg by mouth 2 (two) times daily with a meal.      chlorthalidone (HYGROTON) 25 MG tablet Take 25 mg by mouth every 3 (three) days.      Eyelid Cleansers (OCUSOFT LID SCRUB EX) Apply topically 2 (two) times daily.      fexofenadine (ALLEGRA) 180 MG tablet Take 180 mg by mouth daily.     finasteride (PROSCAR) 5 MG tablet TAKE 1 TABLET BY MOUTH EVERY DAY 90 tablet 2   GLUCOSAMINE-CHONDROITIN PO Take 1 tablet by mouth daily. Glucosamine 1500 mg/  chrondroiton 1200 mg     ketoconazole (NIZORAL) 2 % shampoo Apply 1 application topically 2 (two) times a week.     lactobacillus acidophilus & bulgar (LACTINEX) chewable tablet Chew 1 tablet by mouth 3 (three) times daily with meals.      meclizine (ANTIVERT) 25 MG tablet Take 1 tablet (25 mg total) by mouth 2 (two) times daily as needed for dizziness. 30 tablet 0   Multiple Vitamins-Minerals (MULTIVITAMIN WITH MINERALS) tablet Take 1 tablet by mouth daily.     Omega-3 Fatty Acids (FISH OIL) 1200 MG CAPS Take 1 capsule by mouth at bedtime.     potassium chloride (KLOR-CON) 10 MEQ tablet TAKE 1 TABLET (10 MEQ TOTAL) BY MOUTH EVERY OTHER DAY. 45 tablet 7   pramipexole (MIRAPEX) 0.125 MG tablet Take 1 tablet (0.125 mg total) by mouth at bedtime. 30 tablet 1   sildenafil (REVATIO) 20 MG tablet Take 1 to 5 pills as needed for erection. 100 tablet 1   TRIAMCINOLONE ACETONIDE EX Apply topically.     triamcinolone cream (KENALOG) 0.1 % Apply 1 application topically 2 (two) times daily.     No current facility-administered medications for this visit.    Allergies:   Patient has no known allergies.    Social History:  The patient  reports that he has quit smoking. He has never used smokeless tobacco. He reports current alcohol use. He reports that he does not use drugs.   Family History:  The patient's family history includes Colon cancer (age of onset: 56) in his paternal uncle; Colon cancer (age of onset: 49) in his paternal uncle; Heart attack in his father; Heart failure in his mother; Pancreatic cancer in his paternal grandfather and paternal grandmother; Stroke in his sister.    ROS:  Please see the history of present illness.   Otherwise, review of systems are positive for none.   All other systems are reviewed and negative.    PHYSICAL EXAM: VS:  BP 100/62    Pulse 64    Ht 6\' 2"  (1.88 m)    Wt 192 lb (87.1 kg)    SpO2 99%    BMI 24.65 kg/m  , BMI Body mass index is 24.65  kg/m. Affect appropriate Healthy:  appears stated age 65: normal Neck supple with no adenopathy JVP normal no bruits no thyromegaly Lungs clear with no wheezing and good diaphragmatic motion Heart:  S1/S2 no murmur, no rub, gallop or click post mini right thoracotomy PMI normal Abdomen: benighn, BS positve, no tenderness, no AAA no bruit.  No HSM or HJR Distal pulses intact with no bruits No edema Neuro non-focal Skin warm and dry No muscular weakness   EKG:   10/05/14  EKG  SR rate 56 normal  06/18/16 SR rate 62 normal  07/06/17 SR PVC rate 62 08/11/18  SR rate 62  Normal    Recent Labs: 01/24/2020: ALT 14; BUN 17; Creatinine, Ser 1.17; Hemoglobin 15.2; Platelets 161; Potassium 4.1; Sodium 141 03/21/2020: TSH 2.300    Lipid Panel    Component Value Date/Time   CHOL 174 01/24/2020 1158   TRIG 170 (H) 01/24/2020 1158   HDL 41 01/24/2020 1158   CHOLHDL 4.2 01/24/2020 1158   CHOLHDL 4.1 11/20/2016 1048   VLDL 27 11/20/2016 1048   LDLCALC 103 (H) 01/24/2020 1158      Wt Readings from Last 3 Encounters:  05/09/20 192 lb (87.1 kg)  04/08/20 196 lb (88.9 kg)  03/21/20 196 lb 9.6 oz (89.2 kg)      Other studies Reviewed: Additional studies/ records that were reviewed today include: TEE/TTE., Cath Intraop TEE and OR report    Plan:   MV repair:   No MR on exam  Echo 09/01/18 with no residual MR SBE prophylaxis   Chol:  Cholesterol is at goal.  Continue current dose of statin and diet Rx.  No myalgias or side effects.  F/U  LFT's in 6 months        Meniere's  :  F/u [place] Seems to be improving on chronic diuretic   Urology:  On flomax/proscar  for hesitancy and frequent urination at night f/u with primary for PSA   Hypotension:  Not postural in clinic today However BP is low Discussed avoiding vasodilators and diuretic Consider proscar and Meclizine for his prostate issues and Meunier's  Also discussed having his primary do A cortisol stim test. Discussed Rx in  future if it worsens with midodrine / florinef. He feels better and less symptomatic off Flomax.  F/U with primary this is not a cardiac issue    F/U in a year  Signed, Jenkins Rouge, MD  05/09/2020 10:53 AM    [facility] [place], [place], [place]  [address] Phone: [phone]; Fax: [phone]

## 2020-05-09 ENCOUNTER — Ambulatory Visit: Payer: Medicare HMO | Admitting: Cardiovascular Disease

## 2020-05-09 ENCOUNTER — Other Ambulatory Visit: Payer: Self-pay

## 2020-05-09 ENCOUNTER — Encounter: Payer: Self-pay | Admitting: Cardiovascular Disease

## 2020-05-09 VITALS — BP 100/62 | HR 64 | Ht 74.0 in | Wt 192.0 lb

## 2020-05-09 DIAGNOSIS — I951 Orthostatic hypotension: Secondary | ICD-10-CM

## 2020-05-09 DIAGNOSIS — Z9889 Other specified postprocedural states: Secondary | ICD-10-CM | POA: Diagnosis not present

## 2020-05-09 NOTE — Patient Instructions (Addendum)

## 2020-05-14 ENCOUNTER — Emergency Department (HOSPITAL_COMMUNITY)
Admission: EM | Admit: 2020-05-14 | Discharge: 2020-05-14 | Disposition: A | Discharge status: Home / Self Care | Payer: Medicare HMO | Attending: Emergency Medicine | Admitting: Emergency Medicine

## 2020-05-14 ENCOUNTER — Emergency Department (HOSPITAL_COMMUNITY): Payer: Medicare HMO

## 2020-05-14 ENCOUNTER — Telehealth: Payer: Self-pay | Admitting: Cardiovascular Disease

## 2020-05-14 ENCOUNTER — Other Ambulatory Visit: Payer: Self-pay

## 2020-05-14 ENCOUNTER — Encounter (HOSPITAL_COMMUNITY): Payer: Self-pay | Admitting: Emergency Medicine

## 2020-05-14 DIAGNOSIS — J811 Chronic pulmonary edema: Secondary | ICD-10-CM | POA: Diagnosis not present

## 2020-05-14 DIAGNOSIS — Z5321 Procedure and treatment not carried out due to patient leaving prior to being seen by health care provider: Secondary | ICD-10-CM | POA: Insufficient documentation

## 2020-05-14 DIAGNOSIS — J189 Pneumonia, unspecified organism: Secondary | ICD-10-CM | POA: Diagnosis not present

## 2020-05-14 DIAGNOSIS — J449 Chronic obstructive pulmonary disease, unspecified: Secondary | ICD-10-CM | POA: Diagnosis not present

## 2020-05-14 DIAGNOSIS — R079 Chest pain, unspecified: Secondary | ICD-10-CM | POA: Insufficient documentation

## 2020-05-14 LAB — BASIC METABOLIC PANEL
Anion gap: 8 (ref 5–15)
BUN: 14 mg/dL (ref 8–23)
CO2: 29 mmol/L (ref 22–32)
Calcium: 9.5 mg/dL (ref 8.9–10.3)
Chloride: 104 mmol/L (ref 98–111)
Creatinine, Ser: 1.09 mg/dL (ref 0.61–1.24)
GFR calc Af Amer: >60 mL/min (ref >60)
GFR calc non Af Amer: >60 mL/min (ref >60)
Glucose, Bld: 82 mg/dL (ref 70–99)
Potassium: 4.1 mmol/L (ref 3.5–5.1)
Sodium: 141 mmol/L (ref 135–145)

## 2020-05-14 LAB — CBC
HCT: 46.8 % (ref 39.0–52.0)
Hemoglobin: 15.9 g/dL (ref 13.0–17.0)
MCH: 33.5 pg (ref 26.0–34.0)
MCHC: 34 g/dL (ref 30.0–36.0)
MCV: 98.7 fL (ref 80.0–100.0)
Platelets: 172 10*3/uL (ref 150–400)
RBC: 4.74 MIL/uL (ref 4.22–5.81)
RDW: 11.8 % (ref 11.5–15.5)
WBC: 5.9 10*3/uL (ref 4.0–10.5)
nRBC: 0 % (ref 0.0–0.2)

## 2020-05-14 LAB — TROPONIN I (HIGH SENSITIVITY)
Troponin I (High Sensitivity): <2 ng/L (ref <18)
Troponin I (High Sensitivity): <2 ng/L (ref <18)

## 2020-05-14 NOTE — ED Triage Notes (Signed)
Patient arrives to ED with complaints of sharp and intense left sided chest pain that woke him up from sleep this morning. The pain lasted 3-4 minutes. Pt is CP free at this time. Denies SOB, N/V/D. Pt took 81 mg ASA at home.

## 2020-05-14 NOTE — Telephone Encounter (Signed)
Spoke with pt who reports he awoke between 830am and 9am with severe chest pain in the center of his chest that radiated to his left ribcage area.  Pt states he does not have nitro but did take a baby aspirin.  Pt states pain was very intense and lasted about 10 minutes or so.  Pt denies CP right now.  Pt saw Dr Johnsie Cancel last week but an EKG was not done.  Pt advised to have someone to drive him to ED for further evaluation as they would be able to draw appropriate labs and run an EKG to look for changes.  Pt verbalizes understanding and agrees with current plan.

## 2020-05-14 NOTE — Telephone Encounter (Signed)
Pt c/o of Chest Pain: STAT if CP now or developed within 24 hours  1. Are you having CP right now? no  2. Are you experiencing any other symptoms (ex. SOB, nausea, vomiting, sweating)? no  3. How long have you been experiencing CP? This morning 9:00 am  4. Is your CP continuous or coming and going? Went away after 7 or 8 minutes  5. Have you taken Nitroglycerin? No  Patient states this morning he started having chest paint that lasted about 7 or 8 minutes. He states he took a baby aspirin for it. He states he is not having symptoms now. ?

## 2020-05-15 ENCOUNTER — Ambulatory Visit (INDEPENDENT_AMBULATORY_CARE_PROVIDER_SITE_OTHER): Payer: Medicare HMO | Admitting: Family Medicine

## 2020-05-15 ENCOUNTER — Encounter: Payer: Self-pay | Admitting: Family Medicine

## 2020-05-15 VITALS — BP 92/64 | HR 69 | Temp 98.6°F | Wt 192.6 lb

## 2020-05-15 DIAGNOSIS — I959 Hypotension, unspecified: Secondary | ICD-10-CM | POA: Diagnosis not present

## 2020-05-15 DIAGNOSIS — R079 Chest pain, unspecified: Secondary | ICD-10-CM | POA: Diagnosis not present

## 2020-05-15 DIAGNOSIS — G2581 Restless legs syndrome: Secondary | ICD-10-CM

## 2020-05-15 NOTE — Progress Notes (Signed)
   Subjective:    Patient ID: Andrew Ayala, male    DOB: [birth year], 74 y.o.   MRN: [id number]  HPI He is here for consult concerning recent trip to the emergency room for evaluation of chest pain.  He describes the pain as sharp and in mid left chest area that lasts just for several minutes.  He also describes a pleuritic component to it however he is not having any difficulty at the present time. He also states that the Mirapex is helping with his restless leg and is very happy with this. He has questions concerning blood pressure.  He has stopped taking the Flomax and has not seen a big change in his blood pressure.  He is having no syncopal episodes or dizziness.   Review of Systems     Objective:   Physical Exam  Alert and in no distress.  Cardiac exam shows regular rhythm without murmurs or gallops.  Lungs are clear to auscultation.  No chest wall tenderness. The emergency room blood work was reviewed by me and was essentially negative.      Assessment & Plan:  Chest pain, unspecified type  Hypotension, unspecified hypotension type  Restless legs I explained that since his chest pain was a very short period of time, very unlikely that it was cardiac related that plus the fact that there was a pleuritic component to it may be even less suspicious.  The troponin was negative.  I reassured him that this was not cardiac. Then discussed the RLS and expressed my thoughts that he is doing well on the Mirapex and we will continue him on that. We then discussed blood pressure.  At this time he is having no difficulty so maintain his present medication regimen is appropriate.  Over 30 minutes spent discussing all these issues with him and his wife.

## 2020-05-19 ENCOUNTER — Other Ambulatory Visit: Payer: Self-pay | Admitting: Family Medicine

## 2020-05-28 DIAGNOSIS — H01001 Unspecified blepharitis right upper eyelid: Secondary | ICD-10-CM | POA: Diagnosis not present

## 2020-05-28 DIAGNOSIS — H01002 Unspecified blepharitis right lower eyelid: Secondary | ICD-10-CM | POA: Diagnosis not present

## 2020-05-28 DIAGNOSIS — H00012 Hordeolum externum right lower eyelid: Secondary | ICD-10-CM | POA: Diagnosis not present

## 2020-11-01 DIAGNOSIS — L814 Other melanin hyperpigmentation: Secondary | ICD-10-CM | POA: Diagnosis not present

## 2020-11-01 DIAGNOSIS — L218 Other seborrheic dermatitis: Secondary | ICD-10-CM | POA: Diagnosis not present

## 2020-11-01 DIAGNOSIS — L309 Dermatitis, unspecified: Secondary | ICD-10-CM | POA: Diagnosis not present

## 2020-11-01 DIAGNOSIS — L570 Actinic keratosis: Secondary | ICD-10-CM | POA: Diagnosis not present

## 2020-11-01 DIAGNOSIS — D225 Melanocytic nevi of trunk: Secondary | ICD-10-CM | POA: Diagnosis not present

## 2020-11-01 DIAGNOSIS — L821 Other seborrheic keratosis: Secondary | ICD-10-CM | POA: Diagnosis not present

## 2020-11-01 DIAGNOSIS — L2089 Other atopic dermatitis: Secondary | ICD-10-CM | POA: Diagnosis not present

## 2020-11-05 ENCOUNTER — Other Ambulatory Visit: Payer: Self-pay | Admitting: Family Medicine

## 2020-11-05 DIAGNOSIS — N401 Enlarged prostate with lower urinary tract symptoms: Secondary | ICD-10-CM

## 2020-11-06 NOTE — Telephone Encounter (Signed)
[facility] is requesting to fill pramipexole. Please advise [facility]

## 2020-11-13 NOTE — Progress Notes (Signed)
Patient ID: Andrew Ayala, male   DOB: 1946-02-11, 75 y.o.   MRN: [id number]    Cardiology Office Note   Date:  11/20/2020   ID:  Andrew Ayala, DOB [birth year], MRN [id number]  PCP:  Denita Lung, MD  Cardiologist:   Jenkins Rouge, MD   No chief complaint on file.     History of Present Illness: Andrew Ayala is a 75 y.o. male who presents for f/u MV repair 2013 Had bi leaflet prolapse, Dr Roxy Manns 02/27/15 triangular resection, [place] neochord and sorin Memo 34 mm ring   Most recent echo 09/01/18 with no residual MR normal EF 60-65% no LAE and peak diastolic gradient Through valve only 3 mmHg   Son is a psychiatrist in [place] and his male partner are thinking of adopting  Has had low BP especially when Rx with meclizine, diuretic and flomax for prostatism and menieres   Had atypical chest pain 05/15/20 pleuritic with negative ER w/u Including CXR just COPD and no acute ECG changes negative troponin   Wife has had 2 knee replacements reently He is active walking his terrier mix "Elly" with no chest pain   Past Medical History:  Diagnosis Date  . Acne rosacea   . Allergic rhinitis   . Allergy   . BPH (benign prostatic hyperplasia)   . GERD (gastroesophageal reflux disease)    no meds/some reflux/takes tums  . Heart murmur   . Hyperlipidemia   . MVP (mitral valve prolapse)    repaired  . S/P minimally invasive mitral valve repair 02/27/2015   Complex valvuloplasty including triangular resection of flail segment of posterior leaflet, artificial Gore-tex neochord placement x8 and 34 mm Sorin Memo 3D Rechord ring annuloplasty  . Sensory - neural hearing loss   . Severe mitral regurgitation 10/05/2014  . Vasomotor rhinitis     Past Surgical History:  Procedure Laterality Date  . CARDIAC CATHETERIZATION N/A 02/08/2015   Procedure: Left Heart Cath And Coronary Angiography;  Surgeon: Josue Hector, MD;  Location: [facility];  Service: Cardiovascular;  Laterality: N/A;  .  COLONOSCOPY  2007   Dr. Henrene Pastor  . HAND SURGERY  2001   right  . MITRAL VALVE REPAIR Right 02/27/2015   Procedure: MINIMALLY INVASIVE MITRAL VALVE REPAIR (MVR);  Surgeon: Rexene Alberts, MD;  Location: [facility];  Service: Open Heart Surgery;  Laterality: Right;  . POLYPECTOMY    . SEPTOPLASTY  1998  . TEE WITHOUT CARDIOVERSION N/A 10/03/2014   Procedure: TRANSESOPHAGEAL ECHOCARDIOGRAM (TEE);  Surgeon: Josue Hector, MD;  Location: [facility];  Service: Cardiovascular;  Laterality: N/A;  . TEE WITHOUT CARDIOVERSION N/A 02/27/2015   Procedure: TRANSESOPHAGEAL ECHOCARDIOGRAM (TEE);  Surgeon: Rexene Alberts, MD;  Location: [facility];  Service: Open Heart Surgery;  Laterality: N/A;  . undescended testicle  1951  . wart removal  1997   cryo surgery (penis)  . WISDOM TOOTH EXTRACTION  1994     Current Outpatient Medications  Medication Sig Dispense Refill  . AMOXICILLIN PO Take by mouth.    Marland Kitchen aspirin EC 81 MG tablet Take 81 mg by mouth daily before supper.    Marland Kitchen atorvastatin (LIPITOR) 10 MG tablet TAKE 1 TABLET BY MOUTH EVERY DAY 90 tablet 3  . cefUROXime (CEFTIN) 250 MG tablet Take 250 mg by mouth 2 (two) times daily with a meal.     . chlorthalidone (HYGROTON) 25 MG tablet Take 25 mg by mouth every 3 (three) days.     Marland Kitchen  Eyelid Cleansers (OCUSOFT LID SCRUB EX) Apply topically 2 (two) times daily.     . fexofenadine (ALLEGRA) 180 MG tablet Take 180 mg by mouth daily.    . finasteride (PROSCAR) 5 MG tablet TAKE 1 TABLET BY MOUTH EVERY DAY 90 tablet 2  . GLUCOSAMINE-CHONDROITIN PO Take 1 tablet by mouth daily. Glucosamine 1500 mg/ chrondroiton 1200 mg    . ketoconazole (NIZORAL) 2 % shampoo Apply 1 application topically 2 (two) times a week.    . lactobacillus acidophilus & bulgar (LACTINEX) chewable tablet Chew 1 tablet by mouth 3 (three) times daily with meals.     . meclizine (ANTIVERT) 25 MG tablet Take 1 tablet (25 mg total) by mouth 2 (two) times daily as needed for dizziness. 30 tablet 0  .  Multiple Vitamins-Minerals (MULTIVITAMIN WITH MINERALS) tablet Take 1 tablet by mouth daily.    . Omega-3 Fatty Acids (FISH OIL) 1200 MG CAPS Take 1 capsule by mouth at bedtime.    . potassium chloride (KLOR-CON) 10 MEQ tablet TAKE 1 TABLET (10 MEQ TOTAL) BY MOUTH EVERY OTHER DAY. 45 tablet 7  . pramipexole (MIRAPEX) 0.125 MG tablet TAKE 1 TABLET (0.125 MG TOTAL) BY MOUTH AT BEDTIME. 90 tablet 3  . sildenafil (REVATIO) 20 MG tablet Take 1 to 5 pills as needed for erection. 100 tablet 1  . TRIAMCINOLONE ACETONIDE EX Apply topically.    . triamcinolone cream (KENALOG) 0.1 % Apply 1 application topically 2 (two) times daily.     No current facility-administered medications for this visit.    Allergies:   Patient has no known allergies.    Social History:  The patient  reports that he has quit smoking. He has never used smokeless tobacco. He reports current alcohol use. He reports that he does not use drugs.   Family History:  The patient's family history includes Colon cancer (age of onset: 71) in his paternal uncle; Colon cancer (age of onset: 49) in his paternal uncle; Heart attack in his father; Heart failure in his mother; Pancreatic cancer in his paternal grandfather and paternal grandmother; Stroke in his sister.    ROS:  Please see the history of present illness.   Otherwise, review of systems are positive for none.   All other systems are reviewed and negative.    PHYSICAL EXAM: VS:  BP (!) 100/54   Pulse 62   Ht 6\' 2"  (1.88 m)   Wt 90 kg   SpO2 98%   BMI 25.47 kg/m  , BMI Body mass index is 25.47 kg/m. Affect appropriate Healthy:  appears stated age 61: normal Neck supple with no adenopathy JVP normal no bruits no thyromegaly Lungs clear with no wheezing and good diaphragmatic motion Heart:  S1/S2 no murmur, no rub, gallop or click post mini right thoracotomy PMI normal Abdomen: benighn, BS positve, no tenderness, no AAA no bruit.  No HSM or HJR Distal pulses intact  with no bruits No edema Neuro non-focal Skin warm and dry No muscular weakness   EKG:   10/05/14  EKG  SR rate 56 normal  06/18/16 SR rate 62 normal  07/06/17 SR PVC rate 62 08/11/18  SR rate 62  Normal    Recent Labs: 01/24/2020: ALT 14 03/21/2020: TSH 2.300 05/14/2020: BUN 14; Creatinine, Ser 1.09; Hemoglobin 15.9; Platelets 172; Potassium 4.1; Sodium 141    Lipid Panel    Component Value Date/Time   CHOL 174 01/24/2020 1158   TRIG 170 (H) 01/24/2020 1158   HDL 41  01/24/2020 1158   CHOLHDL 4.2 01/24/2020 1158   CHOLHDL 4.1 11/20/2016 1048   VLDL 27 11/20/2016 1048   LDLCALC 103 (H) 01/24/2020 1158      Wt Readings from Last 3 Encounters:  11/20/20 90 kg  05/15/20 87.4 kg  05/14/20 87.1 kg      Other studies Reviewed: Additional studies/ records that were reviewed today include: TEE/TTE., Cath Intraop TEE and OR report    Plan:   MV repair:   No MR on exam  Echo 09/01/18 with no residual MR SBE prophylaxis   Chol:  Cholesterol is at goal.  Continue current dose of statin and diet Rx.  No myalgias or side effects.  F/U  LFT's in 6 months        Meniere's  :  F/u [place] Seems to be improving on chronic diuretic   Urology:  On flomax/proscar  for hesitancy and frequent urination at night f/u with primary for PSA   Hypotension:  Not cardiac a bit better off flomax   Chest Pain:  Seems to have been muscular from painting not recurrent discussed f/u stress test if recurs    F/U in a year  Signed, Jenkins Rouge, MD  11/20/2020 10:12 AM    [facility], [place], [place]  [address] Phone: [phone]; Fax: [phone]

## 2020-11-18 DIAGNOSIS — H2513 Age-related nuclear cataract, bilateral: Secondary | ICD-10-CM | POA: Diagnosis not present

## 2020-11-18 DIAGNOSIS — H524 Presbyopia: Secondary | ICD-10-CM | POA: Diagnosis not present

## 2020-11-20 ENCOUNTER — Encounter: Payer: Self-pay | Admitting: Cardiovascular Disease

## 2020-11-20 ENCOUNTER — Other Ambulatory Visit: Payer: Self-pay

## 2020-11-20 ENCOUNTER — Ambulatory Visit: Payer: Medicare HMO | Admitting: Cardiovascular Disease

## 2020-11-20 VITALS — BP 100/54 | HR 62 | Ht 74.0 in | Wt 198.4 lb

## 2020-11-20 DIAGNOSIS — Z9889 Other specified postprocedural states: Secondary | ICD-10-CM

## 2020-11-20 NOTE — Patient Instructions (Signed)

## 2020-12-31 ENCOUNTER — Other Ambulatory Visit: Payer: Self-pay | Admitting: Family Medicine

## 2020-12-31 DIAGNOSIS — E785 Hyperlipidemia, unspecified: Secondary | ICD-10-CM

## 2021-01-29 ENCOUNTER — Ambulatory Visit: Payer: Medicare HMO | Admitting: Family Medicine

## 2021-01-30 ENCOUNTER — Other Ambulatory Visit: Payer: Self-pay

## 2021-01-30 ENCOUNTER — Encounter: Payer: Self-pay | Admitting: Family Medicine

## 2021-01-30 ENCOUNTER — Ambulatory Visit (INDEPENDENT_AMBULATORY_CARE_PROVIDER_SITE_OTHER): Payer: Medicare HMO | Admitting: Family Medicine

## 2021-01-30 VITALS — BP 100/68 | HR 60 | Temp 97.1°F | Ht 74.0 in | Wt 194.0 lb

## 2021-01-30 DIAGNOSIS — Z9889 Other specified postprocedural states: Secondary | ICD-10-CM | POA: Diagnosis not present

## 2021-01-30 DIAGNOSIS — M720 Palmar fascial fibromatosis [Dupuytren]: Secondary | ICD-10-CM

## 2021-01-30 DIAGNOSIS — Z0000 Encounter for general adult medical examination without abnormal findings: Secondary | ICD-10-CM | POA: Diagnosis not present

## 2021-01-30 DIAGNOSIS — H8109 Meniere's disease, unspecified ear: Secondary | ICD-10-CM

## 2021-01-30 DIAGNOSIS — J309 Allergic rhinitis, unspecified: Secondary | ICD-10-CM

## 2021-01-30 DIAGNOSIS — E785 Hyperlipidemia, unspecified: Secondary | ICD-10-CM | POA: Diagnosis not present

## 2021-01-30 DIAGNOSIS — N401 Enlarged prostate with lower urinary tract symptoms: Secondary | ICD-10-CM

## 2021-01-30 DIAGNOSIS — N529 Male erectile dysfunction, unspecified: Secondary | ICD-10-CM | POA: Diagnosis not present

## 2021-01-30 DIAGNOSIS — Z8249 Family history of ischemic heart disease and other diseases of the circulatory system: Secondary | ICD-10-CM

## 2021-01-30 DIAGNOSIS — G2581 Restless legs syndrome: Secondary | ICD-10-CM

## 2021-01-30 DIAGNOSIS — Z86010 Personal history of colonic polyps: Secondary | ICD-10-CM | POA: Diagnosis not present

## 2021-01-30 MED ORDER — PRAMIPEXOLE DIHYDROCHLORIDE 0.125 MG PO TABS: 0.1250 mg | ORAL_TABLET | Freq: Every evening | ORAL | 3 refills | Status: DC

## 2021-01-30 MED ORDER — ATORVASTATIN CALCIUM 10 MG PO TABS: 1.0000 | ORAL_TABLET | Freq: Every day | ORAL | 3 refills | Status: DC

## 2021-01-30 MED ORDER — CHLORTHALIDONE 25 MG PO TABS: 25.0000 mg | ORAL_TABLET | Freq: Every day | ORAL | 3 refills | Status: DC

## 2021-01-30 NOTE — Progress Notes (Signed)
Andrew Ayala is a 75 y.o. male who presents for annual wellness visit,CPE and follow-up on chronic medical conditions.  He has had difficulty with his left fifth finger and ability to grip properly.  He does have evidence of Dupuytren's contracture and would like to be referred to hand specialist for this.  He has a history of colonic polyps and is scheduled for routine follow-up on that.  He continues have difficulty with his BPH symptoms with nocturia, hesitancy, incomplete emptying and decreased stream.  He is continuing on finasteride.  We did try Flomax on him but apparently it affected his blood pressure and we had to stop it.  He continues on Mirapex for his RLS and is getting good results with that.  Continues on Lipitor without difficulty.  He does have some sildenafil for ED but there is some issues with he and his wife in terms of her having some vaginal dryness so sexual activity has been nonexistent for the last 2 years.  His allergies are not causing much trouble.  He follows up regularly with cardiology and is having no chest pain, shortness of breath.  He takes chlorthalidone intermittently for his Mnire's disease and is doing well with that.   Immunizations and Health Maintenance Immunization History  Administered Date(s) Administered  . DT (Pediatric) 01/15/1998, 05/27/2007  . Influenza Split 07/14/2001, 09/25/2003, 06/18/2012, 07/05/2013, 06/19/2014  . Influenza, High Dose Seasonal PF 06/12/2015, 05/12/2017, 05/27/2018, 05/28/2020  . Influenza, Quadrivalent, Recombinant, Inj, Pf 06/20/2019  . Influenza-Unspecified 06/06/2016  . Moderna Sars-Covid-2 Vaccination 10/20/2019, 11/17/2019, 07/22/2020, 01/03/2021  . PPD Test 09/01/2001  . Pneumococcal Conjugate-13 08/25/2013  . Pneumococcal Polysaccharide-23 05/17/2006  . Tdap 05/27/2007, 12/02/2016  . Zoster 08/24/2008  . Zoster Recombinat (Shingrix) 06/25/2017, 10/25/2017   There are no preventive care reminders to display for  this patient.  Last colonoscopy: 07/09/17 Last PSA: 11/20/16 Dentist: Q six months Ophtho: Q year  Exercise: walking treadmill, yoga at least 50 min a day  Other doctors caring for patient include: lupton dema, Dr. Johnsie Cancel cardio, Dr. Valetta Close eye, Dr. Lucia Gaskins ENT  Advanced Directives: Does Patient Have a Medical Advance Directive?: Yes Type of Advance Directive: [place] will Does patient want to make changes to medical advance directive?: No - Patient declined Copy of [place] in Chart?: Yes - validated most recent copy scanned in chart (See row information)  Depression screen:  See questionnaire below.     Depression screen [facility] 2/9 01/30/2021 03/21/2020 01/24/2020 12/20/2018 12/16/2017  Decreased Interest 0 0 0 0 0  Down, Depressed, Hopeless 0 0 0 0 0  PHQ - 2 Score 0 0 0 0 0    Fall Screen: See Questionaire below.   Fall Risk  01/30/2021 03/21/2020 01/24/2020 12/20/2018 12/16/2017  Falls in the past year? 0 0 0 0 No  Number falls in past yr: 0 - 0 - -  Injury with Fall? 0 - 0 - -  Risk for fall due to : No Fall Risks - - - -  Follow up Falls evaluation completed - - - -    ADL screen:  See questionnaire below.  Functional Status Survey: Is the patient deaf or have difficulty hearing?: No Does the patient have difficulty seeing, even when wearing glasses/contacts?: No Does the patient have difficulty concentrating, remembering, or making decisions?: No Does the patient have difficulty walking or climbing stairs?: No Does the patient have difficulty dressing or bathing?: No Does the patient have difficulty doing errands  alone such as visiting a doctor's office or shopping?: No   Review of Systems  Constitutional: -, -unexpected weight change, -anorexia, -fatigue Allergy: -sneezing, -itching, -congestion Dermatology: denies changing moles, rash, lumps ENT: -runny nose, -ear pain, -sore throat,  Cardiology:  -chest pain, -palpitations,  -orthopnea, Respiratory: -cough, -shortness of breath, -dyspnea on exertion, -wheezing,  Gastroenterology: -abdominal pain, -nausea, -vomiting, -diarrhea, -constipation, -dysphagia Hematology: -bleeding or bruising problems Musculoskeletal: -arthralgias, -myalgias, -joint swelling, -back pain, - Ophthalmology: -vision changes,  Urology: -dysuria, -difficulty urinating,  -urinary frequency, -urgency, incontinence Neurology: -, -numbness, , -memory loss, -falls, -dizziness    PHYSICAL EXAM:   General Appearance: Alert, cooperative, no distress, appears stated age Head: Normocephalic, without obvious abnormality, atraumatic Eyes: PERRL, conjunctiva/corneas clear, EOM's intact, fundi benign Ears: Normal TM's and external ear canals Nose: Nares normal, mucosa normal, no drainage or sinus   tenderness Throat: Lips, mucosa, and tongue normal; teeth and gums normal Neck: Supple, no lymphadenopathy, thyroid:no enlargement/tenderness/nodules; no carotid bruit or JVD Lungs: Clear to auscultation bilaterally without wheezes, rales or ronchi; respirations unlabored Heart: Regular rate and rhythm, S1 and S2 normal, no murmur, rub or gallop Abdomen: Soft, non-tender, nondistended, normoactive bowel sounds, no masses, no hepatosplenomegaly Extremities: No clubbing, cyanosis or edema Pulses: 2+ and symmetric all extremities Skin: Skin color, texture, turgor normal, no rashes or lesions Lymph nodes: Cervical, supraclavicular, and axillary nodes normal Neurologic: CNII-XII intact, normal strength, sensation and gait; reflexes 2+ and symmetric throughout   Psych: Normal mood, affect, hygiene and grooming  ASSESSMENT/PLAN: Routine general medical examination at a health care facility - Plan: CBC with Differential/Platelet, Comprehensive metabolic panel, Lipid panel  Dupuytren's contracture of hand - Plan: Ambulatory referral to Orthopedic Surgery  Hyperlipidemia LDL goal <100 - Plan: atorvastatin  (LIPITOR) 10 MG tablet, Lipid panel  Hx of colonic polyps  Erectile dysfunction, unspecified erectile dysfunction type  Allergic rhinitis, mild - Plan: CBC with Differential/Platelet, Comprehensive metabolic panel  S/P minimally invasive mitral valve repair  Benign prostatic hyperplasia with lower urinary tract symptoms, symptom details unspecified - Plan: Ambulatory referral to Urology  Family history of heart disease - Plan: CBC with Differential/Platelet, Comprehensive metabolic panel, Lipid panel  Meniere's disease, unspecified laterality - Plan: chlorthalidone (HYGROTON) 25 MG tablet  RLS (restless legs syndrome) - Plan: pramipexole (MIRAPEX) 0.125 MG tablet Recommend that he discuss his ED with the urologist and also with the wife and mention the possibility of vaginal lubricants to help with this.   Immunization recommendations discussed.  Colonoscopy recommendations reviewed.   Medicare Attestation I have personally reviewed: The patient's medical and social history Their use of alcohol, tobacco or illicit drugs Their current medications and supplements The patient's functional ability including ADLs,fall risks, home safety risks, cognitive, and hearing and visual impairment Diet and physical activities Evidence for depression or mood disorders  The patient's weight, height, and BMI have been recorded in the chart.  I have made referrals, counseling, and provided education to the patient based on review of the above and I have provided the patient with a written personalized care plan for preventive services.     Jill Alexanders, MD   01/30/2021

## 2021-01-30 NOTE — Patient Instructions (Signed)
  Mr. Branch , Thank you for taking time to come for your Medicare Wellness Visit. I appreciate your ongoing commitment to your health goals. Please review the following plan we discussed and let me know if I can assist you in the future.   These are the goals we discussed: Goals   None     This is a list of the screening recommended for you and due dates:  Health Maintenance  Topic Date Due  . Flu Shot  04/21/2021  . Colon Cancer Screening  07/09/2022  . Tetanus Vaccine  12/03/2026  . COVID-19 Vaccine  Completed  . Hepatitis C Screening: USPSTF Recommendation to screen - Ages 39-79 yo.  Completed  . HPV Vaccine  Aged Out  . Pneumonia vaccines  Discontinued

## 2021-01-31 LAB — COMPREHENSIVE METABOLIC PANEL
ALT: 17 IU/L (ref 0–44)
AST: 16 IU/L (ref 0–40)
Albumin/Globulin Ratio: 1.9 (ref 1.2–2.2)
Albumin: 4.5 g/dL (ref 3.7–4.7)
Alkaline Phosphatase: 77 IU/L (ref 44–121)
BUN/Creatinine Ratio: 16 (ref 10–24)
BUN: 17 mg/dL (ref 8–27)
Bilirubin Total: 0.7 mg/dL (ref 0.0–1.2)
CO2: 23 mmol/L (ref 20–29)
Calcium: 9.5 mg/dL (ref 8.6–10.2)
Chloride: 102 mmol/L (ref 96–106)
Creatinine, Ser: 1.04 mg/dL (ref 0.76–1.27)
Globulin, Total: 2.4 g/dL (ref 1.5–4.5)
Glucose: 89 mg/dL (ref 65–99)
Potassium: 4.5 mmol/L (ref 3.5–5.2)
Sodium: 139 mmol/L (ref 134–144)
Total Protein: 6.9 g/dL (ref 6.0–8.5)
eGFR: 76 mL/min/{1.73_m2} (ref >59)

## 2021-01-31 LAB — LIPID PANEL
Chol/HDL Ratio: 4.3 ratio (ref 0.0–5.0)
Cholesterol, Total: 163 mg/dL (ref 100–199)
HDL: 38 mg/dL — ABNORMAL LOW (ref >39)
LDL Chol Calc (NIH): 100 mg/dL — ABNORMAL HIGH (ref 0–99)
Triglycerides: 139 mg/dL (ref 0–149)
VLDL Cholesterol Cal: 25 mg/dL (ref 5–40)

## 2021-01-31 LAB — CBC WITH DIFFERENTIAL/PLATELET
Basophils Absolute: 0.1 10*3/uL (ref 0.0–0.2)
Basos: 1 %
EOS (ABSOLUTE): 0.3 10*3/uL (ref 0.0–0.4)
Eos: 6 %
Hematocrit: 45.3 % (ref 37.5–51.0)
Hemoglobin: 15.5 g/dL (ref 13.0–17.7)
Immature Grans (Abs): 0 10*3/uL (ref 0.0–0.1)
Immature Granulocytes: 0 %
Lymphocytes Absolute: 2 10*3/uL (ref 0.7–3.1)
Lymphs: 35 %
MCH: 33.2 pg — ABNORMAL HIGH (ref 26.6–33.0)
MCHC: 34.2 g/dL (ref 31.5–35.7)
MCV: 97 fL (ref 79–97)
Monocytes Absolute: 0.6 10*3/uL (ref 0.1–0.9)
Monocytes: 10 %
Neutrophils Absolute: 2.8 10*3/uL (ref 1.4–7.0)
Neutrophils: 48 %
Platelets: 170 10*3/uL (ref 150–450)
RBC: 4.67 x10E6/uL (ref 4.14–5.80)
RDW: 12.3 % (ref 11.6–15.4)
WBC: 5.8 10*3/uL (ref 3.4–10.8)

## 2021-02-20 ENCOUNTER — Telehealth: Payer: Self-pay

## 2021-02-20 MED ORDER — AMOXICILLIN 500 MG PO CAPS: ORAL_CAPSULE | ORAL | 0 refills | Status: DC

## 2021-02-20 NOTE — Telephone Encounter (Signed)
Pt left message that he needs refill on Amoxicillin for that he takes prior to getting his teeth cleaned.  He states he's contacted his cardiologist and they were not any help.  States he's not sure if he takes 4 or how many he takes.  Please let him know

## 2021-02-20 NOTE — Telephone Encounter (Signed)
I called the antibiotic in.  He is to take all 4 pills before the procedure.  Roughly an hour

## 2021-02-23 ENCOUNTER — Telehealth: Payer: Self-pay

## 2021-02-23 NOTE — Telephone Encounter (Signed)
Called pt and informed

## 2021-02-24 ENCOUNTER — Telehealth: Payer: Self-pay

## 2021-02-24 NOTE — Telephone Encounter (Signed)
Pt was informed.

## 2021-02-24 NOTE — Telephone Encounter (Signed)
error 

## 2021-02-27 DIAGNOSIS — M720 Palmar fascial fibromatosis [Dupuytren]: Secondary | ICD-10-CM | POA: Diagnosis not present

## 2021-03-19 ENCOUNTER — Other Ambulatory Visit: Payer: Self-pay

## 2021-03-19 DIAGNOSIS — M720 Palmar fascial fibromatosis [Dupuytren]: Secondary | ICD-10-CM | POA: Diagnosis not present

## 2021-03-19 DIAGNOSIS — G8918 Other acute postprocedural pain: Secondary | ICD-10-CM | POA: Diagnosis not present

## 2021-03-28 DIAGNOSIS — M25642 Stiffness of left hand, not elsewhere classified: Secondary | ICD-10-CM | POA: Diagnosis not present

## 2021-04-04 DIAGNOSIS — M25642 Stiffness of left hand, not elsewhere classified: Secondary | ICD-10-CM | POA: Diagnosis not present

## 2021-04-08 DIAGNOSIS — M25642 Stiffness of left hand, not elsewhere classified: Secondary | ICD-10-CM | POA: Diagnosis not present

## 2021-04-16 DIAGNOSIS — M25642 Stiffness of left hand, not elsewhere classified: Secondary | ICD-10-CM | POA: Diagnosis not present

## 2021-04-17 ENCOUNTER — Other Ambulatory Visit: Payer: Self-pay | Admitting: Family Medicine

## 2021-04-18 NOTE — Telephone Encounter (Signed)
Request for refill on the pts. Amoxicillin last apt 01/30/21 and next apt is 02/02/22.

## 2021-04-23 DIAGNOSIS — M25642 Stiffness of left hand, not elsewhere classified: Secondary | ICD-10-CM | POA: Diagnosis not present

## 2021-04-30 DIAGNOSIS — M25642 Stiffness of left hand, not elsewhere classified: Secondary | ICD-10-CM | POA: Diagnosis not present

## 2021-05-06 DIAGNOSIS — M25642 Stiffness of left hand, not elsewhere classified: Secondary | ICD-10-CM | POA: Diagnosis not present

## 2021-05-20 DIAGNOSIS — M25642 Stiffness of left hand, not elsewhere classified: Secondary | ICD-10-CM | POA: Diagnosis not present

## 2021-05-28 DIAGNOSIS — M25642 Stiffness of left hand, not elsewhere classified: Secondary | ICD-10-CM | POA: Diagnosis not present

## 2021-06-03 DIAGNOSIS — M25642 Stiffness of left hand, not elsewhere classified: Secondary | ICD-10-CM | POA: Diagnosis not present

## 2021-06-24 DIAGNOSIS — M25642 Stiffness of left hand, not elsewhere classified: Secondary | ICD-10-CM | POA: Diagnosis not present

## 2021-07-01 DIAGNOSIS — M25642 Stiffness of left hand, not elsewhere classified: Secondary | ICD-10-CM | POA: Diagnosis not present

## 2021-07-08 DIAGNOSIS — M25642 Stiffness of left hand, not elsewhere classified: Secondary | ICD-10-CM | POA: Diagnosis not present

## 2021-07-15 DIAGNOSIS — Z4789 Encounter for other orthopedic aftercare: Secondary | ICD-10-CM | POA: Diagnosis not present

## 2021-07-15 DIAGNOSIS — M25642 Stiffness of left hand, not elsewhere classified: Secondary | ICD-10-CM | POA: Diagnosis not present

## 2021-07-15 DIAGNOSIS — M720 Palmar fascial fibromatosis [Dupuytren]: Secondary | ICD-10-CM | POA: Diagnosis not present

## 2021-07-16 DIAGNOSIS — M25642 Stiffness of left hand, not elsewhere classified: Secondary | ICD-10-CM | POA: Diagnosis not present

## 2021-08-07 ENCOUNTER — Other Ambulatory Visit: Payer: Self-pay | Admitting: Family Medicine

## 2021-08-07 DIAGNOSIS — N401 Enlarged prostate with lower urinary tract symptoms: Secondary | ICD-10-CM

## 2021-08-20 DIAGNOSIS — M25642 Stiffness of left hand, not elsewhere classified: Secondary | ICD-10-CM | POA: Diagnosis not present

## 2021-08-25 DIAGNOSIS — M25642 Stiffness of left hand, not elsewhere classified: Secondary | ICD-10-CM | POA: Diagnosis not present

## 2021-08-27 DIAGNOSIS — M25642 Stiffness of left hand, not elsewhere classified: Secondary | ICD-10-CM | POA: Diagnosis not present

## 2021-09-02 DIAGNOSIS — M25642 Stiffness of left hand, not elsewhere classified: Secondary | ICD-10-CM | POA: Diagnosis not present

## 2021-09-05 DIAGNOSIS — M25642 Stiffness of left hand, not elsewhere classified: Secondary | ICD-10-CM | POA: Diagnosis not present

## 2021-09-08 DIAGNOSIS — M25642 Stiffness of left hand, not elsewhere classified: Secondary | ICD-10-CM | POA: Diagnosis not present

## 2021-09-10 DIAGNOSIS — M25642 Stiffness of left hand, not elsewhere classified: Secondary | ICD-10-CM | POA: Diagnosis not present

## 2021-09-25 DIAGNOSIS — M720 Palmar fascial fibromatosis [Dupuytren]: Secondary | ICD-10-CM | POA: Diagnosis not present

## 2021-10-24 ENCOUNTER — Other Ambulatory Visit: Payer: Self-pay | Admitting: Family Medicine

## 2021-10-24 DIAGNOSIS — N401 Enlarged prostate with lower urinary tract symptoms: Secondary | ICD-10-CM

## 2021-11-03 DIAGNOSIS — D224 Melanocytic nevi of scalp and neck: Secondary | ICD-10-CM | POA: Diagnosis not present

## 2021-11-03 DIAGNOSIS — L821 Other seborrheic keratosis: Secondary | ICD-10-CM | POA: Diagnosis not present

## 2021-11-03 DIAGNOSIS — D225 Melanocytic nevi of trunk: Secondary | ICD-10-CM | POA: Diagnosis not present

## 2021-11-03 DIAGNOSIS — L814 Other melanin hyperpigmentation: Secondary | ICD-10-CM | POA: Diagnosis not present

## 2021-11-11 ENCOUNTER — Telehealth: Payer: Self-pay | Admitting: Family Medicine

## 2021-11-11 NOTE — Telephone Encounter (Signed)
Pt wants to be referred to Dr Diona Fanti with [facility], states he discussed this with you at his last visit

## 2021-11-12 ENCOUNTER — Other Ambulatory Visit: Payer: Self-pay

## 2021-11-12 DIAGNOSIS — N401 Enlarged prostate with lower urinary tract symptoms: Secondary | ICD-10-CM

## 2021-11-12 NOTE — Telephone Encounter (Signed)
Done KH 

## 2021-11-13 NOTE — Progress Notes (Signed)
Patient ID: Andrew Ayala, male   DOB: 1946/03/09, 76 y.o.   MRN: [id number]    Cardiology Office Note   Date:  11/13/2021   ID:  Andrew Ayala, DOB 1946-06-17, MRN [id number]  PCP:  Denita Lung, MD  Cardiologist:   Jenkins Rouge, MD   No chief complaint on file.     History of Present Illness: Andrew Ayala is a 76 y.o. male who presents for f/u MV repair 2013 Had bi leaflet prolapse, Dr Roxy Manns 02/27/15 triangular resection, [place] neochord and sorin Memo 34 mm ring   Most recent echo 09/01/18 with no residual MR normal EF 60-65% no LAE and peak diastolic gradient Through valve only 3 mmHg   Son is a psychiatrist in [place] and his male partner are thinking of adopting  Has had low BP especially when Rx with meclizine, diuretic and flomax for prostatism and menieres   Had atypical chest pain 05/15/20 pleuritic with negative ER w/u Including CXR just COPD and no acute ECG changes negative troponin   Wife has had 2 knee replacements  He is active walking his terrier mix "Elly" with no chest pain   Most of his issues now are non cardiac with prostatism , colonic polyps and Dupuytren's contracture and RLS now on Mirapex   Has had some dysphagia with food getting stuck 3 times  Has had 3 episodes of chest pain while working out Seem muscular  But SSCP with exertion lasting minutes resolved with rest    Past Medical History:  Diagnosis Date   Acne rosacea    Allergic rhinitis    Allergy    BPH (benign prostatic hyperplasia)    GERD (gastroesophageal reflux disease)    no meds/some reflux/takes tums   Heart murmur    Hyperlipidemia    MVP (mitral valve prolapse)    repaired   S/P minimally invasive mitral valve repair 02/27/2015   Complex valvuloplasty including triangular resection of flail segment of posterior leaflet, artificial Gore-tex neochord placement x8 and 34 mm Sorin Memo 3D Rechord ring annuloplasty   Sensory - neural hearing loss    Severe mitral regurgitation  10/05/2014   Vasomotor rhinitis     Past Surgical History:  Procedure Laterality Date   CARDIAC CATHETERIZATION N/A 02/08/2015   Procedure: Left Heart Cath And Coronary Angiography;  Surgeon: Josue Hector, MD;  Location: [place] [place] [facility];  Service: Cardiovascular;  Laterality: N/A;   COLONOSCOPY  2007   Dr. Henrene Pastor   HAND SURGERY  2001   right   MITRAL VALVE REPAIR Right 02/27/2015   Procedure: MINIMALLY INVASIVE MITRAL VALVE REPAIR (MVR);  Surgeon: Rexene Alberts, MD;  Location: [facility];  Service: Open Heart Surgery;  Laterality: Right;   POLYPECTOMY     SEPTOPLASTY  1998   TEE WITHOUT CARDIOVERSION N/A 10/03/2014   Procedure: TRANSESOPHAGEAL ECHOCARDIOGRAM (TEE);  Surgeon: Josue Hector, MD;  Location: [facility];  Service: Cardiovascular;  Laterality: N/A;   TEE WITHOUT CARDIOVERSION N/A 02/27/2015   Procedure: TRANSESOPHAGEAL ECHOCARDIOGRAM (TEE);  Surgeon: Rexene Alberts, MD;  Location: [facility];  Service: Open Heart Surgery;  Laterality: N/A;   undescended testicle  1951   wart removal  1997   cryo surgery (penis)   WISDOM TOOTH EXTRACTION  1994     Current Outpatient Medications  Medication Sig Dispense Refill   amoxicillin (AMOXIL) 500 MG capsule TAKE ALL PILLS AN HOUR BEFORE THE DENTAL PROCEDURE 4 capsule 0   aspirin EC 81 MG tablet  Take 81 mg by mouth daily before supper.     atorvastatin (LIPITOR) 10 MG tablet Take 1 tablet (10 mg total) by mouth daily. 90 tablet 3   chlorthalidone (HYGROTON) 25 MG tablet Take 1 tablet (25 mg total) by mouth daily. 90 tablet 3   Eyelid Cleansers (OCUSOFT LID SCRUB EX) Apply topically 2 (two) times daily.      fexofenadine (ALLEGRA) 180 MG tablet Take 180 mg by mouth daily. (Patient not taking: Reported on 01/30/2021)     finasteride (PROSCAR) 5 MG tablet TAKE 1 TABLET BY MOUTH EVERY DAY 90 tablet 0   GLUCOSAMINE-CHONDROITIN PO Take 1 tablet by mouth daily. Glucosamine 1500 mg/ chrondroiton 1200 mg     ketoconazole (NIZORAL) 2 % shampoo Apply  1 application topically 2 (two) times a week.     lactobacillus acidophilus & bulgar (LACTINEX) chewable tablet Chew 1 tablet by mouth 3 (three) times daily with meals.  (Patient not taking: Reported on 01/30/2021)     meclizine (ANTIVERT) 25 MG tablet Take 1 tablet (25 mg total) by mouth 2 (two) times daily as needed for dizziness. (Patient not taking: Reported on 01/30/2021) 30 tablet 0   Multiple Vitamins-Minerals (MULTIVITAMIN WITH MINERALS) tablet Take 1 tablet by mouth daily.     Omega-3 Fatty Acids (FISH OIL) 1200 MG CAPS Take 1 capsule by mouth at bedtime.     potassium chloride (KLOR-CON) 10 MEQ tablet TAKE 1 TABLET (10 MEQ TOTAL) BY MOUTH EVERY OTHER DAY. 45 tablet 7   pramipexole (MIRAPEX) 0.125 MG tablet Take 1 tablet (0.125 mg total) by mouth at bedtime. 90 tablet 3   sildenafil (REVATIO) 20 MG tablet Take 1 to 5 pills as needed for erection. 100 tablet 1   TRIAMCINOLONE ACETONIDE EX Apply topically.     triamcinolone cream (KENALOG) 0.1 % Apply 1 application topically 2 (two) times daily.     No current facility-administered medications for this visit.    Allergies:   Patient has no known allergies.    Social History:  The patient  reports that he has quit smoking. He has never used smokeless tobacco. He reports current alcohol use. He reports that he does not use drugs.   Family History:  The patient's family history includes Colon cancer (age of onset: 37) in his paternal uncle; Colon cancer (age of onset: 75) in his paternal uncle; Heart attack in his father; Heart failure in his mother; Pancreatic cancer in his paternal grandfather and paternal grandmother; Stroke in his sister.    ROS:  Please see the history of present illness.   Otherwise, review of systems are positive for none.   All other systems are reviewed and negative.    PHYSICAL EXAM: VS:  There were no vitals taken for this visit. , BMI There is no height or weight on file to calculate BMI. Affect  appropriate Healthy:  appears stated age 51: normal Neck supple with no adenopathy JVP normal no bruits no thyromegaly Lungs clear with no wheezing and good diaphragmatic motion Heart:  S1/S2 no murmur, no rub, gallop or click post mini right thoracotomy PMI normal Abdomen: benighn, BS positve, no tenderness, no AAA no bruit.  No HSM or HJR Distal pulses intact with no bruits No edema Neuro non-focal Skin warm and dry Left DP surgery scar new old on right palm    EKG:  11/18/2021 NSR rate 69 normal    Recent Labs: 01/30/2021: ALT 17; BUN 17; Creatinine, Ser 1.04; Hemoglobin 15.5; Platelets 170;  Potassium 4.5; Sodium 139    Lipid Panel    Component Value Date/Time   CHOL 163 01/30/2021 1630   TRIG 139 01/30/2021 1630   HDL 38 (L) 01/30/2021 1630   CHOLHDL 4.3 01/30/2021 1630   CHOLHDL 4.1 11/20/2016 1048   VLDL 27 11/20/2016 1048   LDLCALC 100 (H) 01/30/2021 1630      Wt Readings from Last 3 Encounters:  01/30/21 194 lb (88 kg)  11/20/20 198 lb 6.4 oz (90 kg)  05/15/20 192 lb 9.6 oz (87.4 kg)      Other studies Reviewed: Additional studies/ records that were reviewed today include: TEE/TTE., Cath Intraop TEE and OR report    Plan:   MV repair:   No MR on exam  Echo 09/01/18 with no residual MR SBE prophylaxis   Chol:  Cholesterol is at goal.  Continue current dose of statin and diet Rx.  No myalgias or side effects.  F/U  LFT's in 6 months        Meniere's  :  F/u [place] Seems to be improving on chronic diuretic   Urology:  On flomax/proscar  for hesitancy and frequent urination at night f/u with primary for PSA   Hypotension:  Not cardiac a bit better off flomax   Chest Pain:  Seems to have been muscular from painting not recurrent discussed f/u stress test if recurs   Restless Leg:  syndrome improved with Mirapex f/u primary   Chest Pain:  shared decision making cardiac CTA ordered   Dysphagia:  contact Dr Redmond School primary suspect he should have  EGD.  Will be able to visualized esophagus some on CT above    F/U in a year if CT heart ok   Signed, Jenkins Rouge, MD  11/13/2021 8:17 AM    [facility] [address], [place], [place]  [address] Phone: [phone]; Fax: [phone]

## 2021-11-18 ENCOUNTER — Other Ambulatory Visit: Payer: Self-pay

## 2021-11-18 ENCOUNTER — Encounter: Payer: Self-pay | Admitting: Cardiovascular Disease

## 2021-11-18 ENCOUNTER — Ambulatory Visit (INDEPENDENT_AMBULATORY_CARE_PROVIDER_SITE_OTHER): Payer: No Typology Code available for payment source | Admitting: Cardiovascular Disease

## 2021-11-18 VITALS — BP 100/72 | HR 69 | Ht 74.0 in | Wt 196.0 lb

## 2021-11-18 DIAGNOSIS — Z9889 Other specified postprocedural states: Secondary | ICD-10-CM

## 2021-11-18 DIAGNOSIS — E782 Mixed hyperlipidemia: Secondary | ICD-10-CM

## 2021-11-18 DIAGNOSIS — I951 Orthostatic hypotension: Secondary | ICD-10-CM

## 2021-11-18 DIAGNOSIS — R072 Precordial pain: Secondary | ICD-10-CM | POA: Diagnosis not present

## 2021-11-18 LAB — BASIC METABOLIC PANEL
BUN/Creatinine Ratio: 20 (ref 10–24)
BUN: 23 mg/dL (ref 8–27)
CO2: 26 mmol/L (ref 20–29)
Calcium: 9.6 mg/dL (ref 8.6–10.2)
Chloride: 102 mmol/L (ref 96–106)
Creatinine, Ser: 1.13 mg/dL (ref 0.76–1.27)
Glucose: 133 mg/dL — ABNORMAL HIGH (ref 70–99)
Potassium: 4.6 mmol/L (ref 3.5–5.2)
Sodium: 138 mmol/L (ref 134–144)
eGFR: 68 mL/min/{1.73_m2} (ref >59)

## 2021-11-18 MED ORDER — METOPROLOL TARTRATE 100 MG PO TABS: ORAL_TABLET | ORAL | 0 refills | Status: DC

## 2021-11-18 NOTE — Patient Instructions (Addendum)
Medication Instructions:  Your physician recommends that you continue on your current medications as directed. Please refer to the Current Medication list given to you today.  *If you need a refill on your cardiac medications before your next appointment, please call your pharmacy*  Lab Work: Your physician recommends that you have lab work today- BMET  If you have labs (blood work) drawn today and your tests are completely normal, you will receive your results only by: MyChart Message (if you have MyChart) OR A paper copy in the mail If you have any lab test that is abnormal or we need to change your treatment, we will call you to review the results.  Testing/Procedures: Your physician has requested that you have cardiac CT. Cardiac computed tomography (CT) is a painless test that uses an x-ray machine to take clear, detailed pictures of your heart. For further information please visit [email or web address]. Please follow instruction sheet as given.  Follow-Up: At [facility], you and your health needs are our priority.  As part of our continuing mission to provide you with exceptional heart care, we have created designated Provider Care Teams.  These Care Teams include your primary Cardiologist (physician) and Advanced Practice Providers (APPs -  Physician Assistants and Nurse Practitioners) who all work together to provide you with the care you need, when you need it.  We recommend signing up for the patient portal called "MyChart".  Sign up information is provided on this After Visit Summary.  MyChart is used to connect with patients for Virtual Visits (Telemedicine).  Patients are able to view lab/test results, encounter notes, upcoming appointments, etc.  Non-urgent messages can be sent to your provider as well.   To learn more about what you can do with MyChart, go to [email or web address].    Your next appointment:   12 month(s)  The format for your next appointment:   In  Person  Provider:   Jenkins Rouge, MD {  Other Instructions   Your cardiac CT will be scheduled at one of the below locations:   [facility] [address] [place], [place] [address] [phone]  If scheduled at [facility], please arrive at the [facility] and Children's Entrance (Entrance C2) of [facility] 30 minutes prior to test start time. You can use the FREE valet parking offered at entrance C (encouraged to control the heart rate for the test)  Proceed to the [facility] Radiology Department (first floor) to check-in and test prep.  All radiology patients and guests should use entrance C2 at [facility], accessed from [facility], even though the hospital's physical address listed is [address]     Please follow these instructions carefully (unless otherwise directed):  Hold all erectile dysfunction medications at least 3 days (72 hrs) prior to test.  On the Night Before the Test: Be sure to Drink plenty of water. Do not consume any caffeinated/decaffeinated beverages or chocolate 12 hours prior to your test. Do not take any antihistamines 12 hours prior to your test.  On the Day of the Test: Drink plenty of water until 1 hour prior to the test. Do not eat any food 4 hours prior to the test. You may take your regular medications prior to the test.  Take metoprolol (Lopressor) 100 mg two hours prior to test.  After the Test: Drink plenty of water. After receiving IV contrast, you may experience a mild flushed feeling. This is normal. On occasion, you  may experience a mild rash up to 24 hours after the test. This is not dangerous. If this occurs, you can take Benadryl 25 mg and increase your fluid intake. If you experience trouble breathing, this can be serious. If it is severe call [phone] IMMEDIATELY. If it is mild, please call our office.   We will call to schedule your test 2-4 weeks out understanding that  some insurance companies will need an authorization prior to the service being performed.   For non-scheduling related questions, please contact the cardiac imaging nurse navigator should you have any questions/concerns: Marchia Bond, Cardiac Imaging Nurse Navigator Gordy Clement, Cardiac Imaging Nurse Navigator [facility] Direct Office Dial: [phone]   For scheduling needs, including cancellations and rescheduling, please call [place], [phone].

## 2021-11-25 ENCOUNTER — Encounter: Payer: Self-pay | Admitting: Cardiovascular Disease

## 2021-11-28 ENCOUNTER — Telehealth (HOSPITAL_COMMUNITY): Payer: Self-pay | Admitting: *Deleted

## 2021-11-28 ENCOUNTER — Encounter (HOSPITAL_COMMUNITY): Payer: Self-pay

## 2021-11-28 NOTE — Telephone Encounter (Signed)
Reaching out to patient to offer assistance regarding upcoming cardiac imaging study; pt verbalizes understanding of appt date/time, parking situation and where to check in, pre-test NPO status, and verified current allergies; name and call back number provided for further questions should they arise ? ?Gordy Clement RN Navigator Cardiac Imaging ?[facility] ?[phone] office ?[phone] cell ? ?Patient's resting HR is in the high 50's and with patient's well controlled BP, advised not to take metoprolol tartrate for test. He is aware to arrive at 10:30am for his 11am test. ?

## 2021-11-28 NOTE — Telephone Encounter (Signed)
Attempted to call patient regarding upcoming cardiac CT appointment. °Left message on voicemail with name and callback number ° °Travor Royce RN Navigator Cardiac Imaging °[facility] °[phone] Office °[phone] Cell ° °

## 2021-12-01 ENCOUNTER — Ambulatory Visit (HOSPITAL_COMMUNITY)
Admission: RE | Admit: 2021-12-01 | Discharge: 2021-12-01 | Disposition: A | Discharge status: Home / Self Care | Payer: No Typology Code available for payment source | Source: Ambulatory Visit | Attending: Cardiovascular Disease | Admitting: Cardiovascular Disease

## 2021-12-01 ENCOUNTER — Other Ambulatory Visit: Payer: Self-pay

## 2021-12-01 ENCOUNTER — Encounter (HOSPITAL_COMMUNITY): Payer: Self-pay

## 2021-12-01 DIAGNOSIS — R072 Precordial pain: Secondary | ICD-10-CM | POA: Diagnosis not present

## 2021-12-01 MED ORDER — NITROGLYCERIN 0.4 MG SL SUBL
0.8000 mg | SUBLINGUAL_TABLET | Freq: Once | SUBLINGUAL | Status: AC
  Administered 2021-12-01: 0.8 mg via SUBLINGUAL

## 2021-12-01 MED ORDER — IOHEXOL 350 MG/ML SOLN
100.0000 mL | Freq: Once | INTRAVENOUS | Status: AC | PRN
  Administered 2021-12-01: 100 mL via INTRAVENOUS

## 2021-12-01 MED ORDER — NITROGLYCERIN 0.4 MG SL SUBL
SUBLINGUAL_TABLET | SUBLINGUAL | Status: AC
  Filled 2021-12-01: qty 2

## 2021-12-05 DIAGNOSIS — N401 Enlarged prostate with lower urinary tract symptoms: Secondary | ICD-10-CM | POA: Diagnosis not present

## 2021-12-05 DIAGNOSIS — R3912 Poor urinary stream: Secondary | ICD-10-CM | POA: Diagnosis not present

## 2021-12-05 DIAGNOSIS — R350 Frequency of micturition: Secondary | ICD-10-CM | POA: Diagnosis not present

## 2022-01-31 ENCOUNTER — Other Ambulatory Visit: Payer: Self-pay | Admitting: Family Medicine

## 2022-01-31 DIAGNOSIS — G2581 Restless legs syndrome: Secondary | ICD-10-CM

## 2022-02-02 ENCOUNTER — Encounter: Payer: Self-pay | Admitting: Family Medicine

## 2022-02-02 ENCOUNTER — Ambulatory Visit (INDEPENDENT_AMBULATORY_CARE_PROVIDER_SITE_OTHER): Payer: No Typology Code available for payment source | Admitting: Family Medicine

## 2022-02-02 VITALS — BP 100/60 | HR 56 | Temp 97.9°F | Ht 74.0 in | Wt 201.0 lb

## 2022-02-02 DIAGNOSIS — Z8616 Personal history of COVID-19: Secondary | ICD-10-CM | POA: Diagnosis not present

## 2022-02-02 DIAGNOSIS — E785 Hyperlipidemia, unspecified: Secondary | ICD-10-CM

## 2022-02-02 DIAGNOSIS — N401 Enlarged prostate with lower urinary tract symptoms: Secondary | ICD-10-CM

## 2022-02-02 DIAGNOSIS — Z9889 Other specified postprocedural states: Secondary | ICD-10-CM

## 2022-02-02 DIAGNOSIS — H8109 Meniere's disease, unspecified ear: Secondary | ICD-10-CM | POA: Diagnosis not present

## 2022-02-02 DIAGNOSIS — Z0000 Encounter for general adult medical examination without abnormal findings: Secondary | ICD-10-CM | POA: Diagnosis not present

## 2022-02-02 DIAGNOSIS — H01005 Unspecified blepharitis left lower eyelid: Secondary | ICD-10-CM

## 2022-02-02 DIAGNOSIS — J309 Allergic rhinitis, unspecified: Secondary | ICD-10-CM

## 2022-02-02 DIAGNOSIS — H01002 Unspecified blepharitis right lower eyelid: Secondary | ICD-10-CM | POA: Diagnosis not present

## 2022-02-02 DIAGNOSIS — H25013 Cortical age-related cataract, bilateral: Secondary | ICD-10-CM | POA: Insufficient documentation

## 2022-02-02 DIAGNOSIS — Z8249 Family history of ischemic heart disease and other diseases of the circulatory system: Secondary | ICD-10-CM | POA: Diagnosis not present

## 2022-02-02 DIAGNOSIS — M720 Palmar fascial fibromatosis [Dupuytren]: Secondary | ICD-10-CM

## 2022-02-02 DIAGNOSIS — Z86010 Personal history of colonic polyps: Secondary | ICD-10-CM | POA: Diagnosis not present

## 2022-02-02 NOTE — Progress Notes (Signed)
Andrew Ayala is a 76 y.o. male who presents for annual wellness visit ,CPE and follow-up on chronic medical conditions.  He has noticed some slight difficulty with dizziness that usually occurs when he goes from a sitting to a standing position but it goes away very quickly.  He does have underlying M?ni?re's disease and does have chlorthalidone but is not presently taking this.  He was given Uroxatrol which states that helps with his BPH symptoms.  He has had recent Dupuytren's contracture surgery and seems to be recovering nicely from that.  He also was recently seen ophthalmology and is being treated for blepharitis.  He also complains of intermittent right lower abdominal discomfort but no nausea, vomiting, diarrhea, constipation.  He does have cataracts and is being followed for this.  He also scheduled for colonoscopy later in the year due to polyps.  His allergies seem to be under good control.  He does see cardiology for follow-up on his mitral valve repair.  No chest pain, shortness of breath, PND or DOE.  He states that he did contract COVID in December of last year but is doing fine now. ? ? ?Immunizations and Health Maintenance ?Immunization History  ?Administered Date(s) Administered  ? DT (Pediatric) 01/15/1998, 05/27/2007  ? Influenza Split 07/14/2001, 09/25/2003, 06/18/2012, 07/05/2013, 06/19/2014  ? Influenza, High Dose Seasonal PF 06/12/2015, 05/12/2017, 05/27/2018, 05/28/2020  ? Influenza, Quadrivalent, Recombinant, Inj, Pf 06/20/2019  ? Influenza-Unspecified 06/06/2016, 06/09/2021  ? Moderna Sars-Covid-2 Vaccination 10/20/2019, 11/17/2019, 07/22/2020, 01/03/2021  ? PPD Test 09/01/2001  ? Pension scheme manager 4yr & up 07/02/2021  ? Pneumococcal Conjugate-13 08/25/2013  ? Pneumococcal Polysaccharide-23 05/17/2006  ? Tdap 05/27/2007, 12/02/2016  ? Zoster Recombinat (Shingrix) 06/25/2017, 10/25/2017  ? Zoster, Live 08/24/2008  ? ?Health Maintenance Due  ?Topic Date Due  ?  Pneumonia Vaccine 76 Years old (337 08/25/2014  ? ? ?Last colonoscopy: 07/09/17 Dr.Perry  ?Last PSA:11/20/16 ( 0.1) ?Dentist: Q six months  ?Ophtho: Q year ?Exercise: Six days a week  ? ?Other doctors caring for patient include: Dr. PHenrene PastorGI ?           Dr. DDorina Hoyerurology ?                      Dr. OApolonio Schneidersortho  ?            Dr. NJohnsie Cancelcardiology ?            Dr. BValetta Closeeye ?Advanced Directives: ?Does Patient Have a Medical Advance Directive?: Yes ?Type of Advance Directive: Living will, Healthcare Power of Attorney ?Does patient want to make changes to medical advance directive?: No - Patient declined ?[place] of [place]?: Yes - validated most recent copy scanned in chart (See row information) ? ?Depression screen:  See questionnaire below.   ?  ? ?  02/02/2022  ?  2:26 PM 01/30/2021  ?  3:08 PM 03/21/2020  ?  1:25 PM 01/24/2020  ? 11:01 AM 12/20/2018  ? 10:28 AM  ?Depression screen PHQ 2/9  ?Decreased Interest 0 0 0 0 0  ?Down, Depressed, Hopeless 0 0 0 0 0  ?PHQ - 2 Score 0 0 0 0 0  ? ? ?Fall Screen: See Questionaire below. ?  ? ?  02/02/2022  ?  2:25 PM 01/30/2021  ?  3:07 PM 03/21/2020  ?  1:25 PM 01/24/2020  ? 11:00 AM 12/20/2018  ? 10:28 AM  ?Fall Risk   ?Falls in  the past year? 0 0 0 0 0  ?Number falls in past yr: 0 0  0   ?Injury with Fall? 0 0  0   ?Risk for fall due to : No Fall Risks No Fall Risks     ?Follow up Falls evaluation completed Falls evaluation completed     ? ? ?ADL screen:  See questionnaire below.  ?Functional Status Survey: ?Is the patient deaf or have difficulty hearing?: No ?Does the patient have difficulty seeing, even when wearing glasses/contacts?: No ?Does the patient have difficulty concentrating, remembering, or making decisions?: No ?Does the patient have difficulty walking or climbing stairs?: No ?Does the patient have difficulty dressing or bathing?: No ?Does the patient have difficulty doing errands alone such as visiting a doctor's office or shopping?:  No ? ? ?Review of Systems ? ?Constitutional: -, -unexpected weight change, -anorexia, -fatigue ?Dermatology: denies changing moles, rash, lumps ?ENT: -runny nose, -ear pain, -sore throat,  ?Cardiology:  -chest pain, -palpitations, -orthopnea, ?Respiratory: -cough, -shortness of breath, -dyspnea on exertion, -wheezing,  ?Gastroenterology: -abdominal pain, -nausea, -vomiting, -diarrhea, -constipation, -dysphagia ?Hematology: -bleeding or bruising problems ?Musculoskeletal: -arthralgias, -myalgias, -joint swelling, -back pain, - ?Ophthalmology: -vision changes,  ?Urology: -dysuria, -difficulty urinating,  -urinary frequency, -urgency, incontinence ?Neurology: -, -numbness, , -memory loss, -falls, -dizziness ? ? ? ?PHYSICAL EXAM: ? ?General Appearance: Alert, cooperative, no distress, appears stated age ?Head: Normocephalic, without obvious abnormality, atraumatic ?Eyes: PERRL, conjunctiva/corneas clear, EOM's intact, fundi benign ?Ears: Normal TM's and external ear canals ?Nose: Nares normal, mucosa normal, no drainage or sinus   tenderness ?Throat: Lips, mucosa, and tongue normal; teeth and gums normal ?Neck: Supple, no lymphadenopathy, thyroid:no enlargement/tenderness/nodules; no carotid bruit or JVD ?Lungs: Clear to auscultation bilaterally without wheezes, rales or ronchi; respirations unlabored ?Heart: Regular rate and rhythm, S1 and S2 normal, no murmur, rub or gallop ?Abdomen: Soft, non-tender, nondistended, normoactive bowel sounds, no masses, no hepatosplenomegaly.  Genital exam shows no hernia, slight atrophy of the right testy. ?Extremities: No clubbing, cyanosis or edema healing  lesions noted on the hand. ?Pulses: 2+ and symmetric all extremities ?Skin: Skin color, texture, turgor normal, no rashes or lesions ?Lymph nodes: Cervical, supraclavicular, and axillary nodes normal ?Neurologic: CNII-XII intact, normal strength, sensation and gait; reflexes 2+ and symmetric throughout   ?Psych: Normal mood,  affect, hygiene and grooming ? ?ASSESSMENT/PLAN: ?Routine general medical examination at a health care facility - Plan: CBC with Differential/Platelet, Comprehensive metabolic panel, Lipid panel ? ?Cortical age-related cataract of both eyes ? ?Dupuytren's contracture of hand ? ?Benign prostatic hyperplasia with lower urinary tract symptoms, symptom details unspecified ? ?Hyperlipidemia LDL goal <100 ? ?Hx of colonic polyps ? ?Allergic rhinitis, mild ? ?S/P minimally invasive mitral valve repair - Plan: CBC with Differential/Platelet, Comprehensive metabolic panel, Lipid panel ? ?Family history of heart disease - Plan: Lipid panel ? ?Meniere's disease, unspecified laterality ? ?Blepharitis of lower eyelids of both eyes, unspecified type ? ?History of COVID-19 - December 2022 ? ?He will continue on his present medications.  I explained that the dizziness was probably postural related and strongly encouraged him to stay as hydrated as possible during the day and limit his consumption of fluids at night.  Recommend he go from sitting to standing position slowly.  We will follow-up with ophthalmology for his blepharitis.  Continue on Uroxatrol.  Continue to do physical therapy for his Dupuytren's. ? ?Discussed at least 30 minutes of aerobic activity at least 5 days/week; ; regular seatbelt use; changing batteries in smoke  detectors. Immunization recommendations discussed.  Colonoscopy recommendations reviewed. ? ? ?Medicare Attestation ?I have personally reviewed: ?The patient's medical and social history ?Their use of alcohol, tobacco or illicit drugs ?Their current medications and supplements ?The patient's functional ability including ADLs,fall risks, home safety risks, cognitive, and hearing and visual impairment ?Diet and physical activities ?Evidence for depression or mood disorders ? ?The patient's weight, height, and BMI have been recorded in the chart.  I have made referrals, counseling, and provided education  to the patient based on review of the above and I have provided the patient with a written personalized care plan for preventive services.   ? ? ?Jill Alexanders, MD   02/02/2022  ? ? ?  ?

## 2022-02-02 NOTE — Telephone Encounter (Signed)
[facility] is requesting to fill pt mirapex. Please advise [place] ?

## 2022-02-02 NOTE — Patient Instructions (Signed)
?  Andrew Ayala , ?Thank you for taking time to come for your Medicare Wellness Visit. I appreciate your ongoing commitment to your health goals. Please review the following plan we discussed and let me know if I can assist you in the future.  ? ?These are the goals we discussed: ?Be aware of the dizziness but moved from 1 position to another a little bit slower ?  ?This is a list of the screening recommended for you and due dates:  ?Health Maintenance  ?Topic Date Due  ? Pneumonia Vaccine (3) 02/10/2027*  ? Flu Shot  04/21/2022  ? Colon Cancer Screening  07/09/2022  ? Tetanus Vaccine  12/03/2026  ? COVID-19 Vaccine  Completed  ? Hepatitis C Screening: USPSTF Recommendation to screen - Ages 4-79 yo.  Completed  ? Zoster (Shingles) Vaccine  Completed  ? HPV Vaccine  Aged Out  ?*Topic was postponed. The date shown is not the original due date.  ?  ?

## 2022-02-03 LAB — COMPREHENSIVE METABOLIC PANEL
ALT: 18 IU/L (ref 0–44)
AST: 22 IU/L (ref 0–40)
Albumin/Globulin Ratio: 1.7 (ref 1.2–2.2)
Albumin: 4.1 g/dL (ref 3.7–4.7)
Alkaline Phosphatase: 78 IU/L (ref 44–121)
BUN/Creatinine Ratio: 13 (ref 10–24)
BUN: 14 mg/dL (ref 8–27)
Bilirubin Total: 0.6 mg/dL (ref 0.0–1.2)
CO2: 24 mmol/L (ref 20–29)
Calcium: 8.9 mg/dL (ref 8.6–10.2)
Chloride: 106 mmol/L (ref 96–106)
Creatinine, Ser: 1.06 mg/dL (ref 0.76–1.27)
Globulin, Total: 2.4 g/dL (ref 1.5–4.5)
Glucose: 91 mg/dL (ref 70–99)
Potassium: 4.3 mmol/L (ref 3.5–5.2)
Sodium: 143 mmol/L (ref 134–144)
Total Protein: 6.5 g/dL (ref 6.0–8.5)
eGFR: 74 mL/min/{1.73_m2} (ref >59)

## 2022-02-03 LAB — CBC WITH DIFFERENTIAL/PLATELET
Basophils Absolute: 0.1 10*3/uL (ref 0.0–0.2)
Basos: 2 %
EOS (ABSOLUTE): 0.3 10*3/uL (ref 0.0–0.4)
Eos: 8 %
Hematocrit: 43 % (ref 37.5–51.0)
Hemoglobin: 14.9 g/dL (ref 13.0–17.7)
Immature Grans (Abs): 0 10*3/uL (ref 0.0–0.1)
Immature Granulocytes: 0 %
Lymphocytes Absolute: 1.4 10*3/uL (ref 0.7–3.1)
Lymphs: 33 %
MCH: 33.6 pg — ABNORMAL HIGH (ref 26.6–33.0)
MCHC: 34.7 g/dL (ref 31.5–35.7)
MCV: 97 fL (ref 79–97)
Monocytes Absolute: 0.4 10*3/uL (ref 0.1–0.9)
Monocytes: 10 %
Neutrophils Absolute: 2 10*3/uL (ref 1.4–7.0)
Neutrophils: 47 %
Platelets: 145 10*3/uL — ABNORMAL LOW (ref 150–450)
RBC: 4.43 x10E6/uL (ref 4.14–5.80)
RDW: 12.2 % (ref 11.6–15.4)
WBC: 4.3 10*3/uL (ref 3.4–10.8)

## 2022-02-03 LAB — LIPID PANEL
Chol/HDL Ratio: 3.6 ratio (ref 0.0–5.0)
Cholesterol, Total: 154 mg/dL (ref 100–199)
HDL: 43 mg/dL (ref >39)
LDL Chol Calc (NIH): 96 mg/dL (ref 0–99)
Triglycerides: 75 mg/dL (ref 0–149)
VLDL Cholesterol Cal: 15 mg/dL (ref 5–40)

## 2022-02-03 MED ORDER — ATORVASTATIN CALCIUM 20 MG PO TABS: 20.0000 mg | ORAL_TABLET | Freq: Every day | ORAL | 3 refills | Status: DC

## 2022-02-03 NOTE — Addendum Note (Signed)
Addended by: Denita Lung on: 02/03/2022 08:25 AM ? ? Modules accepted: Orders ? ?

## 2022-03-04 ENCOUNTER — Other Ambulatory Visit: Payer: Self-pay | Admitting: Family Medicine

## 2022-03-04 DIAGNOSIS — N401 Enlarged prostate with lower urinary tract symptoms: Secondary | ICD-10-CM

## 2022-03-11 DIAGNOSIS — N401 Enlarged prostate with lower urinary tract symptoms: Secondary | ICD-10-CM | POA: Diagnosis not present

## 2022-03-11 DIAGNOSIS — R3912 Poor urinary stream: Secondary | ICD-10-CM | POA: Diagnosis not present

## 2022-03-11 DIAGNOSIS — R3915 Urgency of urination: Secondary | ICD-10-CM | POA: Diagnosis not present

## 2022-03-11 DIAGNOSIS — R350 Frequency of micturition: Secondary | ICD-10-CM | POA: Diagnosis not present

## 2022-04-07 ENCOUNTER — Other Ambulatory Visit: Payer: Self-pay | Admitting: Family Medicine

## 2022-04-07 NOTE — Telephone Encounter (Signed)
[facility] is [facility] to fill pt amoxicillin for dental work . Please advise Kh

## 2022-07-14 ENCOUNTER — Encounter: Payer: Self-pay | Admitting: Internal Medicine

## 2022-07-15 ENCOUNTER — Encounter: Payer: Self-pay | Admitting: Internal Medicine

## 2022-07-15 DIAGNOSIS — Z86010 Personal history of colonic polyps: Secondary | ICD-10-CM | POA: Diagnosis not present

## 2022-07-15 DIAGNOSIS — Z008 Encounter for other general examination: Secondary | ICD-10-CM | POA: Diagnosis not present

## 2022-07-15 DIAGNOSIS — N182 Chronic kidney disease, stage 2 (mild): Secondary | ICD-10-CM | POA: Diagnosis not present

## 2022-07-15 DIAGNOSIS — F17211 Nicotine dependence, cigarettes, in remission: Secondary | ICD-10-CM | POA: Diagnosis not present

## 2022-07-15 DIAGNOSIS — E663 Overweight: Secondary | ICD-10-CM | POA: Diagnosis not present

## 2022-07-15 DIAGNOSIS — N401 Enlarged prostate with lower urinary tract symptoms: Secondary | ICD-10-CM | POA: Diagnosis not present

## 2022-07-15 DIAGNOSIS — D696 Thrombocytopenia, unspecified: Secondary | ICD-10-CM | POA: Diagnosis not present

## 2022-07-15 DIAGNOSIS — E785 Hyperlipidemia, unspecified: Secondary | ICD-10-CM | POA: Diagnosis not present

## 2022-07-15 DIAGNOSIS — Z6826 Body mass index (BMI) 26.0-26.9, adult: Secondary | ICD-10-CM | POA: Diagnosis not present

## 2022-07-30 ENCOUNTER — Ambulatory Visit (AMBULATORY_SURGERY_CENTER): Payer: Self-pay

## 2022-07-30 ENCOUNTER — Encounter: Payer: Self-pay | Admitting: Internal Medicine

## 2022-07-30 VITALS — Ht 74.0 in | Wt 201.0 lb

## 2022-07-30 DIAGNOSIS — Z800 Family history of malignant neoplasm of digestive organs: Secondary | ICD-10-CM

## 2022-07-30 DIAGNOSIS — Z86010 Personal history of colonic polyps: Secondary | ICD-10-CM

## 2022-07-30 MED ORDER — NA SULFATE-K SULFATE-MG SULF 17.5-3.13-1.6 GM/177ML PO SOLN: 1.0000 | Freq: Once | ORAL | 0 refills | Status: AC

## 2022-07-30 NOTE — Progress Notes (Signed)

## 2022-08-24 ENCOUNTER — Encounter: Payer: No Typology Code available for payment source | Admitting: Internal Medicine

## 2022-09-01 ENCOUNTER — Ambulatory Visit (AMBULATORY_SURGERY_CENTER): Payer: Self-pay | Admitting: *Deleted

## 2022-09-01 VITALS — Ht 74.0 in | Wt 205.0 lb

## 2022-09-01 DIAGNOSIS — Z800 Family history of malignant neoplasm of digestive organs: Secondary | ICD-10-CM

## 2022-09-01 DIAGNOSIS — Z86010 Personal history of colonic polyps: Secondary | ICD-10-CM

## 2022-09-01 NOTE — Progress Notes (Signed)
No egg or soy allergy known to patient  No issues known to pt with past sedation with any surgeries or procedures Patient denies ever being told they had issues or difficulty with intubation  No FH of Malignant Hyperthermia Pt is not on diet pills Pt is not on  home 02  Pt is not on blood thinners  Pt denies issues with constipation  No A fib or A flutter Have any cardiac testing pending--NO Pt instructed to use Singlecare.com or GoodRx for a price reduction on prep   Pt. Already has suprep at home from previous appointment.   Patient's chart reviewed by Osvaldo Angst CNRA prior to previsit and patient appropriate for the [facility].  Previsit completed and red dot placed by patient's name on their procedure day (on provider's schedule).

## 2022-09-30 ENCOUNTER — Ambulatory Visit (AMBULATORY_SURGERY_CENTER): Payer: No Typology Code available for payment source | Admitting: Internal Medicine

## 2022-09-30 ENCOUNTER — Encounter: Payer: Self-pay | Admitting: Internal Medicine

## 2022-09-30 VITALS — BP 106/71 | HR 57 | Temp 96.8°F | Resp 11 | Ht 74.0 in | Wt 205.0 lb

## 2022-09-30 DIAGNOSIS — K5730 Diverticulosis of large intestine without perforation or abscess without bleeding: Secondary | ICD-10-CM

## 2022-09-30 DIAGNOSIS — Z1211 Encounter for screening for malignant neoplasm of colon: Secondary | ICD-10-CM | POA: Diagnosis not present

## 2022-09-30 DIAGNOSIS — Z09 Encounter for follow-up examination after completed treatment for conditions other than malignant neoplasm: Secondary | ICD-10-CM

## 2022-09-30 DIAGNOSIS — Z86010 Personal history of colonic polyps: Secondary | ICD-10-CM | POA: Diagnosis not present

## 2022-09-30 DIAGNOSIS — D124 Benign neoplasm of descending colon: Secondary | ICD-10-CM

## 2022-09-30 MED ORDER — SODIUM CHLORIDE 0.9 % IV SOLN: 500.0000 mL | Freq: Once | INTRAVENOUS | Status: DC

## 2022-09-30 NOTE — Patient Instructions (Signed)
Please read handouts provided. Continue present medications. Await pathology results.   YOU HAD AN ENDOSCOPIC PROCEDURE TODAY AT THE [facility]:   Refer to the procedure report that was given to you for any specific questions about what was found during the examination.  If the procedure report does not answer your questions, please call your gastroenterologist to clarify.  If you requested that your care partner not be given the details of your procedure findings, then the procedure report has been included in a sealed envelope for you to review at your convenience later.  YOU SHOULD EXPECT: Some feelings of bloating in the abdomen. Passage of more gas than usual.  Walking can help get rid of the air that was put into your GI tract during the procedure and reduce the bloating. If you had a lower endoscopy (such as a colonoscopy or flexible sigmoidoscopy) you may notice spotting of blood in your stool or on the toilet paper. If you underwent a bowel prep for your procedure, you may not have a normal bowel movement for a few days.  Please Note:  You might notice some irritation and congestion in your nose or some drainage.  This is from the oxygen used during your procedure.  There is no need for concern and it should clear up in a day or so.  SYMPTOMS TO REPORT IMMEDIATELY:  Following lower endoscopy (colonoscopy or flexible sigmoidoscopy):  Excessive amounts of blood in the stool  Significant tenderness or worsening of abdominal pains  Swelling of the abdomen that is new, acute  Fever of 100F or higher  For urgent or emergent issues, a gastroenterologist can be reached at any hour by calling [phone]. Do not use MyChart messaging for urgent concerns.    DIET:  We do recommend a small meal at first, but then you may proceed to your regular diet.  Drink plenty of fluids but you should avoid alcoholic beverages for 24 hours.  ACTIVITY:  You should plan to take it easy for  the rest of today and you should NOT DRIVE or use heavy machinery until tomorrow (because of the sedation medicines used during the test).    FOLLOW UP: Our staff will call the number listed on your records the next business day following your procedure.  We will call around 7:15- 8:00 am to check on you and address any questions or concerns that you may have regarding the information given to you following your procedure. If we do not reach you, we will leave a message.     If any biopsies were taken you will be contacted by phone or by letter within the next 1-3 weeks.  Please call us at [phone] if you have not heard about the biopsies in 3 weeks.    SIGNATURES/CONFIDENTIALITY: You and/or your care partner have signed paperwork which will be entered into your electronic medical record.  These signatures attest to the fact that that the information above on your After Visit Summary has been reviewed and is understood.  Full responsibility of the confidentiality of this discharge information lies with you and/or your care-partner. 

## 2022-09-30 NOTE — Op Note (Signed)
[facility] Patient Name: Andrew Ayala Procedure Date: 09/30/2022 9:54 AM MRN: [id number] Endoscopist: Docia Chuck. Henrene Pastor , MD, [id number] Age: 77 Referring MD:  Date of Birth: 1946/09/14 Gender: Male Account #: [id number] Procedure:                Colonoscopy with cold snare polypectomy x 1 Indications:              High risk colon cancer surveillance: Personal                            history of multiple (3 or more) adenomas. Previous                            examinations 2007, 2013, 2018 Medicines:                Monitored Anesthesia Care Procedure:                Pre-Anesthesia Assessment:                           - Prior to the procedure, a History and Physical                            was performed, and patient medications and                            allergies were reviewed. The patient's tolerance of                            previous anesthesia was also reviewed. The risks                            and benefits of the procedure and the sedation                            options and risks were discussed with the patient.                            All questions were answered, and informed consent                            was obtained. Prior Anticoagulants: The patient has                            taken no anticoagulant or antiplatelet agents.                            After reviewing the risks and benefits, the patient                            was deemed in satisfactory condition to undergo the                            procedure.  After obtaining informed consent, the colonoscope                            was passed under direct vision. Throughout the                            procedure, the patient's blood pressure, pulse, and                            oxygen saturations were monitored continuously. The                            Olympus CF-HQ190L (Serial# [id number]) Colonoscope was                            introduced  through the anus and advanced to the the                            hepatic flexure. The proximal extent of the exam                            and rectum were photographed. The quality of the                            bowel preparation was excellent. The colonoscopy                            was performed with difficulty (redundant colon with                            looping averse to multiple body positions and                            maneuvers). The patient tolerated the procedure                            well. The bowel preparation used was SUPREP via                            split dose instruction. Scope In: 10:04:13 AM Scope Out: 10:41:53 AM Total Procedure Duration: 0 hours 37 minutes 40 seconds  Findings:                 A 7 mm polyp was found in the descending colon. The                            polyp was removed with a cold snare. Resection and                            retrieval were complete.                           A few diverticula were found in the left colon.  The exam was quite difficult due to redundant colon                            and looping. Despite multiple body positions,                            counterpressure, and reinserting the colonoscope                            for a second attempt, the exam could only be                            carried out to the level of the hepatic flexure.                            The exam was otherwise without abnormality on                            direct and retroflexion views. Complications:            No immediate complications. Estimated blood loss:                            None. Estimated Blood Loss:     Estimated blood loss: none. Impression:               - One 7 mm polyp in the descending colon, removed                            with a cold snare. Resected and retrieved.                           - Diverticulosis in the left colon.                           - The  examination was otherwise normal on direct                            and retroflexion views. Recommendation:           - Patient has a contact number available for                            emergencies. The signs and symptoms of potential                            delayed complications were discussed with the                            patient. Return to normal activities tomorrow.                            Written discharge instructions were provided to the  patient.                           - Resume previous diet.                           - Continue present medications.                           - For incomplete exam recommend SCHEDULE VIRTUAL                            COLONOSCOPY "incomplete colonoscopy, history of                            polyps, rule out neoplasia". Given that examination                            is negative, no additional surveillance recommended                           - Await pathology results. Docia Chuck. Henrene Pastor, MD 09/30/2022 10:54:44 AM This report has been signed electronically.

## 2022-09-30 NOTE — Progress Notes (Signed)
Report to PACU, RN, vss, BBS= Clear.  

## 2022-09-30 NOTE — Progress Notes (Signed)
Approx 1034, pt started gupping.  Abd pressure had been used several different times leading up.  Suction started (minimal drool-like fluid).  Sedation halted.  Zofran and Robinul given.  Dr Henrene Pastor started to withdrawal (cecum wasn't reached).  Sats never dropped  BS clear

## 2022-09-30 NOTE — Progress Notes (Signed)
Called to room to assist during endoscopic procedure.  Patient ID and intended procedure confirmed with present staff. Received instructions for my participation in the procedure from the performing physician.  

## 2022-09-30 NOTE — Progress Notes (Signed)
Pt's states no medical or surgical changes since previsit or office visit. 

## 2022-09-30 NOTE — Progress Notes (Signed)
HISTORY OF PRESENT ILLNESS:  Andrew Ayala is a 77 y.o. male with a history of multiple adenomatous colon polyps.  Presents today for surveillance colonoscopy.  No complaints  REVIEW OF SYSTEMS:  All non-GI ROS negative.  Past Medical History:  Diagnosis Date   Acne rosacea    Allergic rhinitis    Allergy    Arthritis    FINGER,RIGHT HAND SMALL FINGER   Asthma    AS A CHILD   BPH (benign prostatic hyperplasia)    Broken leg 1951   right leg   Cataract    BEGINNING STAGES,BILATERAL   Chalazion, bilateral    lower lids   Dupuytren's contracture    BILATERAL HANDS   GERD (gastroesophageal reflux disease)    no meds/some reflux/takes tums   Heart murmur    Hyperlipidemia    MVP (mitral valve prolapse)    repaired   S/P minimally invasive mitral valve repair 02/27/2015   Complex valvuloplasty including triangular resection of flail segment of posterior leaflet, artificial Gore-tex neochord placement x8 and 34 mm Sorin Memo 3D Rechord ring annuloplasty   Sensory - neural hearing loss    Severe mitral regurgitation 10/05/2014   Vasomotor rhinitis     Past Surgical History:  Procedure Laterality Date   BROKEN LEG Right    AGE 77   CARDIAC CATHETERIZATION N/A 02/08/2015   Procedure: Left Heart Cath And Coronary Angiography;  Surgeon: Andrew Hector, MD;  Location: [facility];  Service: Cardiovascular;  Laterality: N/A;   COLONOSCOPY  2007   Dr. Henrene Ayala   HAND SURGERY  2001   right   MITRAL VALVE REPAIR Right 02/27/2015   Procedure: MINIMALLY INVASIVE MITRAL VALVE REPAIR (MVR);  Surgeon: Andrew Alberts, MD;  Location: [facility];  Service: Open Heart Surgery;  Laterality: Right;   NASAL SEPTUM SURGERY  1988   POLYPECTOMY     SEPTOPLASTY  1998   TEE WITHOUT CARDIOVERSION N/A 10/03/2014   Procedure: TRANSESOPHAGEAL ECHOCARDIOGRAM (TEE);  Surgeon: Andrew Hector, MD;  Location: [facility];  Service: Cardiovascular;  Laterality: N/A;   TEE WITHOUT CARDIOVERSION N/A  02/27/2015   Procedure: TRANSESOPHAGEAL ECHOCARDIOGRAM (TEE);  Surgeon: Andrew Alberts, MD;  Location: [facility];  Service: Open Heart Surgery;  Laterality: N/A;   undescended testicle  1951   wart removal  1997   cryo surgery (penis)   [facility]    Social History Andrew Ayala  reports that he has quit smoking. He has been exposed to tobacco smoke. He has never used smokeless tobacco. He reports current alcohol use. He reports that he does not use drugs.  family history includes Colon cancer (age of onset: 53) in his paternal uncle; Colon cancer (age of onset: 88) in his paternal uncle; Heart attack in his father; Heart failure in his mother; Pancreatic cancer in his paternal grandfather and paternal grandmother; Stroke in his sister.  No Known Allergies     PHYSICAL EXAMINATION: Vital signs: BP (!) 103/56   Pulse (!) 53   Temp (!) 96.8 F (36 C) (Temporal)   Ht '6\' 2"'$  (1.88 m)   Wt 205 lb (93 kg)   SpO2 98%   BMI 26.32 kg/m  General: Well-developed, well-nourished, no acute distress HEENT: Sclerae are anicteric, conjunctiva pink. Oral mucosa intact Lungs: Clear Heart: Regular Abdomen: soft, nontender, nondistended, no obvious ascites, no peritoneal signs, normal bowel sounds. No organomegaly. Extremities: No edema Psychiatric: alert and oriented x3. Cooperative  ASSESSMENT:  Personal history of multiple adenomatous polyps   PLAN:    Surveillance colonoscopy

## 2022-10-01 ENCOUNTER — Telehealth: Payer: Self-pay

## 2022-10-01 DIAGNOSIS — Z86010 Personal history of colonic polyps: Secondary | ICD-10-CM

## 2022-10-01 NOTE — Telephone Encounter (Signed)
Per 09/30/22 procedure report - For incomplete exam recommend SCHEDULE VIRTUAL COLONOSCOPY for "incomplete colonoscopy, hx of polyps, rule out neoplasia".  Diagnostic virtual colonoscopy order in epic. [facility] will contact patient directly to schedule appointment.

## 2022-10-01 NOTE — Telephone Encounter (Signed)
  Follow up Call-     09/30/2022    9:26 AM  Call back number  Post procedure Call Back phone  # [phone]  Permission to leave phone message Yes     Patient questions:  Do you have a fever, pain , or abdominal swelling? No. Pain Score  0 *  Have you tolerated food without any problems? Yes.    Have you been able to return to your normal activities? Yes.    Do you have any questions about your discharge instructions: Diet   No. Medications  No. Follow up visit  No.  Do you have questions or concerns about your Care? No.  Actions: * If pain score is 4 or above: No action needed, pain <4.   Had a small amount of bleeding yesterday after the procedure. The bleeding has resolved.

## 2022-10-02 ENCOUNTER — Encounter: Payer: Self-pay | Admitting: Internal Medicine

## 2022-10-06 NOTE — Telephone Encounter (Signed)
Called and spoke with Magda Paganini at [facility]. She confirmed that order was received. Magda Paganini informed me that they reached out to patient on 10/01/22 to schedule and patient told them that he needs to speak with his provider before he would schedule.

## 2022-11-11 DIAGNOSIS — L218 Other seborrheic dermatitis: Secondary | ICD-10-CM | POA: Diagnosis not present

## 2022-11-11 DIAGNOSIS — L280 Lichen simplex chronicus: Secondary | ICD-10-CM | POA: Diagnosis not present

## 2022-11-11 DIAGNOSIS — L814 Other melanin hyperpigmentation: Secondary | ICD-10-CM | POA: Diagnosis not present

## 2022-11-11 DIAGNOSIS — L821 Other seborrheic keratosis: Secondary | ICD-10-CM | POA: Diagnosis not present

## 2022-11-11 DIAGNOSIS — D225 Melanocytic nevi of trunk: Secondary | ICD-10-CM | POA: Diagnosis not present

## 2022-11-28 ENCOUNTER — Encounter: Payer: Self-pay | Admitting: Internal Medicine

## 2022-12-16 ENCOUNTER — Other Ambulatory Visit: Payer: Self-pay | Admitting: Family Medicine

## 2022-12-16 DIAGNOSIS — N401 Enlarged prostate with lower urinary tract symptoms: Secondary | ICD-10-CM

## 2022-12-29 ENCOUNTER — Other Ambulatory Visit: Payer: Self-pay | Admitting: Family Medicine

## 2022-12-29 NOTE — Telephone Encounter (Signed)
Left message for pt to call back.   Why does he need this?

## 2022-12-30 ENCOUNTER — Telehealth: Payer: Self-pay | Admitting: Family Medicine

## 2022-12-30 MED ORDER — AMOXICILLIN 500 MG PO CAPS: ORAL_CAPSULE | ORAL | 0 refills | Status: DC

## 2022-12-30 NOTE — Telephone Encounter (Signed)
Pt has to have this medication prior to having a dental cleaning. This is done every 6 months, due to heart procedure he had several years ago.

## 2023-01-01 ENCOUNTER — Ambulatory Visit
Admission: RE | Admit: 2023-01-01 | Discharge: 2023-01-01 | Disposition: A | Discharge status: Home / Self Care | Payer: No Typology Code available for payment source | Source: Ambulatory Visit | Attending: Internal Medicine | Admitting: Internal Medicine

## 2023-01-01 DIAGNOSIS — Z86010 Personal history of colonic polyps: Secondary | ICD-10-CM

## 2023-01-15 ENCOUNTER — Other Ambulatory Visit: Payer: Self-pay | Admitting: Family Medicine

## 2023-01-15 DIAGNOSIS — G2581 Restless legs syndrome: Secondary | ICD-10-CM

## 2023-01-19 ENCOUNTER — Telehealth: Payer: Self-pay | Admitting: Family Medicine

## 2023-01-19 ENCOUNTER — Other Ambulatory Visit: Payer: Self-pay | Admitting: Family Medicine

## 2023-01-19 DIAGNOSIS — E785 Hyperlipidemia, unspecified: Secondary | ICD-10-CM

## 2023-01-19 MED ORDER — ATORVASTATIN CALCIUM 20 MG PO TABS
20.0000 mg | ORAL_TABLET | Freq: Every day | ORAL | 0 refills | Status: DC
Start: 2023-01-19 — End: 2023-02-03

## 2023-01-19 NOTE — Telephone Encounter (Signed)
Pharmacy sent refill request for liptior please send to the  [id number] - [place], [place] - [address]

## 2023-02-02 ENCOUNTER — Ambulatory Visit (INDEPENDENT_AMBULATORY_CARE_PROVIDER_SITE_OTHER): Payer: No Typology Code available for payment source | Admitting: Family Medicine

## 2023-02-02 ENCOUNTER — Encounter: Payer: Self-pay | Admitting: Family Medicine

## 2023-02-02 VITALS — BP 100/60 | HR 56 | Ht 74.0 in | Wt 203.4 lb

## 2023-02-02 DIAGNOSIS — E785 Hyperlipidemia, unspecified: Secondary | ICD-10-CM

## 2023-02-02 DIAGNOSIS — G2581 Restless legs syndrome: Secondary | ICD-10-CM | POA: Diagnosis not present

## 2023-02-02 DIAGNOSIS — Z8249 Family history of ischemic heart disease and other diseases of the circulatory system: Secondary | ICD-10-CM

## 2023-02-02 DIAGNOSIS — N401 Enlarged prostate with lower urinary tract symptoms: Secondary | ICD-10-CM | POA: Diagnosis not present

## 2023-02-02 DIAGNOSIS — Z0000 Encounter for general adult medical examination without abnormal findings: Secondary | ICD-10-CM | POA: Diagnosis not present

## 2023-02-02 DIAGNOSIS — Z9889 Other specified postprocedural states: Secondary | ICD-10-CM | POA: Diagnosis not present

## 2023-02-02 DIAGNOSIS — Z86010 Personal history of colonic polyps: Secondary | ICD-10-CM

## 2023-02-02 DIAGNOSIS — H8109 Meniere's disease, unspecified ear: Secondary | ICD-10-CM

## 2023-02-02 DIAGNOSIS — H25013 Cortical age-related cataract, bilateral: Secondary | ICD-10-CM | POA: Diagnosis not present

## 2023-02-02 DIAGNOSIS — J309 Allergic rhinitis, unspecified: Secondary | ICD-10-CM

## 2023-02-02 DIAGNOSIS — N529 Male erectile dysfunction, unspecified: Secondary | ICD-10-CM

## 2023-02-02 LAB — POCT URINALYSIS DIP (PROADVANTAGE DEVICE)
Bilirubin, UA: NEGATIVE
Blood, UA: NEGATIVE
Glucose, UA: NEGATIVE mg/dL
Ketones, POC UA: NEGATIVE mg/dL
Leukocytes, UA: NEGATIVE
Nitrite, UA: NEGATIVE
Protein Ur, POC: NEGATIVE mg/dL
Specific Gravity, Urine: 1.005
Urobilinogen, Ur: 0.2
pH, UA: 6 (ref 5.0–8.0)

## 2023-02-02 MED ORDER — FINASTERIDE 5 MG PO TABS: 5.0000 mg | ORAL_TABLET | Freq: Every day | ORAL | 3 refills | Status: DC

## 2023-02-02 MED ORDER — PRAMIPEXOLE DIHYDROCHLORIDE 0.125 MG PO TABS
ORAL_TABLET | ORAL | 3 refills | Status: DC
Start: 2023-02-02 — End: 2024-02-22

## 2023-02-02 NOTE — Progress Notes (Signed)
Annual Wellness Visit     Patient: Andrew Ayala, Male    DOB: [birth year], 77 y.o.   MRN: [id number]  Subjective  Chief Complaint  Patient presents with   Medicare Wellness    Fasting(blood in lab) AWV/CPE. Has two skin tags he would like to know if he should have taken off. Derm said they could do it but it would cost $150, not sure if it would be more or less here and if we could do it today. Had colonoscopy in Dec and could not complete, had to schedule CT at [facility]. Previous one were ok, he wonders if there was a shift in his colon? He has had some constipation since the procedure and has some gas pressure.      Andrew Ayala is a 77 y.o. male who presents today for his Annual Wellness Visit and CPE. He reports consuming a general diet.  He generally feels well. He reports sleeping fairly well. He does have underlying allergies and these seem to be under good control.  He also has a history of Mnire's disease but has not had any difficulty with that recently.  He is taking care of his BPH with alfuzosin is well as finasteride.  He also has RLS and Mirapex is doing a very good job of keeping this under control.  There is a family history of heart disease with father having heart attack at age 103.  He also has aortic atherosclerosis and has been on atorvastatin 20 mg for over a year.  He has had a mitral valve repair and is followed by cardiology regularly.  He has also had a recent colonoscopy and in follow-up scanning.  Did show evidence of adenomatous colonic polyp.  He follows up regularly with his ophthalmologist concerning his cataract but at this point is not in need of any surgery.  He does have a history of erectile dysfunction but states that at this time it is not a major issue.      Medications: Outpatient Medications Prior to Visit  Medication Sig Note   alfuzosin (UROXATRAL) 10 MG 24 hr tablet Take 10 mg by mouth daily.    aspirin EC 81 MG tablet Take 81 mg by  mouth daily before supper.    atorvastatin (LIPITOR) 20 MG tablet Take 1 tablet (20 mg total) by mouth daily.    Eyelid Cleansers (OCUSOFT LID SCRUB EX) Apply topically 2 (two) times daily.  02/02/2023: As needed   GLUCOSAMINE-CHONDROITIN PO Take 1 tablet by mouth daily. Glucosamine 1500 mg/ chrondroiton 1200 mg    ketoconazole (NIZORAL) 2 % shampoo Apply 1 application topically 2 (two) times a week.    Multiple Vitamins-Minerals (MULTIVITAMIN WITH MINERALS) tablet Take 1 tablet by mouth daily.    Omega-3 Fatty Acids (FISH OIL) 1200 MG CAPS Take 1 capsule by mouth at bedtime.    [DISCONTINUED] finasteride (PROSCAR) 5 MG tablet TAKE 1 TABLET BY MOUTH EVERY DAY    [DISCONTINUED] pramipexole (MIRAPEX) 0.125 MG tablet TAKE 1 TABLET BY MOUTH EVERYDAY AT BEDTIME    amoxicillin (AMOXIL) 500 MG capsule Take prior to dental procedure (Patient not taking: Reported on 02/02/2023)    [DISCONTINUED] fexofenadine (ALLEGRA) 180 MG tablet Take 180 mg by mouth daily. (Patient not taking: Reported on 07/30/2022)    [DISCONTINUED] potassium chloride (KLOR-CON) 10 MEQ tablet TAKE 1 TABLET (10 MEQ TOTAL) BY MOUTH EVERY OTHER DAY. (Patient not taking: Reported on 07/30/2022)    No facility-administered medications prior to visit.  No Known Allergies  Patient Care Team: [name], MD as PCP - General Wendall Stade, MD as PCP - Cardiology (Cardiology) Optho: Dr. Cathey Endow Dentist: Dr. Alvester Morin GI: Dr. Landry Corporal: [facility] Urologist: Dr. Retta Diones Ortho: [facility], Dr. Melvyn Novas  Review of Systems  All other systems reviewed and are negative.       Objective    Physical Exam Alert and in no distress. Tympanic membranes and canals are normal. Pharyngeal area is normal. Neck is supple without adenopathy or thyromegaly. Cardiac exam shows a regular sinus rhythm without murmurs or gallops. Lungs are clear to auscultation. Abdominal exam shows no masses or tenderness.   Most recent functional  status assessment:    02/02/2023    2:33 PM  In your present state of health, do you have any difficulty performing the following activities:  Hearing? 1  Comment ringing in R ear, Meniere's  Vision? 1  Comment B/L cataracts, beginning stages  Difficulty concentrating or making decisions? 0  Walking or climbing stairs? 0  Dressing or bathing? 0  Doing errands, shopping? 0  Preparing Food and eating ? N  Using the Toilet? N  In the past six months, have you accidently leaked urine? Y  Comment at night  Do you have problems with loss of bowel control? N  Managing your Medications? N  Managing your Finances? N  Housekeeping or managing your Housekeeping? N   Most recent fall risk assessment:    02/02/2023    2:04 PM  Fall Risk   Falls in the past year? 0  Number falls in past yr: 0  Injury with Fall? 0  Risk for fall due to : No Fall Risks  Follow up Falls evaluation completed    Most recent depression screenings:    02/02/2023    2:04 PM 02/02/2022    2:26 PM  PHQ 2/9 Scores  PHQ - 2 Score 0 0   Most recent cognitive screening:    02/02/2022    2:27 PM  6CIT Screen  What Year? 0 points  What month? 0 points  What time? 0 points  Count back from 20 0 points  Months in reverse 0 points  Repeat phrase 0 points  Total Score 0 points   Most recent Audit-C alcohol use screening     No data to display         A score of 3 or more in women, and 4 or more in men indicates increased risk for alcohol abuse, EXCEPT if all of the points are from question 1   Vision/Hearing Screen: No results found.    Results for orders placed or performed in visit on 02/02/23  POCT Urinalysis DIP (Proadvantage Device)  Result Value Ref Range   Color, UA yellow yellow   Clarity, UA clear clear   Glucose, UA negative negative mg/dL   Bilirubin, UA negative negative   Ketones, POC UA negative negative mg/dL   Specific Gravity, Urine 1.005    Blood, UA negative negative   pH, UA  6.0 5.0 - 8.0   Protein Ur, POC negative negative mg/dL   Urobilinogen, Ur 0.2    Nitrite, UA Negative Negative   Leukocytes, UA Negative Negative      Assessment & Plan  Annual physical exam - Plan: POCT Urinalysis DIP (Proadvantage Device), CBC with Differential/Platelet, Comprehensive metabolic panel, Lipid panel  Allergic rhinitis, mild  Benign prostatic hyperplasia with lower urinary tract symptoms, symptom details unspecified - Plan: finasteride (  PROSCAR) 5 MG tablet  S/P minimally invasive mitral valve repair  Hyperlipidemia LDL goal <100 - Plan: Lipid panel  Hx of colonic polyps  Family history of heart disease - Plan: CBC with Differential/Platelet, Comprehensive metabolic panel, Lipid panel  Cortical age-related cataract of both eyes  Erectile dysfunction, unspecified erectile dysfunction type  RLS (restless legs syndrome) - Plan: pramipexole (MIRAPEX) 0.125 MG tablet  Meniere's disease, unspecified laterality He will continue on his present medication regimen.  Follow-up with cardiology on a regular basis.  He will also see urology on an as-needed basis. Annual wellness visit done today including the all of the following: Reviewed patient's Family Medical History Reviewed and updated list of patient's medical providers Assessment of cognitive impairment was done Assessed patient's functional ability Established a written schedule for health screening services Health Risk Assessent Completed and Reviewed  Exercise Activities and Dietary recommendations  Goals   None     Immunization History  Administered Date(s) Administered   DT (Pediatric) 01/15/1998, 05/27/2007   Influenza Split 07/14/2001, 09/25/2003, 06/18/2012, 07/05/2013, 06/19/2014   Influenza, High Dose Seasonal PF 06/12/2015, 05/12/2017, 05/27/2018, 05/28/2020, 05/08/2022   Influenza, Quadrivalent, Recombinant, Inj, Pf 06/20/2019   Influenza-Unspecified 06/06/2016, 06/09/2021   Moderna  Sars-Covid-2 Vaccination 10/20/2019, 11/17/2019, 07/22/2020, 01/03/2021   PFIZER Comirnaty(Gray Top)Covid-19 Tri-Sucrose Vaccine 06/06/2022   PPD Test 09/01/2001   Pfizer Covid-19 Vaccine Bivalent Booster 72yrs & up 07/02/2021   Pneumococcal Conjugate-13 08/25/2013   Pneumococcal Polysaccharide-23 05/17/2006   Respiratory Syncytial Virus Vaccine,Recomb Aduvanted(Arexvy) 10/09/2022   Tdap 05/27/2007, 12/02/2016   Zoster Recombinat (Shingrix) 06/25/2017, 10/25/2017   Zoster, Live 08/24/2008    Health Maintenance  Topic Date Due   COVID-19 Vaccine (7 - 2023-24 season) 08/01/2022   Pneumonia Vaccine 72+ Years old (3 of 3 - PPSV23 or PCV20) 02/10/2027 (Originally 08/25/2018)   INFLUENZA VACCINE  04/22/2023   Medicare Annual Wellness (AWV)  02/02/2024   DTaP/Tdap/Td (5 - Td or Tdap) 12/03/2026   COLONOSCOPY (Pts 45-21yrs Insurance coverage will need to be confirmed)  10/01/2027   Hepatitis C Screening  Completed   Zoster Vaccines- Shingrix  Completed   HPV VACCINES  Aged Out     Discussed health benefits of physical activity, and encouraged him to engage in regular exercise appropriate for his age and condition.    Problem List Items Addressed This Visit     Hyperlipidemia LDL goal <100 (Chronic)   Relevant Orders   Lipid panel   Allergic rhinitis, mild   BPH (benign prostatic hyperplasia)   Relevant Medications   finasteride (PROSCAR) 5 MG tablet   Cortical age-related cataract of both eyes   ED (erectile dysfunction)   Family history of heart disease   Relevant Orders   CBC with Differential/Platelet   Comprehensive metabolic panel   Lipid panel   Hx of colonic polyps   Meniere's disease   S/P minimally invasive mitral valve repair   Other Visit Diagnoses     Annual physical exam    -  Primary   Relevant Orders   POCT Urinalysis DIP (Proadvantage Device) (Completed)   CBC with Differential/Platelet   Comprehensive metabolic panel   Lipid panel   RLS (restless legs  syndrome)       Relevant Medications   pramipexole (MIRAPEX) 0.125 MG tablet       Follow-up in 1 year    Sharlot Gowda, MD

## 2023-02-03 LAB — LIPID PANEL
Chol/HDL Ratio: 3.4 ratio (ref 0.0–5.0)
Cholesterol, Total: 140 mg/dL (ref 100–199)
HDL: 41 mg/dL (ref >39)
LDL Chol Calc (NIH): 81 mg/dL (ref 0–99)
Triglycerides: 93 mg/dL (ref 0–149)
VLDL Cholesterol Cal: 18 mg/dL (ref 5–40)

## 2023-02-03 LAB — COMPREHENSIVE METABOLIC PANEL
ALT: 17 IU/L (ref 0–44)
AST: 18 IU/L (ref 0–40)
Albumin/Globulin Ratio: 1.8 (ref 1.2–2.2)
Albumin: 4.1 g/dL (ref 3.8–4.8)
Alkaline Phosphatase: 79 IU/L (ref 44–121)
BUN/Creatinine Ratio: 14 (ref 10–24)
BUN: 16 mg/dL (ref 8–27)
Bilirubin Total: 0.8 mg/dL (ref 0.0–1.2)
CO2: 23 mmol/L (ref 20–29)
Calcium: 8.9 mg/dL (ref 8.6–10.2)
Chloride: 104 mmol/L (ref 96–106)
Creatinine, Ser: 1.16 mg/dL (ref 0.76–1.27)
Globulin, Total: 2.3 g/dL (ref 1.5–4.5)
Glucose: 87 mg/dL (ref 70–99)
Potassium: 4.2 mmol/L (ref 3.5–5.2)
Sodium: 139 mmol/L (ref 134–144)
Total Protein: 6.4 g/dL (ref 6.0–8.5)
eGFR: 66 mL/min/{1.73_m2} (ref >59)

## 2023-02-03 LAB — CBC WITH DIFFERENTIAL/PLATELET
Basophils Absolute: 0.1 10*3/uL (ref 0.0–0.2)
Basos: 1 %
EOS (ABSOLUTE): 0.3 10*3/uL (ref 0.0–0.4)
Eos: 7 %
Hematocrit: 42.8 % (ref 37.5–51.0)
Hemoglobin: 14.7 g/dL (ref 13.0–17.7)
Immature Grans (Abs): 0 10*3/uL (ref 0.0–0.1)
Immature Granulocytes: 0 %
Lymphocytes Absolute: 1.4 10*3/uL (ref 0.7–3.1)
Lymphs: 31 %
MCH: 33.7 pg — ABNORMAL HIGH (ref 26.6–33.0)
MCHC: 34.3 g/dL (ref 31.5–35.7)
MCV: 98 fL — ABNORMAL HIGH (ref 79–97)
Monocytes Absolute: 0.5 10*3/uL (ref 0.1–0.9)
Monocytes: 11 %
Neutrophils Absolute: 2.3 10*3/uL (ref 1.4–7.0)
Neutrophils: 50 %
Platelets: 146 10*3/uL — ABNORMAL LOW (ref 150–450)
RBC: 4.36 x10E6/uL (ref 4.14–5.80)
RDW: 12.4 % (ref 11.6–15.4)
WBC: 4.6 10*3/uL (ref 3.4–10.8)

## 2023-02-03 MED ORDER — ATORVASTATIN CALCIUM 40 MG PO TABS
40.0000 mg | ORAL_TABLET | Freq: Every day | ORAL | 3 refills | Status: DC
Start: 2023-02-03 — End: 2024-01-18

## 2023-02-03 NOTE — Addendum Note (Signed)
Addended by: [name] on: 02/03/2023 07:03 AM   Modules accepted: Orders

## 2023-02-17 ENCOUNTER — Ambulatory Visit: Payer: No Typology Code available for payment source | Admitting: Family Medicine

## 2023-02-18 ENCOUNTER — Telehealth: Payer: No Typology Code available for payment source | Admitting: Nurse Practitioner

## 2023-02-18 ENCOUNTER — Encounter: Payer: Self-pay | Admitting: Nurse Practitioner

## 2023-02-18 VITALS — Wt 203.0 lb

## 2023-02-18 DIAGNOSIS — H8109 Meniere's disease, unspecified ear: Secondary | ICD-10-CM

## 2023-02-18 MED ORDER — POTASSIUM CHLORIDE ER 10 MEQ PO TBCR
EXTENDED_RELEASE_TABLET | ORAL | 5 refills | Status: DC
Start: 2023-02-18 — End: 2023-11-18

## 2023-02-18 MED ORDER — CHLORTHALIDONE 25 MG PO TABS: 25.0000 mg | ORAL_TABLET | Freq: Every day | ORAL | 5 refills | Status: DC

## 2023-02-18 NOTE — Progress Notes (Signed)
Virtual Visit Encounter mychart visit.   I connected with  Rosita Kea on 03/17/23 at  3:30 PM EDT by secure video and audio telemedicine application. I verified that I am speaking with the correct person using two identifiers.   I introduced myself as a Publishing rights manager with the practice. The limitations of evaluation and management by telemedicine discussed with the patient and the availability of in person appointments. The patient expressed verbal understanding and consent to proceed.  Participating parties in this visit include: Myself and patient  The patient is: Patient Location: Home I am: Provider Location: Office/Clinic Subjective:    CC and HPI: Zhaire Locker is a 77 y.o. year old male presenting for follow up of meniers. Patient reports the following:  Ringing in the right ear started the day after he was seen for his wellness visit. Has a long standing history of this but effectively treated with chlorthalidone and potassium. He has not had a flair in about a year. He would like to restart the medication. He has no neurological symptoms present.   Past medical history, Surgical history, Family history not pertinant except as noted below, Social history, Allergies, and medications have been entered into the medical record, reviewed, and corrections made.   Review of Systems:  All review of systems negative except what is listed in the HPI  Objective:    Alert and oriented x 4 Speaking in clear sentences with no shortness of breath. No distress.  Impression and Recommendations:    Problem List Items Addressed This Visit     Meniere's disease - Primary    Longstanding history of Meniere's disease with current flair. Previous successful treatment with chlorthalidone and potassium replacement. No alarm symptoms present at this time.  Plan: - Will restart previous medications and monitor - If no improvement in symptoms please follow-up.  - Labs in 3 months or  sooner if needed to monitor K.       Relevant Medications   chlorthalidone (HYGROTON) 25 MG tablet   potassium chloride (KLOR-CON) 10 MEQ tablet    orders and follow up as documented in EMR I discussed the assessment and treatment plan with the patient. The patient was provided an opportunity to ask questions and all were answered. The patient agreed with the plan and demonstrated an understanding of the instructions.   The patient was advised to call back or seek an in-person evaluation if the symptoms worsen or if the condition fails to improve as anticipated.  Follow-Up: prn  I provided 15 minutes of non-face-to-face interaction with this non face-to-face encounter including intake, same-day documentation, and chart review.   Tollie Eth, NP , DNP, AGNP-c [facility]

## 2023-03-01 DIAGNOSIS — R350 Frequency of micturition: Secondary | ICD-10-CM | POA: Diagnosis not present

## 2023-03-01 DIAGNOSIS — R3915 Urgency of urination: Secondary | ICD-10-CM | POA: Diagnosis not present

## 2023-03-01 DIAGNOSIS — N401 Enlarged prostate with lower urinary tract symptoms: Secondary | ICD-10-CM | POA: Diagnosis not present

## 2023-03-01 DIAGNOSIS — R3912 Poor urinary stream: Secondary | ICD-10-CM | POA: Diagnosis not present

## 2023-03-17 NOTE — Assessment & Plan Note (Signed)
Longstanding history of Meniere's disease with current flair. Previous successful treatment with chlorthalidone and potassium replacement. No alarm symptoms present at this time.  Plan: - Will restart previous medications and monitor - If no improvement in symptoms please follow-up.  - Labs in 3 months or sooner if needed to monitor K.

## 2023-04-13 ENCOUNTER — Other Ambulatory Visit: Payer: Self-pay | Admitting: Family Medicine

## 2023-04-14 NOTE — Telephone Encounter (Signed)
Patient says he has to take Amoxicillin before any dental procedure. He had a dental procedure yesterday and took the 4 pills of Amoxicillin that was previously sent in a few months ago. Patient says he needs a refill to have on hand when he sees the dentist again in 6 months.

## 2023-06-08 DIAGNOSIS — Z008 Encounter for other general examination: Secondary | ICD-10-CM | POA: Diagnosis not present

## 2023-06-08 DIAGNOSIS — Z6826 Body mass index (BMI) 26.0-26.9, adult: Secondary | ICD-10-CM | POA: Diagnosis not present

## 2023-06-08 DIAGNOSIS — E663 Overweight: Secondary | ICD-10-CM | POA: Diagnosis not present

## 2023-06-08 DIAGNOSIS — D696 Thrombocytopenia, unspecified: Secondary | ICD-10-CM | POA: Diagnosis not present

## 2023-06-24 DIAGNOSIS — R350 Frequency of micturition: Secondary | ICD-10-CM | POA: Diagnosis not present

## 2023-06-24 DIAGNOSIS — R351 Nocturia: Secondary | ICD-10-CM | POA: Diagnosis not present

## 2023-06-24 DIAGNOSIS — N401 Enlarged prostate with lower urinary tract symptoms: Secondary | ICD-10-CM | POA: Diagnosis not present

## 2023-10-19 ENCOUNTER — Other Ambulatory Visit: Payer: Self-pay | Admitting: Family Medicine

## 2023-10-19 NOTE — Telephone Encounter (Signed)
Is this ok to refill?

## 2023-11-11 NOTE — Progress Notes (Signed)
 Patient ID: Andrew Ayala, male   DOB: 04/05/1946, 78 y.o.   MRN: [id number]    Cardiology Office Note   Date:  11/18/2023   ID:  Andrew Ayala, DOB 10-Jun-1946, MRN [id number]  PCP:  Andrew Nian, MD  Cardiologist:   [name], MD   No chief complaint on file.     History of Present Illness: Andrew Ayala is a 78 y.o. male who presents for f/u MV repair 2013 Had bi leaflet prolapse, Dr Cornelius Moras 02/27/15 triangular resection, [place] neochord and sorin Memo 34 mm ring   Most recent echo 09/01/18 with no residual MR normal EF 60-65% no LAE and peak diastolic gradient Through valve only 3 mmHg   Son is a psychiatrist in [place] and his male partner are thinking of adopting  Has had low BP especially when Rx with meclizine, diuretic and flomax for prostatism and menieres   Had atypical chest pain 05/15/20 pleuritic with negative ER w/u Including CXR just COPD and no acute ECG changes negative troponin   Wife has had 2 knee replacements  He is active walking his terrier mix "Elly" with no chest pain   Most of his issues now are non cardiac with prostatism , colonic polyps and Dupuytren's contracture and RLS now on Mirapex  Had in office cystoscopy with [place] today  Has had some dysphagia with food getting stuck 3 times  Has had 3 episodes of chest pain while working out Seem muscular  But SSCP with exertion lasting minutes resolved with rest   3/`5/23 Cardiac CTA with calcium score only 146 , 42 nd percentile and no obstructive CAD    Past Medical History:  Diagnosis Date   Acne rosacea    Allergic rhinitis    Allergy    Arthritis    FINGER,RIGHT HAND SMALL FINGER   Asthma    AS A CHILD   BPH (benign prostatic hyperplasia)    Broken leg 1951   right leg   Cataract    BEGINNING STAGES,BILATERAL   Chalazion, bilateral    lower lids   Dupuytren's contracture    BILATERAL HANDS   GERD (gastroesophageal reflux disease)    no meds/some reflux/takes tums   Heart murmur     Hyperlipidemia    MVP (mitral valve prolapse)    repaired   S/P minimally invasive mitral valve repair 02/27/2015   Complex valvuloplasty including triangular resection of flail segment of posterior leaflet, artificial Gore-tex neochord placement x8 and 34 mm Sorin Memo 3D Rechord ring annuloplasty   Sensory - neural hearing loss    Severe mitral regurgitation 10/05/2014   Vasomotor rhinitis     Past Surgical History:  Procedure Laterality Date   BROKEN LEG Right    AGE 18   CARDIAC CATHETERIZATION N/A 02/08/2015   Procedure: Left Heart Cath And Coronary Angiography;  Surgeon: Wendall Stade, MD;  Location: [facility] INVASIVE CV LAB;  Service: Cardiovascular;  Laterality: N/A;   COLONOSCOPY  2007   Dr. Marina Goodell   HAND SURGERY  2001   right   MITRAL VALVE REPAIR Right 02/27/2015   Procedure: MINIMALLY INVASIVE MITRAL VALVE REPAIR (MVR);  Surgeon: Purcell Nails, MD;  Location: [facility] OR;  Service: Open Heart Surgery;  Laterality: Right;   NASAL SEPTUM SURGERY  1988   POLYPECTOMY     SEPTOPLASTY  1998   TEE WITHOUT CARDIOVERSION N/A 10/03/2014   Procedure: TRANSESOPHAGEAL ECHOCARDIOGRAM (TEE);  Surgeon: Wendall Stade, MD;  Location: [facility] ENDOSCOPY;  Service: Cardiovascular;  Laterality: N/A;   TEE WITHOUT CARDIOVERSION N/A 02/27/2015   Procedure: TRANSESOPHAGEAL ECHOCARDIOGRAM (TEE);  Surgeon: Purcell Nails, MD;  Location: [facility] OR;  Service: Open Heart Surgery;  Laterality: N/A;   undescended testicle  1951   wart removal  1997   cryo surgery (penis)   WISDOM TOOTH EXTRACTION  1994     Current Outpatient Medications  Medication Sig Dispense Refill   alfuzosin (UROXATRAL) 10 MG 24 hr tablet Take 10 mg by mouth daily.     amoxicillin (AMOXIL) 500 MG capsule TAKE PRIOR TO DENTAL PROCEDURE 4 capsule 0   aspirin EC 81 MG tablet Take 81 mg by mouth daily before supper.     atorvastatin (LIPITOR) 40 MG tablet Take 1 tablet (40 mg total) by mouth daily. 90 tablet 3   Eyelid Cleansers (OCUSOFT LID  SCRUB EX) Apply topically 2 (two) times daily.      finasteride (PROSCAR) 5 MG tablet Take 1 tablet (5 mg total) by mouth daily. 90 tablet 3   GLUCOSAMINE-CHONDROITIN PO Take 1 tablet by mouth daily. Glucosamine 1500 mg/ chrondroiton 1200 mg     ketoconazole (NIZORAL) 2 % shampoo Apply 1 application topically 2 (two) times a week.     Multiple Vitamins-Minerals (MULTIVITAMIN WITH MINERALS) tablet Take 1 tablet by mouth daily.     Omega-3 Fatty Acids (FISH OIL) 1200 MG CAPS Take 1 capsule by mouth at bedtime.     pramipexole (MIRAPEX) 0.125 MG tablet TAKE 1 TABLET BY MOUTH EVERYDAY AT BEDTIME 90 tablet 3   VITAMIN D PO Take 1 capsule by mouth daily at 6 (six) AM.     No current facility-administered medications for this visit.    Allergies:   Patient has no known allergies.    Social History:  The patient  reports that he has quit smoking. He has been exposed to tobacco smoke. He has never used smokeless tobacco. He reports current alcohol use. He reports that he does not use drugs.   Family History:  The patient's family history includes Colon cancer (age of onset: 37) in his paternal uncle; Colon cancer (age of onset: 50) in his paternal uncle; Heart attack in his father; Heart failure in his mother; Pancreatic cancer in his paternal grandfather and paternal grandmother; Stroke in his sister.    ROS:  Please see the history of present illness.   Otherwise, review of systems are positive for none.   All other systems are reviewed and negative.    PHYSICAL EXAM: VS:  BP 104/72   Pulse 60   Ht 6\' 2"  (1.88 m)   Wt 208 lb 6.4 oz (94.5 kg)   SpO2 96%   BMI 26.76 kg/m  , BMI Body mass index is 26.76 kg/m. Affect appropriate Healthy:  appears stated age HEENT: normal Neck supple with no adenopathy JVP normal no bruits no thyromegaly Lungs clear with no wheezing and good diaphragmatic motion Heart:  S1/S2 no murmur, no rub, gallop or click post mini right thoracotomy PMI  normal Abdomen: benighn, BS positve, no tenderness, no AAA no bruit.  No HSM or HJR Distal pulses intact with no bruits No edema Neuro non-focal Skin warm and dry Left DP surgery scar new old on right palm    EKG:  11/18/2023 NSR rate 69 normal    Recent Labs: 02/02/2023: ALT 17; BUN 16; Creatinine, Ser 1.16; Hemoglobin 14.7; Platelets 146; Potassium 4.2; Sodium 139    Lipid Panel    Component Value Date/Time  CHOL 140 02/02/2023 0948   TRIG 93 02/02/2023 0948   HDL 41 02/02/2023 0948   CHOLHDL 3.4 02/02/2023 0948   CHOLHDL 4.1 11/20/2016 1048   VLDL 27 11/20/2016 1048   LDLCALC 81 02/02/2023 0948      Wt Readings from Last 3 Encounters:  11/18/23 208 lb 6.4 oz (94.5 kg)  02/18/23 203 lb (92.1 kg)  02/02/23 203 lb 6.4 oz (92.3 kg)      Other studies Reviewed: Additional studies/ records that were reviewed today include: TEE/TTE., Cath Intraop TEE and OR report    Plan:   MV repair:   No MR on exam  Echo 09/01/18 with no residual MR SBE prophylaxis  Update TTE  Chol:  Cholesterol is at goal.  Continue current dose of statin and diet Rx.  No myalgias or side effects.  F/U  LFT's in 6 months        Meniere's  :  F/u [place] Seems to be improving on chronic diuretic   Urology:  On flomax/proscar  for hesitancy and frequent urination at night f/u with primary for PSA   Hypotension:  Not cardiac a bit better off flomax   Chest Pain:  Seems to have been muscular cardiac CTA 12/03/21 no obstructive CAD Calcium score only 146, 42 nd percentile   Restless Leg:  syndrome improved with Mirapex f/u primary   Dysphagia:  contact Dr Susann Givens primary suspect he should have EGD.  Will be able to visualized esophagus some on CT above   TTE for MVR  F/U in a year if    Signed, [name], MD  11/18/2023 2:24 PM    [facility] [address] [place], [place], [place]  [address] Phone: [phone]; Fax: [phone]

## 2023-11-16 ENCOUNTER — Encounter: Payer: Self-pay | Admitting: Internal Medicine

## 2023-11-16 DIAGNOSIS — L538 Other specified erythematous conditions: Secondary | ICD-10-CM | POA: Diagnosis not present

## 2023-11-16 DIAGNOSIS — D235 Other benign neoplasm of skin of trunk: Secondary | ICD-10-CM | POA: Diagnosis not present

## 2023-11-16 DIAGNOSIS — L814 Other melanin hyperpigmentation: Secondary | ICD-10-CM | POA: Diagnosis not present

## 2023-11-16 DIAGNOSIS — L821 Other seborrheic keratosis: Secondary | ICD-10-CM | POA: Diagnosis not present

## 2023-11-16 DIAGNOSIS — D492 Neoplasm of unspecified behavior of bone, soft tissue, and skin: Secondary | ICD-10-CM | POA: Diagnosis not present

## 2023-11-16 DIAGNOSIS — D225 Melanocytic nevi of trunk: Secondary | ICD-10-CM | POA: Diagnosis not present

## 2023-11-18 ENCOUNTER — Ambulatory Visit
Payer: No Typology Code available for payment source | Attending: Cardiovascular Disease | Admitting: Cardiovascular Disease

## 2023-11-18 VITALS — BP 104/72 | HR 60 | Ht 74.0 in | Wt 208.4 lb

## 2023-11-18 DIAGNOSIS — Z9889 Other specified postprocedural states: Secondary | ICD-10-CM

## 2023-11-18 DIAGNOSIS — R351 Nocturia: Secondary | ICD-10-CM | POA: Diagnosis not present

## 2023-11-18 DIAGNOSIS — E782 Mixed hyperlipidemia: Secondary | ICD-10-CM | POA: Diagnosis not present

## 2023-11-18 DIAGNOSIS — I951 Orthostatic hypotension: Secondary | ICD-10-CM

## 2023-11-18 DIAGNOSIS — N401 Enlarged prostate with lower urinary tract symptoms: Secondary | ICD-10-CM | POA: Diagnosis not present

## 2023-11-18 NOTE — Patient Instructions (Signed)
 Medication Instructions:  Your physician recommends that you continue on your current medications as directed. Please refer to the Current Medication list given to you today.  *If you need a refill on your cardiac medications before your next appointment, please call your pharmacy*  Lab Work: If you have labs (blood work) drawn today and your tests are completely normal, you will receive your results only by: MyChart Message (if you have MyChart) OR A paper copy in the mail If you have any lab test that is abnormal or we need to change your treatment, we will call you to review the results.   Testing/Procedures: Your physician has requested that you have an echocardiogram. Echocardiography is a painless test that uses sound waves to create images of your heart. It provides your doctor with information about the size and shape of your heart and how well your heart's chambers and valves are working. This procedure takes approximately one hour. There are no restrictions for this procedure. Please do NOT wear cologne, perfume, aftershave, or lotions (deodorant is allowed). Please arrive 15 minutes prior to your appointment time.  Please note: We ask at that you not bring children with you during ultrasound (echo/ vascular) testing. Due to room size and safety concerns, children are not allowed in the ultrasound rooms during exams. Our front office staff cannot provide observation of children in our lobby area while testing is being conducted. An adult accompanying a patient to their appointment will only be allowed in the ultrasound room at the discretion of the ultrasound technician under special circumstances. We apologize for any inconvenience. Follow-Up: At [facility], you and your health needs are our priority.  As part of our continuing mission to provide you with exceptional heart care, we have created designated Provider Care Teams.  These Care Teams include your primary  Cardiologist (physician) and Advanced Practice Providers (APPs -  Physician Assistants and Nurse Practitioners) who all work together to provide you with the care you need, when you need it.  We recommend signing up for the patient portal called "MyChart".  Sign up information is provided on this After Visit Summary.  MyChart is used to connect with patients for Virtual Visits (Telemedicine).  Patients are able to view lab/test results, encounter notes, upcoming appointments, etc.  Non-urgent messages can be sent to your provider as well.   To learn more about what you can do with MyChart, go to [email or web address].    Your next appointment:   1 year(s)  Provider:   [name], MD     Other Instructions    1st Floor: - Lobby - Registration  - Pharmacy  - Lab - Cafe  2nd Floor: - PV Lab - Diagnostic Testing (echo, CT, nuclear med)  3rd Floor: - Vacant  4th Floor: - TCTS (cardiothoracic surgery) - AFib Clinic - Structural Heart Clinic - Vascular Surgery  - Vascular Ultrasound  5th Floor: - [facility] (general and EP) - Clinical Pharmacy for coumadin, hypertension, lipid, weight-loss medications, and med management appointments    Valet parking services will be available as well.

## 2023-12-01 ENCOUNTER — Encounter: Payer: Self-pay | Admitting: Family Medicine

## 2023-12-01 ENCOUNTER — Ambulatory Visit: Admitting: Family Medicine

## 2023-12-01 VITALS — BP 120/74 | HR 56 | Wt 205.2 lb

## 2023-12-01 DIAGNOSIS — S39012A Strain of muscle, fascia and tendon of lower back, initial encounter: Secondary | ICD-10-CM | POA: Diagnosis not present

## 2023-12-01 NOTE — Patient Instructions (Signed)
 Heat for 20 minutes 3 times per day with gentle stretching after that and you can also take Tylenol for pain relief if it is a lot of trouble.  The other thing to do is proper posturing

## 2023-12-01 NOTE — Progress Notes (Signed)
   Subjective:    Patient ID: Andrew Ayala, male    DOB: 12/20/1945, 78 y.o.   MRN: [id number]  HPI He has been having difficulty over the last several weeks with right thoracic discomfort.  He relates this to playing pickle ball several weeks ago and subsequently doing a lot more physical activities.  The pain is worse with certain motions especially lateral to the left.  No chest pain or shortness of breath, nausea vomiting.   Review of Systems     Objective:    Physical Exam Slight palpable tenderness to the right paravertebral muscles lower thoracic upper lumbar area.  Good motion of the back.  No palpable rib tenderness.       Assessment & Plan:  Back strain, initial encounter I explained that this is most likely musculoskeletal.  Recommend heat, stretching and Tylenol.  Discussed maintaining physical activities put isolating that particular part of the body when he does exercise.  He expressed understanding of this.

## 2023-12-08 DIAGNOSIS — H43813 Vitreous degeneration, bilateral: Secondary | ICD-10-CM | POA: Diagnosis not present

## 2023-12-09 ENCOUNTER — Ambulatory Visit (HOSPITAL_COMMUNITY): Payer: No Typology Code available for payment source | Attending: Cardiovascular Disease

## 2023-12-09 DIAGNOSIS — Z9889 Other specified postprocedural states: Secondary | ICD-10-CM | POA: Diagnosis not present

## 2023-12-09 DIAGNOSIS — E782 Mixed hyperlipidemia: Secondary | ICD-10-CM | POA: Insufficient documentation

## 2023-12-09 DIAGNOSIS — I951 Orthostatic hypotension: Secondary | ICD-10-CM | POA: Insufficient documentation

## 2023-12-23 DIAGNOSIS — E663 Overweight: Secondary | ICD-10-CM | POA: Diagnosis not present

## 2023-12-23 DIAGNOSIS — Z008 Encounter for other general examination: Secondary | ICD-10-CM | POA: Diagnosis not present

## 2023-12-23 DIAGNOSIS — E785 Hyperlipidemia, unspecified: Secondary | ICD-10-CM | POA: Diagnosis not present

## 2023-12-23 DIAGNOSIS — Z6826 Body mass index (BMI) 26.0-26.9, adult: Secondary | ICD-10-CM | POA: Diagnosis not present

## 2023-12-23 DIAGNOSIS — G2581 Restless legs syndrome: Secondary | ICD-10-CM | POA: Diagnosis not present

## 2023-12-27 LAB — ECHOCARDIOGRAM COMPLETE
Area-P 1/2: 1.67 cm2
MV VTI: 1.28 cm2
S' Lateral: 2.9 cm

## 2024-01-18 ENCOUNTER — Telehealth: Payer: Self-pay | Admitting: Family Medicine

## 2024-01-18 MED ORDER — ATORVASTATIN CALCIUM 40 MG PO TABS: 40.0000 mg | ORAL_TABLET | Freq: Every day | ORAL | 3 refills | Status: DC

## 2024-01-18 NOTE — Telephone Encounter (Signed)
 Atorvastatin  out of refills   needs 90 day supply Has upcoming appt

## 2024-01-25 ENCOUNTER — Other Ambulatory Visit: Payer: Self-pay | Admitting: Family Medicine

## 2024-01-25 DIAGNOSIS — N401 Enlarged prostate with lower urinary tract symptoms: Secondary | ICD-10-CM

## 2024-02-22 DIAGNOSIS — G2581 Restless legs syndrome: Secondary | ICD-10-CM | POA: Insufficient documentation

## 2024-02-23 ENCOUNTER — Ambulatory Visit (INDEPENDENT_AMBULATORY_CARE_PROVIDER_SITE_OTHER): Payer: No Typology Code available for payment source | Admitting: Family Medicine

## 2024-02-23 ENCOUNTER — Encounter: Payer: Self-pay | Admitting: Family Medicine

## 2024-02-23 VITALS — BP 117/72 | HR 83 | Ht 74.0 in | Wt 200.8 lb

## 2024-02-23 DIAGNOSIS — Z9889 Other specified postprocedural states: Secondary | ICD-10-CM | POA: Diagnosis not present

## 2024-02-23 DIAGNOSIS — M720 Palmar fascial fibromatosis [Dupuytren]: Secondary | ICD-10-CM | POA: Diagnosis not present

## 2024-02-23 DIAGNOSIS — H8109 Meniere's disease, unspecified ear: Secondary | ICD-10-CM

## 2024-02-23 DIAGNOSIS — Z23 Encounter for immunization: Secondary | ICD-10-CM | POA: Diagnosis not present

## 2024-02-23 DIAGNOSIS — N5201 Erectile dysfunction due to arterial insufficiency: Secondary | ICD-10-CM

## 2024-02-23 DIAGNOSIS — R351 Nocturia: Secondary | ICD-10-CM | POA: Diagnosis not present

## 2024-02-23 DIAGNOSIS — Z860100 Personal history of colon polyps, unspecified: Secondary | ICD-10-CM

## 2024-02-23 DIAGNOSIS — H25013 Cortical age-related cataract, bilateral: Secondary | ICD-10-CM | POA: Diagnosis not present

## 2024-02-23 DIAGNOSIS — L720 Epidermal cyst: Secondary | ICD-10-CM

## 2024-02-23 DIAGNOSIS — G2581 Restless legs syndrome: Secondary | ICD-10-CM | POA: Diagnosis not present

## 2024-02-23 DIAGNOSIS — N401 Enlarged prostate with lower urinary tract symptoms: Secondary | ICD-10-CM | POA: Diagnosis not present

## 2024-02-23 DIAGNOSIS — Z0000 Encounter for general adult medical examination without abnormal findings: Secondary | ICD-10-CM | POA: Diagnosis not present

## 2024-02-23 DIAGNOSIS — E785 Hyperlipidemia, unspecified: Secondary | ICD-10-CM

## 2024-02-23 DIAGNOSIS — J309 Allergic rhinitis, unspecified: Secondary | ICD-10-CM

## 2024-02-23 DIAGNOSIS — Z8249 Family history of ischemic heart disease and other diseases of the circulatory system: Secondary | ICD-10-CM

## 2024-02-23 LAB — LIPID PANEL

## 2024-02-23 MED ORDER — ATORVASTATIN CALCIUM 40 MG PO TABS: 40.0000 mg | ORAL_TABLET | Freq: Every day | ORAL | 3 refills | Status: AC

## 2024-02-23 MED ORDER — FINASTERIDE 5 MG PO TABS: 5.0000 mg | ORAL_TABLET | Freq: Every day | ORAL | 3 refills | Status: AC

## 2024-02-23 MED ORDER — ALFUZOSIN HCL ER 10 MG PO TB24: 10.0000 mg | ORAL_TABLET | Freq: Every day | ORAL | 3 refills | Status: AC

## 2024-02-23 MED ORDER — PRAMIPEXOLE DIHYDROCHLORIDE 0.125 MG PO TABS: ORAL_TABLET | ORAL | 3 refills | Status: AC

## 2024-02-23 NOTE — Progress Notes (Signed)
 Complete physical exam  Patient: Andrew Ayala   DOB: 1945-10-28   78 y.o. Male  MRN: [id number]  Subjective:     Chief Complaint  Patient presents with   Annual Exam    Cpe. Fasting. Injured middle finger left hand.     Andrew Ayala is a 78 y.o. male who presents today for an annual wellness visit and complete physical exam.  He reports consuming a general diet. Gym/ health club routine includes 2 or 3 times a week also participates in yard work. . He generally feels well. He reports sleeping well.  He does have a lesion on his left third finger that he would like me to look at.  He is also noted some increased difficulty with Dupuytren's contracture but at this point is not really interested in further intervention.  He does have RLS and continues on Mirapex  with good results.  He is having no difficulty from Mnire's disease.  He has had a colonoscopy and is waiting for the results of the path test.  Does have a history of ADD but at this point that is not a major issue in his life.  He does have cataracts and is being monitored for this and will eventually need surgery.  His allergies seem to be under good control.  He continues on atorvastatin .  He is also taking finasteride  alfuzosin for his prostate related symptoms.  His allergies seem to be under good control. Most recent fall risk assessment:    02/23/2024    9:45 AM  Fall Risk   Falls in the past year? 0  Number falls in past yr: 0  Injury with Fall? 0  Risk for fall due to : No Fall Risks  Follow up Falls evaluation completed   He does have an advanced directive Most recent depression screenings:    02/23/2024    9:46 AM 02/02/2023    2:04 PM  PHQ 2/9 Scores  PHQ - 2 Score 0 0    Vision:within the last year and Dental: No current dental problems and Receives regular dental care    Immunization History  Administered Date(s) Administered   DT (Pediatric) 01/15/1998, 05/27/2007   Influenza Split 07/14/2001,  09/25/2003, 06/18/2012, 07/05/2013, 06/19/2014   Influenza, High Dose Seasonal PF 06/12/2015, 05/12/2017, 05/27/2018, 05/28/2020, 05/08/2022   Influenza, Quadrivalent, Recombinant, Inj, Pf 06/20/2019   Influenza-Unspecified 06/06/2016, 06/09/2021   Moderna Covid-19 Fall Seasonal Vaccine 40yrs & older 07/06/2023   Moderna Sars-Covid-2 Vaccination 10/20/2019, 11/17/2019, 07/22/2020, 01/03/2021   PFIZER Comirnaty(Gray Top)Covid-19 Tri-Sucrose Vaccine 06/06/2022   PNEUMOCOCCAL CONJUGATE-20 02/23/2024   PPD Test 09/01/2001   Pfizer Covid-19 Vaccine Bivalent Booster 52yrs & up 07/02/2021   Pneumococcal Conjugate-13 08/25/2013   Pneumococcal Polysaccharide-23 05/17/2006   Respiratory Syncytial Virus Vaccine,Recomb Aduvanted(Arexvy) 10/09/2022   Tdap 05/27/2007, 12/02/2016   Zoster Recombinant(Shingrix) 06/25/2017, 10/25/2017   Zoster, Live 08/24/2008    Health Maintenance  Topic Date Due   COVID-19 Vaccine (8 - 2024-25 season) 01/04/2024   Medicare Annual Wellness (AWV)  02/02/2024   INFLUENZA VACCINE  04/21/2024   DTaP/Tdap/Td (5 - Td or Tdap) 12/03/2026   Colonoscopy  10/01/2027   Pneumonia Vaccine 42+ Years old  Completed   Hepatitis C Screening  Completed   Zoster Vaccines- Shingrix  Completed   HPV VACCINES  Aged Out   Meningococcal B Vaccine  Aged Out    Patient Care Team: Watson Hacking, MD as PCP - General Loyde Rule, MD as PCP - Cardiology (Cardiology)  Outpatient Medications Prior to Visit  Medication Sig   amoxicillin  (AMOXIL ) 500 MG capsule TAKE PRIOR TO DENTAL PROCEDURE   aspirin  EC 81 MG tablet Take 81 mg by mouth daily before supper.   Eyelid Cleansers (OCUSOFT LID SCRUB EX) Apply topically 2 (two) times daily.    GLUCOSAMINE-CHONDROITIN PO Take 1 tablet by mouth daily. Glucosamine 1500 mg/ chrondroiton 1200 mg   ketoconazole (NIZORAL) 2 % shampoo Apply 1 application topically 2 (two) times a week.   Multiple Vitamins-Minerals (MULTIVITAMIN WITH MINERALS)  tablet Take 1 tablet by mouth daily.   Omega-3 Fatty Acids (FISH OIL) 1200 MG CAPS Take 1 capsule by mouth at bedtime.   VITAMIN D PO Take 1 capsule by mouth daily at 6 (six) AM.   [DISCONTINUED] alfuzosin (UROXATRAL) 10 MG 24 hr tablet Take 10 mg by mouth daily.   [DISCONTINUED] atorvastatin  (LIPITOR) 40 MG tablet Take 1 tablet (40 mg total) by mouth daily.   [DISCONTINUED] finasteride  (PROSCAR ) 5 MG tablet TAKE 1 TABLET (5 MG TOTAL) BY MOUTH DAILY.   [DISCONTINUED] pramipexole  (MIRAPEX ) 0.125 MG tablet TAKE 1 TABLET BY MOUTH EVERYDAY AT BEDTIME   No facility-administered medications prior to visit.    Review of Systems  All other systems reviewed and are negative.   Family and social history as well as health maintenance and immunizations was reviewed.     Objective:    BP 117/72   Pulse 83   Ht 6\' 2"  (1.88 m)   Wt 200 lb 12.8 oz (91.1 kg)   SpO2 95%   BMI 25.78 kg/m    Physical Exam   Alert and in no distress. Tympanic membranes and canals are normal. Pharyngeal area is normal. Neck is supple without adenopathy or thyromegaly. Cardiac exam shows a regular sinus rhythm without murmurs or gallops. Lungs are clear to auscultation.  Exam of the left third distal finger does show what appears to be an inclusion cyst.      Assessment & Plan:     Routine general medical examination at a health care facility  S/P minimally invasive mitral valve repair - Plan: CBC with Differential/Platelet, Comprehensive metabolic panel with GFR  Meniere's disease, unspecified laterality  Hyperlipidemia LDL goal <100 - Plan: Lipid panel, atorvastatin  (LIPITOR) 40 MG tablet  Hx of colonic polyps  Erectile dysfunction due to arterial insufficiency - Plan: CBC with Differential/Platelet, Comprehensive metabolic panel with GFR  Cortical age-related cataract of both eyes  Benign prostatic hyperplasia with nocturia - Plan: alfuzosin (UROXATRAL) 10 MG 24 hr tablet  Allergic rhinitis,  mild  Family history of heart disease  Need for vaccination against Streptococcus pneumoniae - Plan: Pneumococcal conjugate vaccine 20-valent (Prevnar 20)  RLS (restless legs syndrome) - Plan: pramipexole  (MIRAPEX ) 0.125 MG tablet  Benign prostatic hyperplasia with lower urinary tract symptoms, symptom details unspecified - Plan: finasteride  (PROSCAR ) 5 MG tablet  Dupuytren's contracture  Inclusion cyst  I explained that the inclusion cyst is a benign lesion and nothing really needs to be done.  He will follow-up concerning his Dupuytren's on an as-needed basis.  He will also follow-up with ophthalmology.  Otherwise he will continue on his present medication regimen. Return in about 1 year (around 02/22/2025).      Ron Cobbs, MD

## 2024-02-24 ENCOUNTER — Ambulatory Visit: Payer: Self-pay | Admitting: Family Medicine

## 2024-02-24 LAB — LIPID PANEL
Cholesterol, Total: 129 mg/dL (ref 100–199)
HDL: 42 mg/dL (ref >39)
LDL CALC COMMENT:: 3.1 ratio (ref 0.0–5.0)
LDL Chol Calc (NIH): 74 mg/dL (ref 0–99)
Triglycerides: 61 mg/dL (ref 0–149)
VLDL Cholesterol Cal: 13 mg/dL (ref 5–40)

## 2024-02-24 LAB — COMPREHENSIVE METABOLIC PANEL WITH GFR
ALT: 22 IU/L (ref 0–44)
AST: 20 IU/L (ref 0–40)
Albumin: 4.1 g/dL (ref 3.8–4.8)
Alkaline Phosphatase: 95 IU/L (ref 44–121)
BUN/Creatinine Ratio: 16 (ref 10–24)
BUN: 17 mg/dL (ref 8–27)
Bilirubin Total: 0.7 mg/dL (ref 0.0–1.2)
CO2: 21 mmol/L (ref 20–29)
Calcium: 9.1 mg/dL (ref 8.6–10.2)
Chloride: 105 mmol/L (ref 96–106)
Creatinine, Ser: 1.07 mg/dL (ref 0.76–1.27)
Globulin, Total: 2.4 g/dL (ref 1.5–4.5)
Glucose: 89 mg/dL (ref 70–99)
Potassium: 4.5 mmol/L (ref 3.5–5.2)
Sodium: 141 mmol/L (ref 134–144)
Total Protein: 6.5 g/dL (ref 6.0–8.5)
eGFR: 72 mL/min/{1.73_m2} (ref >59)

## 2024-02-24 LAB — CBC WITH DIFFERENTIAL/PLATELET
Basophils Absolute: 0.1 10*3/uL (ref 0.0–0.2)
Basos: 1 %
EOS (ABSOLUTE): 0.2 10*3/uL (ref 0.0–0.4)
Eos: 5 %
Hematocrit: 43.4 % (ref 37.5–51.0)
Hemoglobin: 14.3 g/dL (ref 13.0–17.7)
Immature Grans (Abs): 0 10*3/uL (ref 0.0–0.1)
Immature Granulocytes: 0 %
Lymphocytes Absolute: 1.6 10*3/uL (ref 0.7–3.1)
Lymphs: 33 %
MCH: 33.3 pg — ABNORMAL HIGH (ref 26.6–33.0)
MCHC: 32.9 g/dL (ref 31.5–35.7)
MCV: 101 fL — ABNORMAL HIGH (ref 79–97)
Monocytes Absolute: 0.5 10*3/uL (ref 0.1–0.9)
Monocytes: 10 %
Neutrophils Absolute: 2.5 10*3/uL (ref 1.4–7.0)
Neutrophils: 51 %
Platelets: 149 10*3/uL — ABNORMAL LOW (ref 150–450)
RBC: 4.3 x10E6/uL (ref 4.14–5.80)
RDW: 12.2 % (ref 11.6–15.4)
WBC: 4.9 10*3/uL (ref 3.4–10.8)

## 2024-03-01 DIAGNOSIS — M71342 Other bursal cyst, left hand: Secondary | ICD-10-CM | POA: Diagnosis not present

## 2024-04-25 ENCOUNTER — Other Ambulatory Visit: Payer: Self-pay | Admitting: Family Medicine

## 2024-04-25 NOTE — Telephone Encounter (Signed)
 Is this okay to refill?

## 2024-09-28 ENCOUNTER — Ambulatory Visit (INDEPENDENT_AMBULATORY_CARE_PROVIDER_SITE_OTHER): Admitting: Family Medicine

## 2024-09-28 ENCOUNTER — Ambulatory Visit: Payer: Self-pay

## 2024-09-28 VITALS — BP 112/72 | HR 52 | Temp 97.3°F | Ht 74.0 in | Wt 200.8 lb

## 2024-09-28 DIAGNOSIS — J069 Acute upper respiratory infection, unspecified: Secondary | ICD-10-CM

## 2024-09-28 NOTE — Progress Notes (Signed)
 Chief Complaint  Patient presents with   Cough    Cough that started about 10 days ago. Some fever, chills and body aches. Laryngitis started about 3 days ago. No home testing has been done.    Patient presents for evaluation of cough and laryngitis, accompanied by his wife (since he can't speak). She is also sick. She saw Dr. Joyce on 12/22 and tested negative for flu and COVID. Patient got sick late December--started with cough and congestion. Started getting achey (mild/intermittent) and lost his voice 3 days ago. Never had fever.  Currently is complaining of severe cough, productive of phlegm, mostly gray in color. Cough is worse at night. Robitussin DM helps, symptoms recur every 4 hours when due for next dose. Denies nasal congestion or runny nose, no sinus pain. Has slight chest tightness (intermittently), but denies wheezing, shortness of breath. Currently has some discomfort in throat and ears.  Taking Robitussin DM, Xicam, Emergen-C. Took 400 mg of ibuprofen early this morning.   PMH, PSH, SH reviewed HLD, BPH, RLS, h/o valve repair  Outpatient Encounter Medications as of 09/28/2024  Medication Sig Note   alfuzosin  (UROXATRAL ) 10 MG 24 hr tablet Take 1 tablet (10 mg total) by mouth daily.    aspirin  EC 81 MG tablet Take 81 mg by mouth daily before supper.    atorvastatin  (LIPITOR) 40 MG tablet Take 1 tablet (40 mg total) by mouth daily.    dextromethorphan-guaiFENesin (ROBITUSSIN-DM) 10-100 MG/5ML liquid Take 10 mLs by mouth every 4 (four) hours as needed for cough. 09/28/2024: Last dose this am 9am   Eyelid Cleansers (OCUSOFT LID SCRUB EX) Apply topically 2 (two) times daily.  02/02/2023: As needed   finasteride  (PROSCAR ) 5 MG tablet Take 1 tablet (5 mg total) by mouth daily.    GLUCOSAMINE-CHONDROITIN PO Take 1 tablet by mouth daily. Glucosamine 1500 mg/ chrondroiton 1200 mg    Homeopathic Products (ZICAM COLD REMEDY NA) Place into the nose. 09/28/2024: Last dose 8am    ibuprofen (ADVIL) 200 MG tablet Take 400 mg by mouth every 6 (six) hours as needed. 09/28/2024: 400mg  last night   ketoconazole (NIZORAL) 2 % shampoo Apply 1 application topically 2 (two) times a week. 09/28/2024: As needed   Multiple Vitamins-Minerals (EMERGEN-C VITAMIN C LITE PO) Take 1 Package by mouth as needed. 09/28/2024: Last dose 4am   Multiple Vitamins-Minerals (MULTIVITAMIN WITH MINERALS) tablet Take 1 tablet by mouth daily.    Omega-3 Fatty Acids (FISH OIL) 1200 MG CAPS Take 1 capsule by mouth at bedtime.    pramipexole  (MIRAPEX ) 0.125 MG tablet TAKE 1 TABLET BY MOUTH EVERYDAY AT BEDTIME    VITAMIN D PO Take 1 capsule by mouth daily at 6 (six) AM.    amoxicillin  (AMOXIL ) 500 MG capsule TAKE PRIOR TO DENTAL PROCEDURE (Patient not taking: Reported on 09/28/2024) 09/28/2024: As needed   No facility-administered encounter medications on file as of 09/28/2024.    ROS: no f/c, URI symptoms per HPI. No CP, SOB, rash, n/v/d. Heartburn when he eats garlic, occasional Tums. No urinary complaints/changes or other concerns.    PHYSICAL EXAM:  BP 112/72   Pulse (!) 52   Temp (!) 97.3 F (36.3 C) (Tympanic)   Ht 6' 2 (1.88 m)   Wt 200 lb 12.8 oz (91.1 kg)   BMI 25.78 kg/m   Pleasant, well-appearing male, whispering. No coughing noted during visit. Occ sniffle HEENT: conjunctiva and sclera are clear, EOMI. Nonobstructive cerumen in the L ear, clear on the right Sinuses  are nontender. Nasal mucosa is mildly edematous, with clear mucus on the right. OP is clear. Neck: soft tissue mass vs LN on left--soft, nontender and longstanding, per pt No other masses, tenderness or lymphadenopathy Heart: some ectopy initially, then regular rhythm, normal rate (60), no murmur Lungs: clear bilaterally, no wheezes, rales, ronchi Abdomen: soft, nontender Neuro: alert and oriented, cranial nerves grossly intact, normal gait Psych: normal mood, affect, hygiene and grooming   ASSESSMENT/PLAN:  Viral upper  respiratory illness - s/sx bacterial infection reviewed, to contact us  for ABX or return for eval. Supportive measures reviewed.  Pharmacy--[facility] [address]   Stay well hydrated. Switch to Mucinex DM 12 hour tablets (or the generic)--this is the same ingredients as Robitussin DM, butin a 12 hour pill. You may want to take something to dry up the nasal drainage, as I suspect that postnasal drainage is contributing to cough and hoarseness.  You can take a medication such as sudafed (decongestant) or try and antihistamine like your wife's allertec.  Contact us  if you develop fever, if your phlegm becomes more yellow or green, if you develop pain with breathing, shortness of breath. If you are feeling worse, you may need to be re-evaluated.

## 2024-09-28 NOTE — Patient Instructions (Signed)
" °  Stay well hydrated. Switch to Mucinex DM 12 hour tablets (or the generic)--this is the same ingredients as Robitussin DM, butin a 12 hour pill. You may want to take something to dry up the nasal drainage, as I suspect that postnasal drainage is contributing to cough and hoarseness.  You can take a medication such as sudafed (decongestant) or try and antihistamine like your wife's allertec.  Contact us  if you develop fever, if your phlegm becomes more yellow or green, if you develop pain with breathing, shortness of breath. If you are feeling worse, you may need to be re-evaluated. "

## 2024-09-28 NOTE — Telephone Encounter (Signed)
 FYI Only or Action Required?: FYI only for provider: appointment scheduled on today.  Patient was last seen in primary care on 02/23/2024 by Joyce Norleen BROCKS, MD.  Called Nurse Triage reporting Cough.  Symptoms began several weeks ago.  Interventions attempted: Rest, hydration, or home remedies.  Symptoms are: unchanged.  Triage Disposition: See Physician Within 24 Hours  Patient/caregiver understands and will follow disposition?: Yes, will follow disposition  Copied from CRM #[id number]. Topic: Clinical - Red Word Triage >> Sep 28, 2024  8:44 AM Amy B wrote: Red Word that prompted transfer to Nurse Triage: Severe cough, congestion, shortness of breath Reason for Disposition  SEVERE coughing spells (e.g., whooping sound after coughing, vomiting after coughing)  Answer Assessment - Initial Assessment Questions 1. ONSET: When did the cough begin?      For awhile 2. SEVERITY: How bad is the cough today?      severe 3. SPUTUM: Describe the color of your sputum (e.g., none, dry cough; clear, white, yellow, green)     Grey/green 4. HEMOPTYSIS: Are you coughing up any blood? If Yes, ask: How much? (e.g., flecks, streaks, tablespoons, etc.)     denies 5. DIFFICULTY BREATHING: Are you having difficulty breathing? If Yes, ask: How bad is it? (e.g., mild, moderate, severe)      Not really SOB, states stuffy 6. FEVER: Do you have a fever? If Yes, ask: What is your temperature, how was it measured, and when did it start?     denies 10. OTHER SYMPTOMS: Do you have any other symptoms? (e.g., runny nose, wheezing, chest pain)       Minor sore throat, lymph node swelling  Protocols used: Cough - Acute Productive-A-AH

## 2024-10-01 ENCOUNTER — Encounter: Payer: Self-pay | Admitting: Family Medicine

## 2024-10-02 ENCOUNTER — Encounter: Payer: Self-pay | Admitting: Family Medicine

## 2024-10-02 MED ORDER — AMOXICILLIN-POT CLAVULANATE 875-125 MG PO TABS: 1.0000 | ORAL_TABLET | Freq: Two times a day (BID) | ORAL | 0 refills | Status: AC

## 2024-10-03 ENCOUNTER — Ambulatory Visit: Admitting: Family Medicine

## 2025-01-11 ENCOUNTER — Ambulatory Visit: Admitting: Cardiovascular Disease

## 2025-01-26 ENCOUNTER — Ambulatory Visit: Admitting: Cardiovascular Disease

## 2025-03-07 ENCOUNTER — Ambulatory Visit: Payer: Self-pay | Admitting: Family Medicine
